# Patient Record
Sex: Female | Born: 1960
Health system: Southern US, Community
[De-identification: ages and names within clinical notes are randomized; demographics above are authoritative.]

## PROBLEM LIST (undated history)

## (undated) DIAGNOSIS — G500 Trigeminal neuralgia: Secondary | ICD-10-CM

## (undated) DIAGNOSIS — I10 Essential (primary) hypertension: Secondary | ICD-10-CM

## (undated) DIAGNOSIS — E7800 Pure hypercholesterolemia, unspecified: Secondary | ICD-10-CM

## (undated) DIAGNOSIS — K589 Irritable bowel syndrome without diarrhea: Secondary | ICD-10-CM

## (undated) DIAGNOSIS — J309 Allergic rhinitis, unspecified: Secondary | ICD-10-CM

## (undated) DIAGNOSIS — E119 Type 2 diabetes mellitus without complications: Secondary | ICD-10-CM

## (undated) DIAGNOSIS — J449 Chronic obstructive pulmonary disease, unspecified: Secondary | ICD-10-CM

## (undated) DIAGNOSIS — J302 Other seasonal allergic rhinitis: Secondary | ICD-10-CM

## (undated) DIAGNOSIS — Z860100 Personal history of colon polyps, unspecified: Secondary | ICD-10-CM

## (undated) DIAGNOSIS — K219 Gastro-esophageal reflux disease without esophagitis: Secondary | ICD-10-CM

## (undated) HISTORY — PX: KNEE SURGERY: SHX244

## (undated) HISTORY — DX: Personal history of colonic polyps: Z86.010

## (undated) HISTORY — PX: ROTATOR CUFF REPAIR: SHX139

## (undated) HISTORY — DX: Gastro-esophageal reflux disease without esophagitis: K21.9

## (undated) HISTORY — DX: Irritable bowel syndrome, unspecified: K58.9

## (undated) HISTORY — DX: Type 2 diabetes mellitus without complications: E11.9

## (undated) HISTORY — DX: Other seasonal allergic rhinitis: J30.2

## (undated) HISTORY — DX: Essential (primary) hypertension: I10

## (undated) HISTORY — PX: ELBOW SURGERY: SHX618

## (undated) HISTORY — DX: Allergic rhinitis, unspecified: J30.9

## (undated) HISTORY — DX: Chronic obstructive pulmonary disease, unspecified: J44.9

## (undated) HISTORY — PX: BREAST CYST ASPIRATION: SHX578

## (undated) HISTORY — DX: Pure hypercholesterolemia, unspecified: E78.00

## (undated) HISTORY — PX: ABDOMINAL HYSTERECTOMY: SHX81

## (undated) HISTORY — DX: Personal history of colon polyps, unspecified: Z86.0100

## (undated) HISTORY — DX: Trigeminal neuralgia: G50.0

## (undated) HISTORY — PX: GALLBLADDER SURGERY: SHX652

---

## 2005-07-14 ENCOUNTER — Encounter: Admission: RE | Admit: 2005-07-14 | Discharge: 2005-07-14 | Payer: Self-pay | Admitting: Gastroenterology

## 2009-04-15 ENCOUNTER — Ambulatory Visit (HOSPITAL_COMMUNITY): Admission: RE | Admit: 2009-04-15 | Discharge: 2009-04-15 | Payer: Self-pay | Admitting: Orthopedic Surgery

## 2013-10-03 DIAGNOSIS — G43909 Migraine, unspecified, not intractable, without status migrainosus: Secondary | ICD-10-CM | POA: Insufficient documentation

## 2013-10-03 DIAGNOSIS — G500 Trigeminal neuralgia: Secondary | ICD-10-CM | POA: Insufficient documentation

## 2013-11-12 ENCOUNTER — Encounter: Payer: Self-pay | Admitting: Critical Care Medicine

## 2013-11-12 ENCOUNTER — Ambulatory Visit (INDEPENDENT_AMBULATORY_CARE_PROVIDER_SITE_OTHER): Payer: Self-pay | Admitting: Critical Care Medicine

## 2013-11-12 VITALS — BP 110/70 | HR 109 | Temp 98.4°F | Ht 68.0 in | Wt 193.2 lb

## 2013-11-12 DIAGNOSIS — E119 Type 2 diabetes mellitus without complications: Secondary | ICD-10-CM | POA: Insufficient documentation

## 2013-11-12 DIAGNOSIS — R519 Headache, unspecified: Secondary | ICD-10-CM | POA: Insufficient documentation

## 2013-11-12 DIAGNOSIS — R51 Headache: Secondary | ICD-10-CM

## 2013-11-12 DIAGNOSIS — R059 Cough, unspecified: Secondary | ICD-10-CM

## 2013-11-12 DIAGNOSIS — E7800 Pure hypercholesterolemia, unspecified: Secondary | ICD-10-CM | POA: Insufficient documentation

## 2013-11-12 DIAGNOSIS — J984 Other disorders of lung: Secondary | ICD-10-CM

## 2013-11-12 DIAGNOSIS — J449 Chronic obstructive pulmonary disease, unspecified: Secondary | ICD-10-CM | POA: Insufficient documentation

## 2013-11-12 DIAGNOSIS — F17200 Nicotine dependence, unspecified, uncomplicated: Secondary | ICD-10-CM | POA: Insufficient documentation

## 2013-11-12 DIAGNOSIS — J302 Other seasonal allergic rhinitis: Secondary | ICD-10-CM

## 2013-11-12 DIAGNOSIS — Z720 Tobacco use: Secondary | ICD-10-CM | POA: Insufficient documentation

## 2013-11-12 DIAGNOSIS — I10 Essential (primary) hypertension: Secondary | ICD-10-CM | POA: Insufficient documentation

## 2013-11-12 DIAGNOSIS — R05 Cough: Secondary | ICD-10-CM

## 2013-11-12 DIAGNOSIS — J439 Emphysema, unspecified: Secondary | ICD-10-CM | POA: Insufficient documentation

## 2013-11-12 NOTE — Patient Instructions (Signed)
Stop smoking, use nicorette minis  6-8 per day No change in other medications Obtain a chest xray today We will call with results We can clear you for planned ovarian surgery, we will call your GYN MD Return PRN

## 2013-11-12 NOTE — Assessment & Plan Note (Signed)
Restrictive lung defect on basis of diaphragm elevation from ovarian mass and probable ascites.  No overt primary lung disease No indication for inhaled medications.  Active tobacco use for which pt was given > smoking cessation counseling. Plan Tobacco cessation with nicorette minis No inhaled meds needed Obtain CXR for completeness rov prn  The pt can be CLEARED for planned ovarian surgery per GYN.

## 2013-11-12 NOTE — Assessment & Plan Note (Signed)
>   smoking cessation counseling issued to the pt.

## 2013-11-12 NOTE — Progress Notes (Signed)
Subjective:    Patient ID: Brandy Hall, female    DOB: Nov 24, 1960, 53 y.o.   MRN: [id number]  HPI Comments: Pt needs ovarian mass removed.  Never dx lung dz.  No asthma dx. Pt notes mild dyspnea.  Exertion only. Notes abd swelling and increased girth over same period of time   Shortness of Breath This is a chronic problem. The current episode started more than 1 month ago. The problem occurs daily (exertion only 76ft, worse up incline or stairs.). The problem has been rapidly worsening. Associated symptoms include abdominal pain, chest pain, orthopnea, PND and sputum production. Pertinent negatives include no fever, hemoptysis, leg pain, leg swelling, rhinorrhea, sore throat, swollen glands, syncope, vomiting or wheezing. The symptoms are aggravated by any activity and lying flat. Associated symptoms comments: Notes clear to white mucus Notes GERD. Risk factors include smoking. She has tried nothing for the symptoms. There is no history of allergies, aspirin allergies, asthma, bronchiolitis, CAD, COPD, DVT, a heart failure, PE, pneumonia or a recent surgery.   Past Medical History  Diagnosis Date  . Hypertension   . Diabetes   . Hypercholesteremia   . Seasonal allergies   . Trigeminal neuralgia      No family history on file.   History   Social History  . Marital Status: Single    Spouse Name: N/A    Number of Children: N/A  . Years of Education: N/A   Occupational History  . Not on file.   Social History Main Topics  . Smoking status: Current Every Day Smoker -- 0.50 packs/day for 25 years    Types: Cigarettes  . Smokeless tobacco: Never Used  . Alcohol Use: No  . Drug Use: Not on file  . Sexual Activity: Not on file   Other Topics Concern  . Not on file   Social History Narrative   Single Lives with grandchild.   Retired     Allergies  Allergen Reactions  . Oxycodone-Acetaminophen Hives     No outpatient prescriptions prior to visit.   No  facility-administered medications prior to visit.       Review of Systems  Constitutional: Positive for fatigue. Negative for fever.  HENT: Negative for rhinorrhea, sore throat, trouble swallowing and voice change.   Respiratory: Positive for cough, sputum production, chest tightness and shortness of breath. Negative for apnea, hemoptysis and wheezing.   Cardiovascular: Positive for chest pain, orthopnea and PND. Negative for leg swelling and syncope.  Gastrointestinal: Positive for abdominal pain. Negative for vomiting.  Genitourinary: Positive for vaginal bleeding, vaginal discharge and pelvic pain.       Objective:   Physical Exam Filed Vitals:   11/12/13 0956  BP: 110/70  Pulse: 109  Temp: 98.4 F (36.9 C)  TempSrc: Oral  Height:  (1.727 m)  Weight: 193 lb 3.2 oz (87.635 kg)  SpO2: 97%    Gen: Pleasant, well-nourished, in no distress,  normal affect  ENT: No lesions,  mouth clear,  oropharynx clear, no postnasal drip  Neck: No JVD, no TMG, no carotid bruits  Lungs: No use of accessory muscles, no dullness to percussion, exp wheezes.  Cardiovascular: RRR, heart sounds normal, no murmur or gallops, no peripheral edema  Abdomen: distended, +++ascites, fullness in ovarian areas,  BS normal  Musculoskeletal: No deformities, no cyanosis or clubbing  Neuro: alert, non focal  Skin: Warm, no lesions or rashes  No results found.   11/12/2013 Cleda Daub 11/12/2013: FeV1 67% FVC  70% FeV1/FVC 98%      CXR 11/12/2013: pending       Assessment & Plan:   Restrictive lung disease Restrictive lung defect on basis of diaphragm elevation from ovarian mass and probable ascites.  No overt primary lung disease No indication for inhaled medications.  Active tobacco use for which pt was given > smoking cessation counseling. Plan Tobacco cessation with nicorette minis No inhaled meds needed Obtain CXR for completeness rov prn  The pt can be CLEARED for planned ovarian  surgery per GYN.   Tobacco use disorder > smoking cessation counseling issued to the pt.     Updated Medication List Outpatient Encounter Prescriptions as of 11/12/2013  Medication Sig  . Dapagliflozin Propanediol (FARXIGA) 10 MG TABS Take by mouth. Take by mouth 1 tablet every day  . lisinopril-hydrochlorothiazide (PRINZIDE,ZESTORETIC) 20-25 MG per tablet Take 1 tablet daily by mouth  . omeprazole (PRILOSEC) 20 MG capsule Take 1 capsule daily by mouth  . OXcarbazepine (TRILEPTAL) 150 MG tablet Take 150 mg by mouth. Take 1 tablet twice a day by mouth  . rosuvastatin (CRESTOR) 40 MG tablet Take 40 mg by mouth. Take 1 tablet by mouth every day  . SitaGLIPtin-MetFORMIN HCl (JANUMET XR) 50-1000 MG TB24 Take 1 tablet by mouth twice a day with meals  . nitrofurantoin (MACRODANTIN) 100 MG capsule Take 100 mg by mouth daily.

## 2013-11-15 ENCOUNTER — Telehealth: Payer: Self-pay | Admitting: Critical Care Medicine

## 2013-11-15 DIAGNOSIS — J849 Interstitial pulmonary disease, unspecified: Secondary | ICD-10-CM

## 2013-11-15 HISTORY — PX: OOPHORECTOMY: SHX86

## 2013-11-15 NOTE — Telephone Encounter (Signed)
lmomtcb on pt's home and cell #s 

## 2013-11-15 NOTE — Telephone Encounter (Signed)
Tried to call the pt, no answer  Her CXR shows increased interstitial lung disease  ? Cause?  Needs CT Chest with contrast to be complete

## 2013-11-18 NOTE — Telephone Encounter (Signed)
Called, spoke with pt -  Explained below to her.  She verbalized understanding and would like to proceed with CT. She would like to have this done at [facility] and can have it any day this week except Thursday. PCCs, please schedule.  Thank you.

## 2013-11-18 NOTE — Telephone Encounter (Signed)
LMTCB-need to make sure patient is aware of why she is needing the CT Chest and then send back to [facility] pool for them to complete order.

## 2013-11-18 NOTE — Telephone Encounter (Signed)
Called to schedule CT Chest for patient and pt states that she doesn't understand why she needs this CT Chest. Pt is requesting that nurse call her to explain why CT Chest is needed before I schedule this. [name]

## 2013-11-19 ENCOUNTER — Telehealth: Payer: Self-pay | Admitting: Critical Care Medicine

## 2013-11-19 DIAGNOSIS — J984 Other disorders of lung: Secondary | ICD-10-CM

## 2013-11-19 NOTE — Telephone Encounter (Signed)
Per 11/15/13 phone note: [name] at 11/18/2013 3:10 PM    Status: Signed       Called to schedule CT Chest for patient and pt states that she doesn't understand why she needs this CT Chest. Pt is requesting that nurse call her to explain why CT Chest is needed before I schedule this. [name]        Crystal Jyl Heinz, RN at 11/18/2013 9:47 AM     Status: Signed        Called, spoke with pt -  Explained below to her. She verbalized understanding and would like to proceed with CT.  She would like to have this done at [facility] and can have it any day this week except Thursday.  PCCs, please schedule. Thank you.    Pt is aware why she is needing this done. Please advise PCC thanks

## 2013-11-20 NOTE — Telephone Encounter (Signed)
Order sent to PCC 

## 2013-11-20 NOTE — Telephone Encounter (Signed)
NO constrast,  HRCT

## 2013-11-20 NOTE — Telephone Encounter (Signed)
Pt has been contacted and understands why the CT Chest has been ordered.  [facility] still need to know if Dr Delford Field wants the CT Chest with contrast or without contrast and High Res Cuts. The order that was placed by Dr. Delford Field is for CT Chest with contrast and high res cuts, however, the protocol for High Res Cuts is a CT Chest w/o contrast. [facility] needs clarification on CT Chest order before this is scheduled. Please advise. Thanks,. [name]

## 2013-11-20 NOTE — Telephone Encounter (Signed)
When patient returned [name]'s call another message was taken on 11/19/13. Pt's question was answered as to why she needed the CT Chest by [name].  [facility] still need to know how Dr. Delford Field wants this CT Chest done. Usually CT Chest High Res are ordered without contrast, however, Dr. Delford Field ordered this CT Chest with contrast and high res cuts. Clarification of order has been address in the 11/19/13 order and this phone note has been closed.  [name] Duplicate message. [name] See phone note on 11/19/13.

## 2013-11-20 NOTE — Telephone Encounter (Signed)
Dr. Delford Field, please advise.  Thank you.

## 2013-11-22 ENCOUNTER — Telehealth: Payer: Self-pay | Admitting: Critical Care Medicine

## 2013-11-22 NOTE — Telephone Encounter (Signed)
Order was already faxed by [facility] and confirmation received. Bjorn Loser, will you please assist with this and refax order?   Thanks for your help with this.

## 2013-11-22 NOTE — Telephone Encounter (Signed)
Called and spoke with Angelique Blonder at [facility] and she confirmed that order for pt's CT Chest on Monday has been received by Marchelle Folks. Nothing else needed at this time. Confirmation received x 2. [name]

## 2013-11-22 NOTE — Telephone Encounter (Signed)
Order was faxed yesterday to [phone] with confirmation that faxed was received. Called Marchelle Folks to advise of this and she stated that she didn't know what happened to order, but could I re-fax. I re-faxed order along with confirmation report to [phone] Attn: Marchelle Folks. Confirmation received again. [name]

## 2013-11-29 ENCOUNTER — Telehealth: Payer: Self-pay | Admitting: Critical Care Medicine

## 2013-11-29 ENCOUNTER — Encounter: Payer: Self-pay | Admitting: Critical Care Medicine

## 2013-11-29 DIAGNOSIS — J432 Centrilobular emphysema: Secondary | ICD-10-CM

## 2013-11-29 NOTE — Assessment & Plan Note (Signed)
CT Chest: mild centrilobular emphysema.  No ILD

## 2013-11-29 NOTE — Telephone Encounter (Signed)
Pt given CT Chest results, mild centrilobular emphysema only

## 2013-12-04 ENCOUNTER — Encounter: Payer: Self-pay | Admitting: Critical Care Medicine

## 2013-12-30 ENCOUNTER — Encounter: Payer: Self-pay | Admitting: Emergency Medicine

## 2013-12-30 ENCOUNTER — Telehealth: Payer: Self-pay | Admitting: Critical Care Medicine

## 2013-12-30 NOTE — Telephone Encounter (Signed)
Called and spoke to pt. Pt is requesting a letter from PW to the Department of Social Services in [place]. The letter is for financial help with the pt's power bill. The letter will need to include-- in mild temperatures, heating and cooling is needed for COPD and early stages of emphysema.  Pt stated the letter is needed by 5pm 10/27. Pt last seen by PW on 11/12/2013.   Fax: [phone] Phone: [phone]  PW please advise.

## 2013-12-30 NOTE — Telephone Encounter (Signed)
i am ok with producing this letter

## 2013-12-30 NOTE — Telephone Encounter (Signed)
If someone will type this up i will sign

## 2013-12-30 NOTE — Telephone Encounter (Signed)
Letter signed by PW and given to triage.  Thank you for helping with this.

## 2013-12-30 NOTE — Telephone Encounter (Signed)
Pt called again, states that she received a call from Lupita Leashonna with [employer] of [place]. Pt states that she was advised that they have to have this letter back today or it will not be accepted. Letter needs to state/include as follows : in mild temperatures, heating and cooling is needed for COPD and early stages of emphysema.  Please advise Dr Delford FieldWright. Thanks.

## 2013-12-30 NOTE — Telephone Encounter (Signed)
Letter generated. Signed by PW. Faxed to provided number.  Pt aware. Nothing further needed.

## 2013-12-30 NOTE — Telephone Encounter (Signed)
Pt is calling back & asks to speak w/ the nurse again regarding this same issues & can be reached at [phone].  Brandy Hall

## 2014-02-06 HISTORY — PX: ESOPHAGOGASTRODUODENOSCOPY: SHX1529

## 2014-02-12 ENCOUNTER — Other Ambulatory Visit: Payer: Self-pay | Admitting: Gastroenterology

## 2014-02-12 DIAGNOSIS — Z1211 Encounter for screening for malignant neoplasm of colon: Secondary | ICD-10-CM

## 2014-02-24 ENCOUNTER — Ambulatory Visit
Admission: RE | Admit: 2014-02-24 | Discharge: 2014-02-24 | Disposition: A | Discharge status: Home / Self Care | Payer: BC Managed Care – PPO | Source: Ambulatory Visit | Attending: Gastroenterology | Admitting: Gastroenterology

## 2014-02-24 DIAGNOSIS — Z1211 Encounter for screening for malignant neoplasm of colon: Secondary | ICD-10-CM

## 2014-03-07 DIAGNOSIS — I219 Acute myocardial infarction, unspecified: Secondary | ICD-10-CM

## 2014-03-07 HISTORY — DX: Acute myocardial infarction, unspecified: I21.9

## 2014-07-30 DIAGNOSIS — I25119 Atherosclerotic heart disease of native coronary artery with unspecified angina pectoris: Secondary | ICD-10-CM | POA: Insufficient documentation

## 2014-07-30 DIAGNOSIS — I208 Other forms of angina pectoris: Secondary | ICD-10-CM | POA: Insufficient documentation

## 2014-07-30 DIAGNOSIS — I2510 Atherosclerotic heart disease of native coronary artery without angina pectoris: Secondary | ICD-10-CM | POA: Insufficient documentation

## 2014-09-03 ENCOUNTER — Ambulatory Visit: Payer: BC Managed Care – PPO | Admitting: Critical Care Medicine

## 2014-09-15 HISTORY — PX: COLONOSCOPY: SHX174

## 2014-09-30 ENCOUNTER — Encounter: Payer: Self-pay | Admitting: Critical Care Medicine

## 2014-09-30 ENCOUNTER — Ambulatory Visit (INDEPENDENT_AMBULATORY_CARE_PROVIDER_SITE_OTHER): Payer: BC Managed Care – PPO | Admitting: Critical Care Medicine

## 2014-09-30 VITALS — BP 114/76 | HR 88 | Temp 99.1°F | Ht 68.0 in | Wt 189.2 lb

## 2014-09-30 DIAGNOSIS — F17200 Nicotine dependence, unspecified, uncomplicated: Secondary | ICD-10-CM

## 2014-09-30 DIAGNOSIS — J432 Centrilobular emphysema: Secondary | ICD-10-CM

## 2014-09-30 DIAGNOSIS — J449 Chronic obstructive pulmonary disease, unspecified: Secondary | ICD-10-CM

## 2014-09-30 DIAGNOSIS — J209 Acute bronchitis, unspecified: Secondary | ICD-10-CM

## 2014-09-30 DIAGNOSIS — J441 Chronic obstructive pulmonary disease with (acute) exacerbation: Secondary | ICD-10-CM

## 2014-09-30 MED ORDER — PREDNISONE 10 MG PO TABS: ORAL_TABLET | ORAL | Status: DC

## 2014-09-30 MED ORDER — MOMETASONE FURO-FORMOTEROL FUM 200-5 MCG/ACT IN AERO: 2.0000 | INHALATION_SPRAY | Freq: Two times a day (BID) | RESPIRATORY_TRACT | Status: DC

## 2014-09-30 NOTE — Assessment & Plan Note (Signed)
Chronic obstructive lung disease with emphysematous component and now asthmatic bronchitic component and flare with ongoing tobacco use Note previous lung function test only showed restriction but now the patient has more obstruction physiology on exam Acute tracheobronchitis also seen Plan Begin Dulera 200  2 puff twice daily Pulse prednisone was given Smoking cessation was advised

## 2014-09-30 NOTE — Progress Notes (Signed)
Subjective:    Patient ID: Brandy Hall, female    DOB: 14-May-1960, 54 y.o.   MRN: [id number]  HPI 09/30/2014 Chief Complaint  Patient presents with  . Follow-up    Ref. back from Dr. Curlene Dolphin lung disease worse.Sob-with exertion,worse with humidity,cough-dk. yellow,constant coughing all day,keeps awake at night,wheezing,chest tightness and pain occass.,sweats.  Still with dyspnea.  Cough is productive, dark yellow and clear.  Pt is wheezing  Pt still smoking 10cigs per day Just on macrodantin for one week  Pt denies any significant sore throat, nasal congestion or excess secretions, fever, chills, sweats, unintended weight loss, pleurtic or exertional chest pain, orthopnea PND, or leg swelling Pt denies any increase in rescue therapy over baseline, denies waking up needing it or having any early am or nocturnal exacerbations of coughing/wheezing/or dyspnea. Pt also denies any obvious fluctuation in symptoms with  weather or environmental change or other alleviating or aggravating factors   Current Medications, Allergies, Complete Past Medical History, Past Surgical History, Family History, and Social History were reviewed in [employer] electronic medical record per todays encounter:  09/30/2014  Review of Systems  Constitutional: Negative.   HENT: Negative.  Negative for ear pain, postnasal drip, rhinorrhea, sinus pressure, sore throat, trouble swallowing and voice change.   Eyes: Negative.   Respiratory: Positive for cough, chest tightness, shortness of breath and wheezing. Negative for apnea, choking and stridor.   Cardiovascular: Negative.  Negative for chest pain, palpitations and leg swelling.  Gastrointestinal: Negative.  Negative for nausea, vomiting, abdominal pain and abdominal distention.  Genitourinary: Negative.   Musculoskeletal: Negative.  Negative for myalgias and arthralgias.  Skin: Negative.  Negative for rash.  Allergic/Immunologic: Negative.   Negative for environmental allergies and food allergies.  Neurological: Negative.  Negative for dizziness, syncope, weakness and headaches.  Hematological: Negative.  Negative for adenopathy. Does not bruise/bleed easily.  Psychiatric/Behavioral: Negative.  Negative for sleep disturbance and agitation. The patient is not nervous/anxious.        Objective:   Physical Exam Filed Vitals:   09/30/14 1213  BP: 114/76  Pulse: 88  Temp: 99.1 F (37.3 C)  TempSrc: Oral  Height:  (1.727 m)  Weight: 189 lb 3.2 oz (85.821 kg)  SpO2: 97%    Gen: Pleasant, well-nourished, in no distress,  normal affect  ENT: No lesions,  mouth clear,  oropharynx clear, no postnasal drip  Neck: No JVD, no TMG, no carotid bruits  Lungs: No use of accessory muscles, no dullness to percussion, insp expir wheezes, poor airflow  Cardiovascular: RRR, heart sounds normal, no murmur or gallops, no peripheral edema  Abdomen: soft and NT, no HSM,  BS normal  Musculoskeletal: No deformities, no cyanosis or clubbing  Neuro: alert, non focal  Skin: Warm, no lesions or rashes  No results found.        Assessment & Plan:  I personally reviewed all images and lab data in the [facility] system as well as any outside material available during this office visit and agree with the  radiology impressions.   COPD with emphysema Chronic obstructive lung disease with emphysematous component and now asthmatic bronchitic component and flare with ongoing tobacco use Note previous lung function test only showed restriction but now the patient has more obstruction physiology on exam Acute tracheobronchitis also seen Plan Begin Dulera 200  2 puff twice daily Pulse prednisone was given Smoking cessation was advised  Tobacco use disorder 3-10 minutes smoking cessation counseling given to the patient  Verbie was seen today for follow-up.  Diagnoses and all orders for this visit:  COPD with chronic  bronchitis  Acute bronchitis, unspecified organism  COPD exacerbation  Centrilobular emphysema  Tobacco use disorder  Other orders -     mometasone-formoterol (DULERA) 200-5 MCG/ACT AERO; Inhale 2 puffs into the lungs 2 (two) times daily. -     predniSONE (DELTASONE) 10 MG tablet; Take 4 for two days three for two days two for two days one for two days    I had an extended discussion with the patient and or family lasting 10 minutes of a 25 minute visit including:  Dx, tx options

## 2014-09-30 NOTE — Assessment & Plan Note (Signed)
3-10 minutes smoking cessation counseling given to the patient

## 2014-09-30 NOTE — Patient Instructions (Signed)
Prednisone  Take 4 for two days three for two days two for two days one for two days Start Dulera 200 two puff twice daily Albuterol as needed Return 1 month

## 2014-10-28 ENCOUNTER — Encounter: Payer: Self-pay | Admitting: Critical Care Medicine

## 2014-10-28 ENCOUNTER — Ambulatory Visit (INDEPENDENT_AMBULATORY_CARE_PROVIDER_SITE_OTHER): Payer: BC Managed Care – PPO | Admitting: Critical Care Medicine

## 2014-10-28 VITALS — BP 134/70 | HR 100 | Temp 98.4°F | Ht 68.0 in | Wt 189.0 lb

## 2014-10-28 DIAGNOSIS — F17200 Nicotine dependence, unspecified, uncomplicated: Secondary | ICD-10-CM

## 2014-10-28 DIAGNOSIS — G479 Sleep disorder, unspecified: Secondary | ICD-10-CM | POA: Insufficient documentation

## 2014-10-28 DIAGNOSIS — J449 Chronic obstructive pulmonary disease, unspecified: Secondary | ICD-10-CM

## 2014-10-28 DIAGNOSIS — R002 Palpitations: Secondary | ICD-10-CM | POA: Insufficient documentation

## 2014-10-28 DIAGNOSIS — J432 Centrilobular emphysema: Secondary | ICD-10-CM

## 2014-10-28 DIAGNOSIS — G4730 Sleep apnea, unspecified: Secondary | ICD-10-CM | POA: Insufficient documentation

## 2014-10-28 DIAGNOSIS — E785 Hyperlipidemia, unspecified: Secondary | ICD-10-CM | POA: Insufficient documentation

## 2014-10-28 DIAGNOSIS — M1990 Unspecified osteoarthritis, unspecified site: Secondary | ICD-10-CM | POA: Insufficient documentation

## 2014-10-28 NOTE — Patient Instructions (Addendum)
Primary focus needs to be smoking cessation Follow up with cardiology testing Stay on Dulera two puff twice daily, RN will go over with you technique again Return 6 months

## 2014-10-28 NOTE — Assessment & Plan Note (Signed)
Chronic obstructive lung disease with primary emphysematous component and ongoing tobacco use  Plan  Patient really needs to focus on smoking cessation  Continue Dulera 2 puff twice daily  Secondary to adverse side effects from cortical steroid will avoid further prednisone in this patient

## 2014-10-28 NOTE — Progress Notes (Signed)
   Subjective:    Patient ID: Brandy Hall, female    DOB: [birth year], 54 y.o.   MRN: [id number]  HPI 10/28/2014 Chief Complaint  Patient presents with  . Follow-up    C/o runny nose-clear,feels like a "cold" all the time,cough-yellow,sob worse with exertion,worse with humidity,midchest tightness,scheduled for a stress test in 1 wk,had a lowgrade fever,not sleeping well  Pt notes more runny nose and cough.  Notes more yellow mucus.  Notes some wheezing.  Low grade temp.  Notes some chest pain and tightness  Worse in the heat.  No GERD  Current Medications, Allergies, Complete Past Medical History, Past Surgical History, Family History, and Social History were reviewed in [employer] electronic medical record per todays encounter:  10/28/2014  Review of Systems  Constitutional: Positive for fever and fatigue.  HENT: Positive for postnasal drip, rhinorrhea, sinus pressure and sneezing. Negative for ear pain, sore throat, trouble swallowing and voice change.   Eyes: Negative.   Respiratory: Positive for cough, chest tightness, shortness of breath and wheezing. Negative for apnea, choking and stridor.   Cardiovascular: Negative.  Negative for chest pain, palpitations and leg swelling.  Gastrointestinal: Negative.  Negative for nausea, vomiting, abdominal pain and abdominal distention.  Genitourinary: Negative.   Musculoskeletal: Negative.  Negative for myalgias and arthralgias.  Skin: Negative.  Negative for rash.  Allergic/Immunologic: Negative.  Negative for environmental allergies and food allergies.  Neurological: Negative.  Negative for dizziness, syncope, weakness and headaches.  Hematological: Negative.  Negative for adenopathy. Does not bruise/bleed easily.  Psychiatric/Behavioral: Negative.  Negative for sleep disturbance and agitation. The patient is not nervous/anxious.        Objective:   Physical Exam Filed Vitals:   10/28/14 1448  BP: 134/70  Pulse: 100  Temp:  98.4 F (36.9 C)  TempSrc: Oral  Height:  (1.727 m)  Weight: 189 lb (85.73 kg)  SpO2: 95%    Gen: Pleasant, well-nourished, in no distress,  normal affect  ENT: No lesions,  mouth clear,  oropharynx clear, no postnasal drip  Neck: No JVD, no TMG, no carotid bruits  Lungs: No use of accessory muscles, no dullness to percussion,  Distant breath sounds  Cardiovascular: RRR, heart sounds normal, no murmur or gallops, no peripheral edema  Abdomen: soft and NT, no HSM,  BS normal  Musculoskeletal: No deformities, no cyanosis or clubbing  Neuro: alert, non focal  Skin: Warm, no lesions or rashes  No results found.        Assessment & Plan:  I personally reviewed all images and lab data in the [facility] system as well as any outside material available during this office visit and agree with the  radiology impressions.   COPD with emphysema  Chronic obstructive lung disease with primary emphysematous component and ongoing tobacco use  Plan  Patient really needs to focus on smoking cessation  Continue Dulera 2 puff twice daily  Secondary to adverse side effects from cortical steroid will avoid further prednisone in this patient    Hadas was seen today for follow-up.  Diagnoses and all orders for this visit:  Tobacco use disorder  COPD with chronic bronchitis  Centrilobular emphysema

## 2015-04-27 ENCOUNTER — Ambulatory Visit: Payer: BC Managed Care – PPO | Admitting: Internal Medicine

## 2015-04-28 IMAGING — CT CT VIRTUAL COLONOSCOPY SCREENING
3 of 7 series · 11 of 36 positions shown, 17 images · non-contrast
Comparison: CT abdomen pelvis of 09/02/2013

CLINICAL DATA: Incomplete colonoscopy on 02/07/2014, left-sided
abdominal pain and nausea

EXAM:
CT VIRTUAL COLONOSCOPY SCREENING
TECHNIQUE: The patient was given a standard magnesium citrate and suppositories
bowel preparation with Gastrografin and barium for fluid and stool
tagging respectively. The quality of the bowel preparation is
moderate. Automated CO2 insufflation of the colon was performed
prior to image acquisition and colonic distention is moderate to
poor. Image post processing was used to generate a 3D endoluminal
fly-through projection of the colon and to electronically subtract
stool/fluid as appropriate.

[Series 6: prone (id) · axial · 0.78mm/px · z∈[-410,-21]mm · 8 of 401 slices shown, 13 images (1 of 2)]
[im 45/401  soft-tissue]
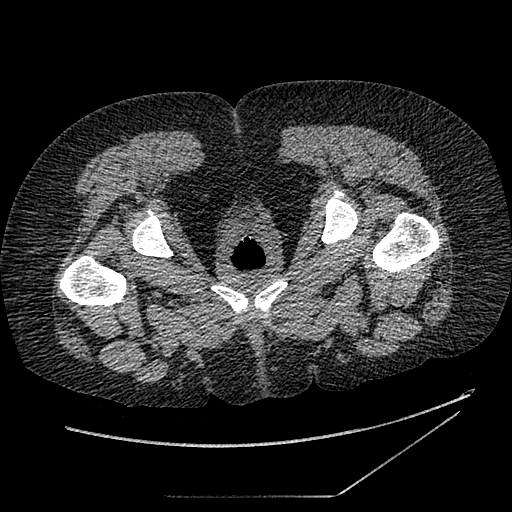
[im 45/401  bone]
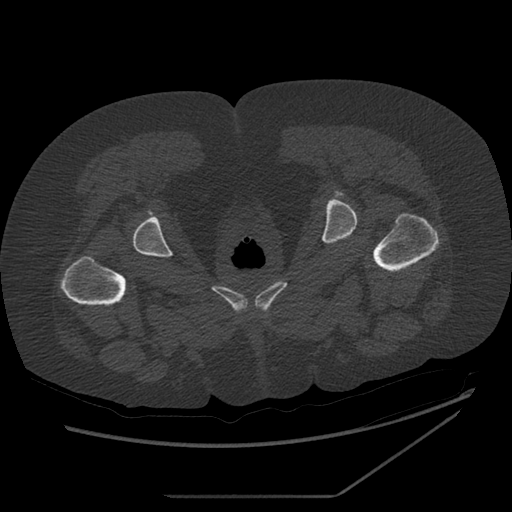
[im 89/401  soft-tissue]
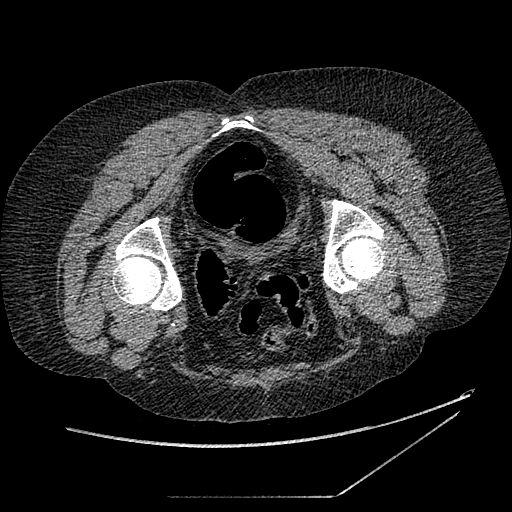
[im 134/401  soft-tissue]
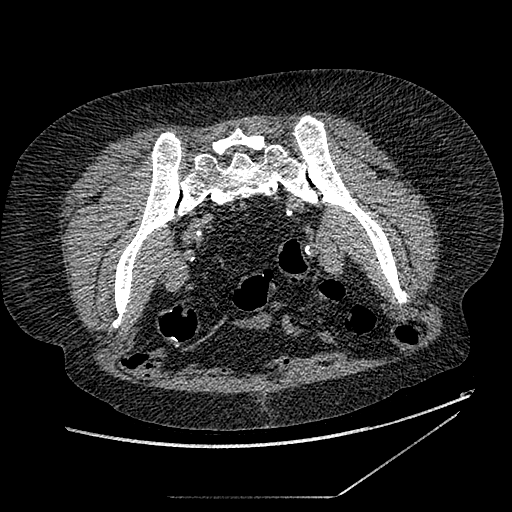
[im 178/401  soft-tissue]
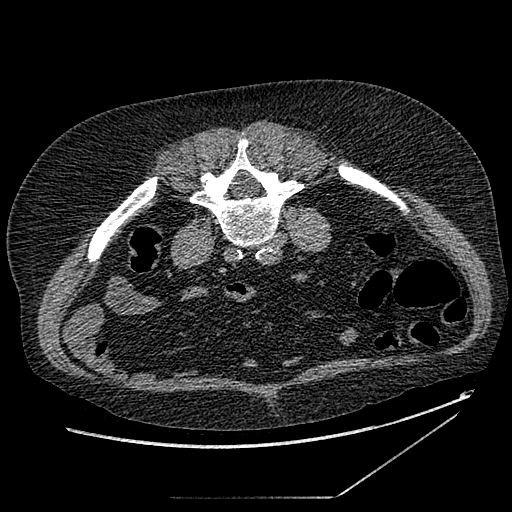
[im 223/401  soft-tissue]
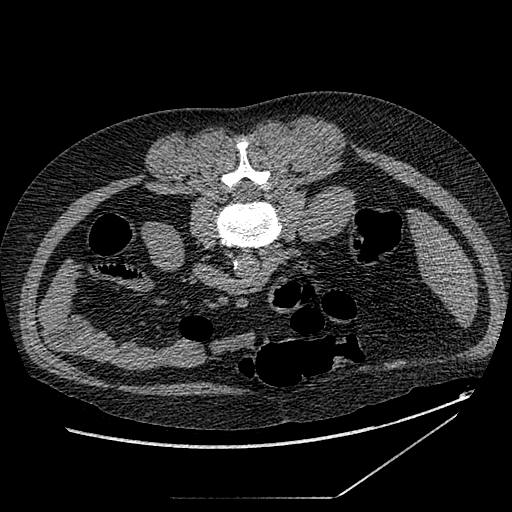
[im 223/401  lung]
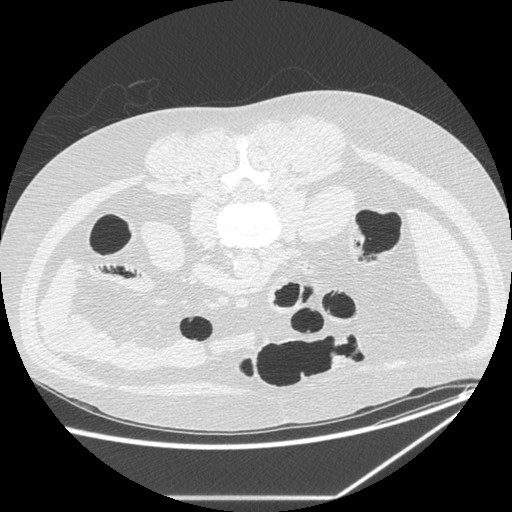
[im 267/401  soft-tissue]
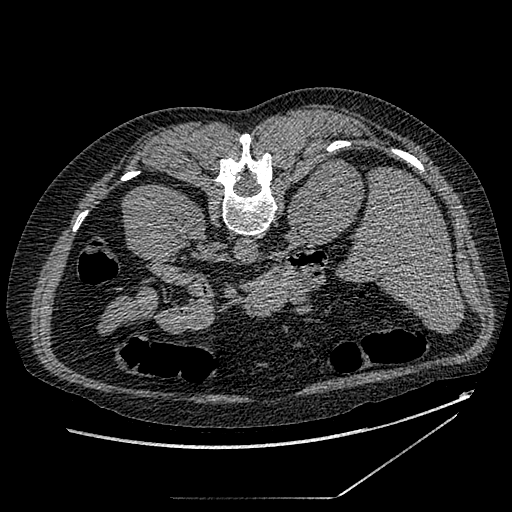
[im 267/401  lung]
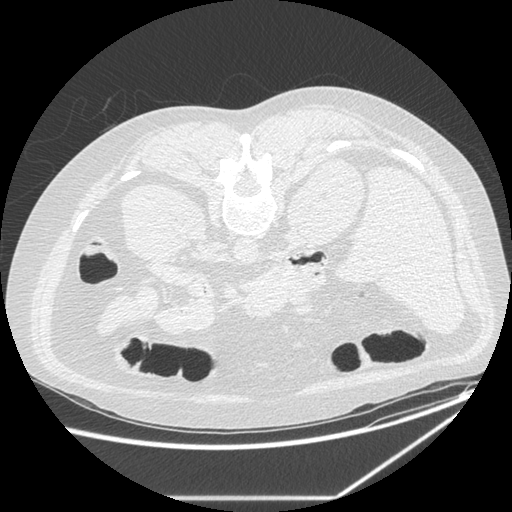
[im 312/401  soft-tissue]
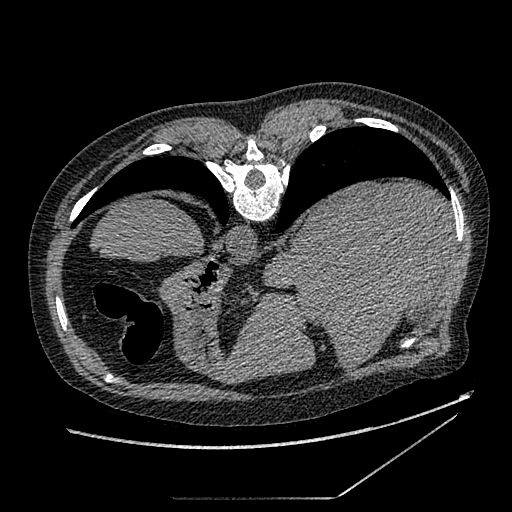
[im 312/401  lung]
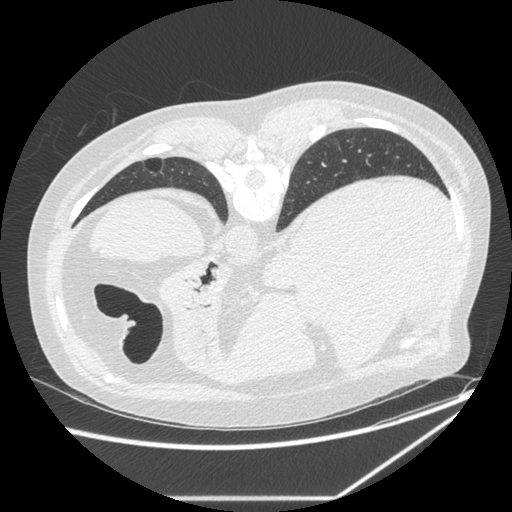
[im 356/401  soft-tissue]
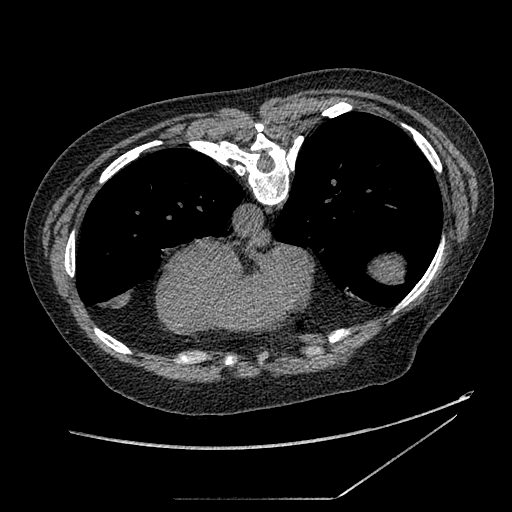
[im 356/401  lung]
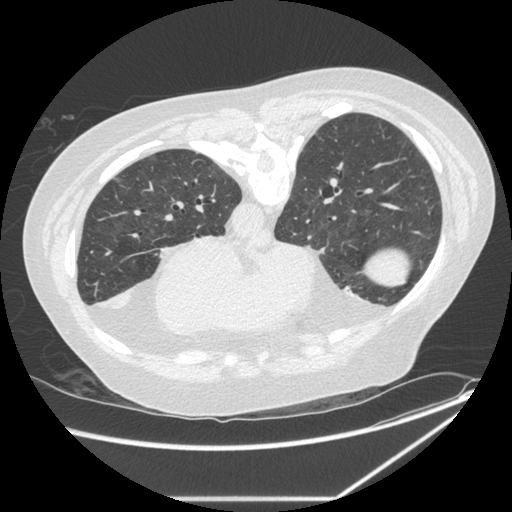

[Series 7: prone (id) · axial · 0.78mm/px · z∈[-340,-215]mm · 2 of 201 slices shown (2 of 2)]
[im 51/201  soft-tissue]
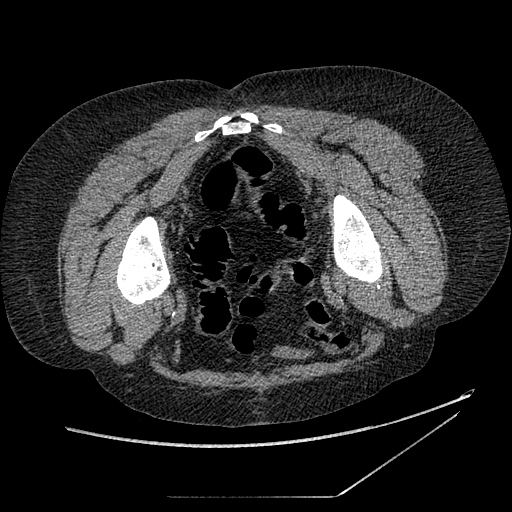
[im 101/201  soft-tissue]
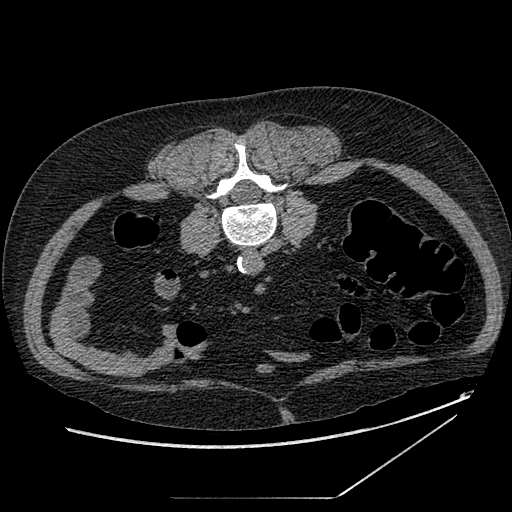

[Series 601: coronal body · coronal · 0.88mm/px · 1 of 143 slices shown, 2 images]
[im 48/143  soft-tissue]
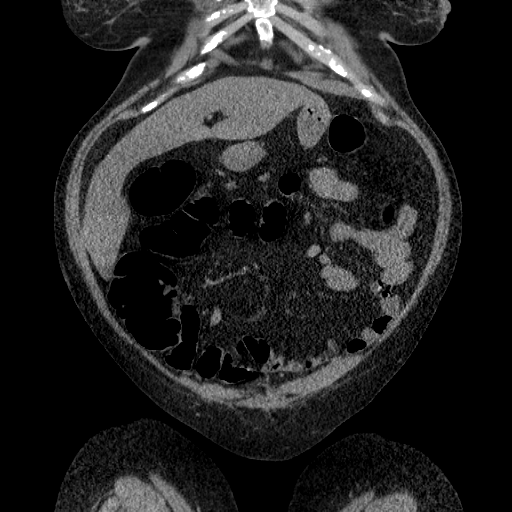
[im 48/143  bone]
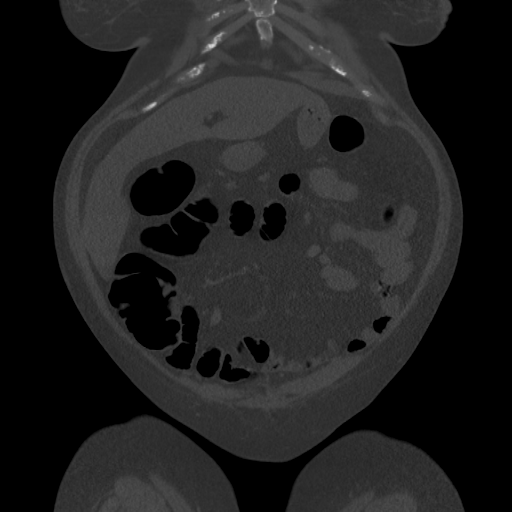

[11 of 36 positions shown; findings below may reference images not displayed]

FINDINGS: Both 2D and 3D images were reviewed in supine and prone positions.
No clinically significant polypoid lesion is seen. There are
multiple diverticula throughout the rectosigmoid and descending
colon with mild diverticulosis of the rectosigmoid colon. There are
a few tiny polypoid lesions none of which are clinically
significant. At 32 cm from the anal verge in the rectosigmoid colon
there is a 3 mm polypoid lesion present. At 122 cm from the anal
verge within the proximal transverse colon there is a questionable 4
mm polyp present. At 127 cm from the anal verge in the more proximal
transverse colon there is a 5 mm polyp present. No constricting
lesion is seen. Due to the diverticulosis of the rectosigmoid colon
that region is suboptimally distended but comparing supine and prone
images, no clinically significant abnormality is seen. The ileocecal
valve is unremarkable.
IMPRESSION: 1. Only a single 5 mm polypoid lesion is noted at 127 cm from the
anal verge within the proximal transverse colon. Additional polyps
of only 3 and 4 mm are noted as described above.
2. Multiple diverticula in the rectosigmoid and descending colon
with diverticulosis of the rectosigmoid colon limiting evaluation.

Virtual colonoscopy is not designed to detect diminutive polyps
(i.e., less than or equal to 5 mm), the presence or absence of which
may not affect clinical management.

CT ABDOMEN AND PELVIS WITHOUT CONTRAST
FINDINGS: The lung bases are clear. The heart is within upper limits
of normal. The liver is unremarkable in the unenhanced state.
Multiple surgical clips are present from prior cholecystectomy. The
pancreas is normal in size and the pancreatic duct is not dilated.
The adrenal glands and spleen are unremarkable. There is a
low-attenuation structure emanating from the upper pole of the left
kidney most consistent with a cyst, difficult to assess on this
unenhanced study. No renal calculi are seen and no hydronephrosis is
noted. Atheromatous change is noted throughout the abdominal aorta
but no aneurysm is seen. No adenopathy is noted.

The urinary bladder is decompressed. The uterus has previously been
resected. No adnexal lesion is seen. No fluid is noted within the
pelvis. Multiple rectosigmoid and descending colon diverticula again
are noted. The appendix is unremarkable as is the terminal ileum.
The lumbar vertebrae are in normal alignment with normal
intervertebral disc spaces.

1. Multiple rectosigmoid and descending colon diverticula.
2. Probable left upper pole renal cysts, difficult to assess without
IV contrast media.
3. The appendix and terminal ileum are unremarkable.

## 2015-05-19 DIAGNOSIS — I25119 Atherosclerotic heart disease of native coronary artery with unspecified angina pectoris: Secondary | ICD-10-CM | POA: Diagnosis not present

## 2015-05-19 DIAGNOSIS — Z6827 Body mass index (BMI) 27.0-27.9, adult: Secondary | ICD-10-CM | POA: Diagnosis not present

## 2015-05-19 DIAGNOSIS — G4733 Obstructive sleep apnea (adult) (pediatric): Secondary | ICD-10-CM | POA: Diagnosis not present

## 2015-05-19 DIAGNOSIS — G4700 Insomnia, unspecified: Secondary | ICD-10-CM | POA: Diagnosis not present

## 2015-05-19 DIAGNOSIS — G500 Trigeminal neuralgia: Secondary | ICD-10-CM | POA: Diagnosis not present

## 2015-05-26 DIAGNOSIS — L720 Epidermal cyst: Secondary | ICD-10-CM | POA: Diagnosis not present

## 2015-05-26 DIAGNOSIS — R110 Nausea: Secondary | ICD-10-CM | POA: Diagnosis not present

## 2015-05-26 DIAGNOSIS — K580 Irritable bowel syndrome with diarrhea: Secondary | ICD-10-CM | POA: Diagnosis not present

## 2015-05-26 DIAGNOSIS — E1165 Type 2 diabetes mellitus with hyperglycemia: Secondary | ICD-10-CM | POA: Diagnosis not present

## 2015-06-30 DIAGNOSIS — R110 Nausea: Secondary | ICD-10-CM | POA: Diagnosis not present

## 2015-06-30 DIAGNOSIS — K580 Irritable bowel syndrome with diarrhea: Secondary | ICD-10-CM | POA: Diagnosis not present

## 2015-06-30 DIAGNOSIS — I10 Essential (primary) hypertension: Secondary | ICD-10-CM | POA: Diagnosis not present

## 2015-06-30 DIAGNOSIS — E1165 Type 2 diabetes mellitus with hyperglycemia: Secondary | ICD-10-CM | POA: Diagnosis not present

## 2015-06-30 DIAGNOSIS — R350 Frequency of micturition: Secondary | ICD-10-CM | POA: Diagnosis not present

## 2015-06-30 DIAGNOSIS — E7800 Pure hypercholesterolemia, unspecified: Secondary | ICD-10-CM | POA: Diagnosis not present

## 2015-07-27 ENCOUNTER — Telehealth: Payer: Self-pay | Admitting: Critical Care Medicine

## 2015-07-27 NOTE — Telephone Encounter (Signed)
Spoke with pt. She has not seen us since August. Pt used to see PW in [place]. I advised her that we would need to see her and she could not make an appointment due to child care. Advised her since we have not seen her recently, MW would probably not treat her over the phone. She understood and stated that she would call her PCP. Nothing further was needed.

## 2015-08-27 DIAGNOSIS — Z0000 Encounter for general adult medical examination without abnormal findings: Secondary | ICD-10-CM | POA: Diagnosis not present

## 2015-08-27 DIAGNOSIS — K580 Irritable bowel syndrome with diarrhea: Secondary | ICD-10-CM | POA: Diagnosis not present

## 2015-08-27 DIAGNOSIS — J449 Chronic obstructive pulmonary disease, unspecified: Secondary | ICD-10-CM | POA: Diagnosis not present

## 2015-08-27 DIAGNOSIS — Z1239 Encounter for other screening for malignant neoplasm of breast: Secondary | ICD-10-CM | POA: Diagnosis not present

## 2015-08-27 DIAGNOSIS — I9589 Other hypotension: Secondary | ICD-10-CM | POA: Diagnosis not present

## 2015-08-27 DIAGNOSIS — E1165 Type 2 diabetes mellitus with hyperglycemia: Secondary | ICD-10-CM | POA: Diagnosis not present

## 2015-08-27 DIAGNOSIS — I10 Essential (primary) hypertension: Secondary | ICD-10-CM | POA: Diagnosis not present

## 2015-09-15 DIAGNOSIS — R002 Palpitations: Secondary | ICD-10-CM | POA: Diagnosis not present

## 2015-09-15 DIAGNOSIS — R079 Chest pain, unspecified: Secondary | ICD-10-CM | POA: Diagnosis not present

## 2015-09-15 DIAGNOSIS — Z6829 Body mass index (BMI) 29.0-29.9, adult: Secondary | ICD-10-CM | POA: Diagnosis not present

## 2015-09-24 DIAGNOSIS — M9904 Segmental and somatic dysfunction of sacral region: Secondary | ICD-10-CM | POA: Diagnosis not present

## 2015-09-24 DIAGNOSIS — M5134 Other intervertebral disc degeneration, thoracic region: Secondary | ICD-10-CM | POA: Diagnosis not present

## 2015-09-24 DIAGNOSIS — M5387 Other specified dorsopathies, lumbosacral region: Secondary | ICD-10-CM | POA: Diagnosis not present

## 2015-09-24 DIAGNOSIS — M9902 Segmental and somatic dysfunction of thoracic region: Secondary | ICD-10-CM | POA: Diagnosis not present

## 2015-09-24 DIAGNOSIS — M9903 Segmental and somatic dysfunction of lumbar region: Secondary | ICD-10-CM | POA: Diagnosis not present

## 2015-09-28 DIAGNOSIS — R079 Chest pain, unspecified: Secondary | ICD-10-CM | POA: Diagnosis not present

## 2015-09-28 DIAGNOSIS — I10 Essential (primary) hypertension: Secondary | ICD-10-CM | POA: Diagnosis not present

## 2015-09-28 DIAGNOSIS — R002 Palpitations: Secondary | ICD-10-CM | POA: Diagnosis not present

## 2015-09-29 DIAGNOSIS — M9902 Segmental and somatic dysfunction of thoracic region: Secondary | ICD-10-CM | POA: Diagnosis not present

## 2015-09-29 DIAGNOSIS — M9904 Segmental and somatic dysfunction of sacral region: Secondary | ICD-10-CM | POA: Diagnosis not present

## 2015-09-29 DIAGNOSIS — M5134 Other intervertebral disc degeneration, thoracic region: Secondary | ICD-10-CM | POA: Diagnosis not present

## 2015-09-29 DIAGNOSIS — R002 Palpitations: Secondary | ICD-10-CM | POA: Diagnosis not present

## 2015-09-29 DIAGNOSIS — M9903 Segmental and somatic dysfunction of lumbar region: Secondary | ICD-10-CM | POA: Diagnosis not present

## 2015-09-29 DIAGNOSIS — M5387 Other specified dorsopathies, lumbosacral region: Secondary | ICD-10-CM | POA: Diagnosis not present

## 2015-09-30 DIAGNOSIS — M5387 Other specified dorsopathies, lumbosacral region: Secondary | ICD-10-CM | POA: Diagnosis not present

## 2015-09-30 DIAGNOSIS — M9903 Segmental and somatic dysfunction of lumbar region: Secondary | ICD-10-CM | POA: Diagnosis not present

## 2015-09-30 DIAGNOSIS — M9902 Segmental and somatic dysfunction of thoracic region: Secondary | ICD-10-CM | POA: Diagnosis not present

## 2015-09-30 DIAGNOSIS — M5134 Other intervertebral disc degeneration, thoracic region: Secondary | ICD-10-CM | POA: Diagnosis not present

## 2015-09-30 DIAGNOSIS — M9904 Segmental and somatic dysfunction of sacral region: Secondary | ICD-10-CM | POA: Diagnosis not present

## 2015-10-05 DIAGNOSIS — M5134 Other intervertebral disc degeneration, thoracic region: Secondary | ICD-10-CM | POA: Diagnosis not present

## 2015-10-05 DIAGNOSIS — M9904 Segmental and somatic dysfunction of sacral region: Secondary | ICD-10-CM | POA: Diagnosis not present

## 2015-10-05 DIAGNOSIS — M5387 Other specified dorsopathies, lumbosacral region: Secondary | ICD-10-CM | POA: Diagnosis not present

## 2015-10-05 DIAGNOSIS — M9903 Segmental and somatic dysfunction of lumbar region: Secondary | ICD-10-CM | POA: Diagnosis not present

## 2015-10-05 DIAGNOSIS — M9902 Segmental and somatic dysfunction of thoracic region: Secondary | ICD-10-CM | POA: Diagnosis not present

## 2015-10-07 DIAGNOSIS — M9904 Segmental and somatic dysfunction of sacral region: Secondary | ICD-10-CM | POA: Diagnosis not present

## 2015-10-07 DIAGNOSIS — M9903 Segmental and somatic dysfunction of lumbar region: Secondary | ICD-10-CM | POA: Diagnosis not present

## 2015-10-07 DIAGNOSIS — M9902 Segmental and somatic dysfunction of thoracic region: Secondary | ICD-10-CM | POA: Diagnosis not present

## 2015-10-07 DIAGNOSIS — M5134 Other intervertebral disc degeneration, thoracic region: Secondary | ICD-10-CM | POA: Diagnosis not present

## 2015-10-07 DIAGNOSIS — M5387 Other specified dorsopathies, lumbosacral region: Secondary | ICD-10-CM | POA: Diagnosis not present

## 2015-10-08 DIAGNOSIS — J449 Chronic obstructive pulmonary disease, unspecified: Secondary | ICD-10-CM | POA: Diagnosis not present

## 2015-10-08 DIAGNOSIS — I10 Essential (primary) hypertension: Secondary | ICD-10-CM | POA: Diagnosis not present

## 2015-10-08 DIAGNOSIS — E119 Type 2 diabetes mellitus without complications: Secondary | ICD-10-CM | POA: Diagnosis not present

## 2015-10-28 DIAGNOSIS — R002 Palpitations: Secondary | ICD-10-CM | POA: Diagnosis not present

## 2015-11-05 DIAGNOSIS — M79601 Pain in right arm: Secondary | ICD-10-CM | POA: Diagnosis not present

## 2015-11-05 DIAGNOSIS — Z1239 Encounter for other screening for malignant neoplasm of breast: Secondary | ICD-10-CM | POA: Diagnosis not present

## 2015-11-10 DIAGNOSIS — I2510 Atherosclerotic heart disease of native coronary artery without angina pectoris: Secondary | ICD-10-CM | POA: Diagnosis not present

## 2015-11-10 DIAGNOSIS — Z6829 Body mass index (BMI) 29.0-29.9, adult: Secondary | ICD-10-CM | POA: Diagnosis not present

## 2015-11-25 DIAGNOSIS — I10 Essential (primary) hypertension: Secondary | ICD-10-CM | POA: Diagnosis not present

## 2015-11-25 DIAGNOSIS — E1165 Type 2 diabetes mellitus with hyperglycemia: Secondary | ICD-10-CM | POA: Diagnosis not present

## 2015-12-01 DIAGNOSIS — I25118 Atherosclerotic heart disease of native coronary artery with other forms of angina pectoris: Secondary | ICD-10-CM | POA: Diagnosis not present

## 2015-12-01 DIAGNOSIS — Z23 Encounter for immunization: Secondary | ICD-10-CM | POA: Diagnosis not present

## 2015-12-01 DIAGNOSIS — E7800 Pure hypercholesterolemia, unspecified: Secondary | ICD-10-CM | POA: Diagnosis not present

## 2015-12-01 DIAGNOSIS — R21 Rash and other nonspecific skin eruption: Secondary | ICD-10-CM | POA: Diagnosis not present

## 2015-12-01 DIAGNOSIS — I10 Essential (primary) hypertension: Secondary | ICD-10-CM | POA: Diagnosis not present

## 2015-12-01 DIAGNOSIS — E1165 Type 2 diabetes mellitus with hyperglycemia: Secondary | ICD-10-CM | POA: Diagnosis not present

## 2015-12-02 DIAGNOSIS — I10 Essential (primary) hypertension: Secondary | ICD-10-CM | POA: Diagnosis not present

## 2015-12-02 DIAGNOSIS — E7800 Pure hypercholesterolemia, unspecified: Secondary | ICD-10-CM | POA: Diagnosis not present

## 2015-12-02 DIAGNOSIS — E1165 Type 2 diabetes mellitus with hyperglycemia: Secondary | ICD-10-CM | POA: Diagnosis not present

## 2015-12-07 DIAGNOSIS — H9201 Otalgia, right ear: Secondary | ICD-10-CM | POA: Diagnosis not present

## 2015-12-07 DIAGNOSIS — G500 Trigeminal neuralgia: Secondary | ICD-10-CM | POA: Diagnosis not present

## 2015-12-09 DIAGNOSIS — R002 Palpitations: Secondary | ICD-10-CM | POA: Diagnosis not present

## 2015-12-11 DIAGNOSIS — J4540 Moderate persistent asthma, uncomplicated: Secondary | ICD-10-CM | POA: Diagnosis not present

## 2015-12-11 DIAGNOSIS — J301 Allergic rhinitis due to pollen: Secondary | ICD-10-CM | POA: Diagnosis not present

## 2015-12-11 DIAGNOSIS — G4733 Obstructive sleep apnea (adult) (pediatric): Secondary | ICD-10-CM | POA: Diagnosis not present

## 2015-12-11 DIAGNOSIS — F17210 Nicotine dependence, cigarettes, uncomplicated: Secondary | ICD-10-CM | POA: Diagnosis not present

## 2015-12-18 DIAGNOSIS — Z1231 Encounter for screening mammogram for malignant neoplasm of breast: Secondary | ICD-10-CM | POA: Diagnosis not present

## 2015-12-30 DIAGNOSIS — G4733 Obstructive sleep apnea (adult) (pediatric): Secondary | ICD-10-CM | POA: Diagnosis not present

## 2015-12-30 DIAGNOSIS — J301 Allergic rhinitis due to pollen: Secondary | ICD-10-CM | POA: Diagnosis not present

## 2015-12-30 DIAGNOSIS — G500 Trigeminal neuralgia: Secondary | ICD-10-CM | POA: Diagnosis not present

## 2015-12-30 DIAGNOSIS — K219 Gastro-esophageal reflux disease without esophagitis: Secondary | ICD-10-CM | POA: Diagnosis not present

## 2015-12-30 DIAGNOSIS — J4540 Moderate persistent asthma, uncomplicated: Secondary | ICD-10-CM | POA: Diagnosis not present

## 2015-12-30 DIAGNOSIS — H9201 Otalgia, right ear: Secondary | ICD-10-CM | POA: Diagnosis not present

## 2015-12-30 DIAGNOSIS — F17210 Nicotine dependence, cigarettes, uncomplicated: Secondary | ICD-10-CM | POA: Diagnosis not present

## 2016-01-04 DIAGNOSIS — M5134 Other intervertebral disc degeneration, thoracic region: Secondary | ICD-10-CM | POA: Diagnosis not present

## 2016-01-04 DIAGNOSIS — M9904 Segmental and somatic dysfunction of sacral region: Secondary | ICD-10-CM | POA: Diagnosis not present

## 2016-01-04 DIAGNOSIS — M5387 Other specified dorsopathies, lumbosacral region: Secondary | ICD-10-CM | POA: Diagnosis not present

## 2016-01-04 DIAGNOSIS — M9902 Segmental and somatic dysfunction of thoracic region: Secondary | ICD-10-CM | POA: Diagnosis not present

## 2016-01-04 DIAGNOSIS — M9903 Segmental and somatic dysfunction of lumbar region: Secondary | ICD-10-CM | POA: Diagnosis not present

## 2016-01-05 DIAGNOSIS — M9902 Segmental and somatic dysfunction of thoracic region: Secondary | ICD-10-CM | POA: Diagnosis not present

## 2016-01-05 DIAGNOSIS — M9904 Segmental and somatic dysfunction of sacral region: Secondary | ICD-10-CM | POA: Diagnosis not present

## 2016-01-05 DIAGNOSIS — M5387 Other specified dorsopathies, lumbosacral region: Secondary | ICD-10-CM | POA: Diagnosis not present

## 2016-01-05 DIAGNOSIS — M5134 Other intervertebral disc degeneration, thoracic region: Secondary | ICD-10-CM | POA: Diagnosis not present

## 2016-01-05 DIAGNOSIS — M9903 Segmental and somatic dysfunction of lumbar region: Secondary | ICD-10-CM | POA: Diagnosis not present

## 2016-01-06 DIAGNOSIS — M5134 Other intervertebral disc degeneration, thoracic region: Secondary | ICD-10-CM | POA: Diagnosis not present

## 2016-01-06 DIAGNOSIS — M9902 Segmental and somatic dysfunction of thoracic region: Secondary | ICD-10-CM | POA: Diagnosis not present

## 2016-01-06 DIAGNOSIS — M5387 Other specified dorsopathies, lumbosacral region: Secondary | ICD-10-CM | POA: Diagnosis not present

## 2016-01-06 DIAGNOSIS — M9903 Segmental and somatic dysfunction of lumbar region: Secondary | ICD-10-CM | POA: Diagnosis not present

## 2016-01-06 DIAGNOSIS — M9904 Segmental and somatic dysfunction of sacral region: Secondary | ICD-10-CM | POA: Diagnosis not present

## 2016-01-07 DIAGNOSIS — M9903 Segmental and somatic dysfunction of lumbar region: Secondary | ICD-10-CM | POA: Diagnosis not present

## 2016-01-07 DIAGNOSIS — M5387 Other specified dorsopathies, lumbosacral region: Secondary | ICD-10-CM | POA: Diagnosis not present

## 2016-01-07 DIAGNOSIS — M9902 Segmental and somatic dysfunction of thoracic region: Secondary | ICD-10-CM | POA: Diagnosis not present

## 2016-01-07 DIAGNOSIS — M5134 Other intervertebral disc degeneration, thoracic region: Secondary | ICD-10-CM | POA: Diagnosis not present

## 2016-01-07 DIAGNOSIS — M9904 Segmental and somatic dysfunction of sacral region: Secondary | ICD-10-CM | POA: Diagnosis not present

## 2016-01-11 DIAGNOSIS — M9904 Segmental and somatic dysfunction of sacral region: Secondary | ICD-10-CM | POA: Diagnosis not present

## 2016-01-11 DIAGNOSIS — M9902 Segmental and somatic dysfunction of thoracic region: Secondary | ICD-10-CM | POA: Diagnosis not present

## 2016-01-11 DIAGNOSIS — M5387 Other specified dorsopathies, lumbosacral region: Secondary | ICD-10-CM | POA: Diagnosis not present

## 2016-01-11 DIAGNOSIS — M9903 Segmental and somatic dysfunction of lumbar region: Secondary | ICD-10-CM | POA: Diagnosis not present

## 2016-01-11 DIAGNOSIS — M5134 Other intervertebral disc degeneration, thoracic region: Secondary | ICD-10-CM | POA: Diagnosis not present

## 2016-01-18 DIAGNOSIS — N3001 Acute cystitis with hematuria: Secondary | ICD-10-CM | POA: Diagnosis not present

## 2016-01-18 DIAGNOSIS — N3000 Acute cystitis without hematuria: Secondary | ICD-10-CM | POA: Diagnosis not present

## 2016-03-03 DIAGNOSIS — J449 Chronic obstructive pulmonary disease, unspecified: Secondary | ICD-10-CM | POA: Diagnosis not present

## 2016-03-03 DIAGNOSIS — F17200 Nicotine dependence, unspecified, uncomplicated: Secondary | ICD-10-CM | POA: Diagnosis not present

## 2016-03-03 DIAGNOSIS — I10 Essential (primary) hypertension: Secondary | ICD-10-CM | POA: Diagnosis not present

## 2016-03-03 DIAGNOSIS — E7800 Pure hypercholesterolemia, unspecified: Secondary | ICD-10-CM | POA: Diagnosis not present

## 2016-03-03 DIAGNOSIS — E1165 Type 2 diabetes mellitus with hyperglycemia: Secondary | ICD-10-CM | POA: Diagnosis not present

## 2016-04-21 DIAGNOSIS — J449 Chronic obstructive pulmonary disease, unspecified: Secondary | ICD-10-CM | POA: Diagnosis not present

## 2016-04-21 DIAGNOSIS — R062 Wheezing: Secondary | ICD-10-CM | POA: Diagnosis not present

## 2016-04-21 DIAGNOSIS — R0602 Shortness of breath: Secondary | ICD-10-CM | POA: Diagnosis not present

## 2016-08-27 DIAGNOSIS — K529 Noninfective gastroenteritis and colitis, unspecified: Secondary | ICD-10-CM | POA: Diagnosis not present

## 2016-08-27 DIAGNOSIS — R1084 Generalized abdominal pain: Secondary | ICD-10-CM | POA: Diagnosis not present

## 2017-02-01 DIAGNOSIS — E876 Hypokalemia: Secondary | ICD-10-CM | POA: Diagnosis not present

## 2017-02-01 DIAGNOSIS — I10 Essential (primary) hypertension: Secondary | ICD-10-CM | POA: Diagnosis not present

## 2017-02-01 DIAGNOSIS — E119 Type 2 diabetes mellitus without complications: Secondary | ICD-10-CM | POA: Diagnosis not present

## 2017-02-01 DIAGNOSIS — R531 Weakness: Secondary | ICD-10-CM | POA: Diagnosis not present

## 2017-02-01 DIAGNOSIS — E785 Hyperlipidemia, unspecified: Secondary | ICD-10-CM | POA: Diagnosis not present

## 2017-02-01 DIAGNOSIS — Z720 Tobacco use: Secondary | ICD-10-CM | POA: Diagnosis not present

## 2017-02-01 DIAGNOSIS — R739 Hyperglycemia, unspecified: Secondary | ICD-10-CM | POA: Diagnosis not present

## 2017-02-02 DIAGNOSIS — R739 Hyperglycemia, unspecified: Secondary | ICD-10-CM | POA: Diagnosis not present

## 2017-02-02 DIAGNOSIS — Z720 Tobacco use: Secondary | ICD-10-CM | POA: Diagnosis not present

## 2017-02-02 DIAGNOSIS — E119 Type 2 diabetes mellitus without complications: Secondary | ICD-10-CM | POA: Diagnosis not present

## 2017-02-02 DIAGNOSIS — R531 Weakness: Secondary | ICD-10-CM | POA: Diagnosis not present

## 2017-02-02 DIAGNOSIS — E876 Hypokalemia: Secondary | ICD-10-CM | POA: Diagnosis not present

## 2017-02-02 DIAGNOSIS — E785 Hyperlipidemia, unspecified: Secondary | ICD-10-CM | POA: Diagnosis not present

## 2017-02-02 DIAGNOSIS — I10 Essential (primary) hypertension: Secondary | ICD-10-CM | POA: Diagnosis not present

## 2019-06-20 ENCOUNTER — Encounter: Payer: Self-pay | Admitting: Gastroenterology

## 2019-07-02 DIAGNOSIS — R600 Localized edema: Secondary | ICD-10-CM | POA: Insufficient documentation

## 2019-07-19 ENCOUNTER — Ambulatory Visit: Payer: Medicare Other | Admitting: Gastroenterology

## 2020-09-02 DIAGNOSIS — R601 Generalized edema: Secondary | ICD-10-CM

## 2021-12-10 DIAGNOSIS — R55 Syncope and collapse: Secondary | ICD-10-CM | POA: Diagnosis not present

## 2022-10-05 DIAGNOSIS — E876 Hypokalemia: Secondary | ICD-10-CM | POA: Diagnosis not present

## 2022-10-05 DIAGNOSIS — Z794 Long term (current) use of insulin: Secondary | ICD-10-CM

## 2022-10-05 DIAGNOSIS — L0390 Cellulitis, unspecified: Secondary | ICD-10-CM | POA: Diagnosis not present

## 2022-10-05 DIAGNOSIS — E1159 Type 2 diabetes mellitus with other circulatory complications: Secondary | ICD-10-CM | POA: Diagnosis not present

## 2022-10-05 DIAGNOSIS — E785 Hyperlipidemia, unspecified: Secondary | ICD-10-CM

## 2022-10-05 DIAGNOSIS — I10 Essential (primary) hypertension: Secondary | ICD-10-CM

## 2022-10-05 DIAGNOSIS — I2510 Atherosclerotic heart disease of native coronary artery without angina pectoris: Secondary | ICD-10-CM | POA: Diagnosis not present

## 2022-10-05 DIAGNOSIS — R000 Tachycardia, unspecified: Secondary | ICD-10-CM | POA: Diagnosis not present

## 2022-10-05 DIAGNOSIS — Z720 Tobacco use: Secondary | ICD-10-CM

## 2022-10-06 DIAGNOSIS — R000 Tachycardia, unspecified: Secondary | ICD-10-CM | POA: Diagnosis not present

## 2022-10-06 DIAGNOSIS — I361 Nonrheumatic tricuspid (valve) insufficiency: Secondary | ICD-10-CM | POA: Diagnosis not present

## 2022-10-06 DIAGNOSIS — E876 Hypokalemia: Secondary | ICD-10-CM | POA: Diagnosis not present

## 2022-10-06 DIAGNOSIS — E1159 Type 2 diabetes mellitus with other circulatory complications: Secondary | ICD-10-CM | POA: Diagnosis not present

## 2022-10-06 DIAGNOSIS — L0390 Cellulitis, unspecified: Secondary | ICD-10-CM | POA: Diagnosis not present

## 2022-11-18 DIAGNOSIS — I951 Orthostatic hypotension: Secondary | ICD-10-CM | POA: Insufficient documentation

## 2022-11-18 DIAGNOSIS — R072 Precordial pain: Secondary | ICD-10-CM | POA: Insufficient documentation

## 2023-06-02 ENCOUNTER — Ambulatory Visit: Admitting: Podiatry

## 2023-06-02 DIAGNOSIS — M79674 Pain in right toe(s): Secondary | ICD-10-CM | POA: Diagnosis not present

## 2023-06-02 DIAGNOSIS — E1151 Type 2 diabetes mellitus with diabetic peripheral angiopathy without gangrene: Secondary | ICD-10-CM | POA: Diagnosis not present

## 2023-06-02 MED ORDER — TRAMADOL HCL 50 MG PO TABS: 50.0000 mg | ORAL_TABLET | Freq: Two times a day (BID) | ORAL | 0 refills | Status: AC | PRN

## 2023-06-02 NOTE — Progress Notes (Signed)
 Chief Complaint  Patient presents with   Ingrown Toenail    Right great toe nail, was removed 20 plus years ago. It feels like an ingrown and is very painful. Last A1c was 8.8 last week and takes plavix   HPI: 63 y.o. diabetic female presents today with concern of pain in the right great toe.  Patient thinks that the majority of the pain is around the nail.  She had the nail removed 20 years ago.  She denies any change in the nail itself over the past 2 weeks when her pain began.  Patient states that the pain began suddenly and is getting worse.  Pressure aggravates the pain.  Denies any drainage from around the nail.  Denies injury.  Notes the pain is pretty severe.  Patient is a daily cigarette smoker.  She does have history of myocardial infarction.  She takes aspirin/Plavix combination.  Her most recent A1c was 8.8  Past Medical History:  Diagnosis Date   Allergic rhinitis    COPD (chronic obstructive pulmonary disease) (HCC)    Diabetes (HCC)    GERD (gastroesophageal reflux disease)    History of colon polyps    Hypercholesteremia    Hypertension    IBS (irritable bowel syndrome)    Seasonal allergies    Trigeminal neuralgia     Past Surgical History:  Procedure Laterality Date   ABDOMINAL HYSTERECTOMY     BREAST CYST ASPIRATION Right    COLONOSCOPY  09/15/2014   [facility]. Multiple small polyps in the transverse and sigmoid colon, not removed today as the patient is on clopidogrel   ELBOW SURGERY     bilat   ESOPHAGOGASTRODUODENOSCOPY  02/06/2014   Small hiatal hernia. Mild gastritis. Gastric polyp status post polypectomy.   GALLBLADDER SURGERY     KNEE SURGERY     left   LAPAROSCOPY  1999   showed endometriosis   OOPHORECTOMY  11/15/2013   Dr Leota Jacobsen- and salpingectomy   ROTATOR CUFF REPAIR     left    Allergies  Allergen Reactions   Rosuvastatin     Other Reaction(s): myalgias (muscle pain)  rosuvastatin   Dulaglutide Nausea Only, Nausea And  Vomiting and Other (See Comments)    Pancreatitis per pt  Trulicity   Oxycodone-Acetaminophen Hives   Oxycodone-Acetaminophen Other (See Comments)   Statins Other (See Comments)    Stated shutting down organs.    Physical Exam: Nonpalpable pedal pulses right foot, palpable pedal pulses on the left foot.  Right toes are cool with absent digital hair.  There is pain on palpation throughout the entire hallux.  There is evidence of some nail regrowth but this appears chronic and stable.  There is no lysis of the nail.  No fluctuation on palpation of the nail.  No edema or erythema to the right hallux.  Capillary refill is delayed on the right foot.  Epicritic sensation is intact.  There is no pain at the first MPJ or with range of motion of the first MPJ.  The pain is distal to this joint extending out to the tip of the toe.  Compression of the nail plate aggravates pain.  No onychocryptosis is noted  Assessment/Plan of Care: 1. Type II diabetes mellitus with peripheral circulatory disorder (HCC)   2. Pain in right toe(s)      Meds ordered this encounter  Medications   traMADol (ULTRAM) 50 MG tablet    Sig: Take 1 tablet (50 mg  total) by mouth every 12 (twelve) hours as needed for up to 5 days.    Dispense:  10 tablet    Refill:  0   VAS [place] ABI WITH/WO TBI  Discussed clinical findings with patient today.  Informed the patient that I am highly suspicious that her pain could be vascular in origin.  The patient was thinking the nail needed to be removed today however since there is no change in the status of the nail since her symptoms started 2 weeks ago, I do not feel this is going to alleviate her pain.  There could be a arterial occlusion causing this pain in the right great toe.  Informed the patient I could not feel her pulses on the right foot but could easily palpate them on the left.  Will get the patient set up for a stat ABI bilateral to evaluate for possible occlusion to the right  foot.  Prescription for tramadol 50 mg, 10 tablets, 1 tablet p.o. q. 12 as needed for severe pain  Will contact patient after her ABIs have been received to discuss treatment plan   Hollan Philipp Orland Mustard, DPM, FACFAS [facility]     [address] [place], [place] [address]                Office [phone]  Fax [phone]

## 2023-06-04 ENCOUNTER — Encounter: Payer: Self-pay | Admitting: Podiatry

## 2023-06-04 DIAGNOSIS — K219 Gastro-esophageal reflux disease without esophagitis: Secondary | ICD-10-CM | POA: Insufficient documentation

## 2023-06-04 DIAGNOSIS — J309 Allergic rhinitis, unspecified: Secondary | ICD-10-CM | POA: Insufficient documentation

## 2023-06-04 DIAGNOSIS — K5790 Diverticulosis of intestine, part unspecified, without perforation or abscess without bleeding: Secondary | ICD-10-CM | POA: Insufficient documentation

## 2023-06-04 DIAGNOSIS — R140 Abdominal distension (gaseous): Secondary | ICD-10-CM | POA: Insufficient documentation

## 2023-06-04 DIAGNOSIS — E119 Type 2 diabetes mellitus without complications: Secondary | ICD-10-CM | POA: Insufficient documentation

## 2023-06-04 DIAGNOSIS — I252 Old myocardial infarction: Secondary | ICD-10-CM | POA: Insufficient documentation

## 2023-06-04 DIAGNOSIS — K625 Hemorrhage of anus and rectum: Secondary | ICD-10-CM | POA: Insufficient documentation

## 2023-06-04 DIAGNOSIS — K635 Polyp of colon: Secondary | ICD-10-CM | POA: Insufficient documentation

## 2023-06-04 DIAGNOSIS — K589 Irritable bowel syndrome without diarrhea: Secondary | ICD-10-CM | POA: Insufficient documentation

## 2023-06-26 ENCOUNTER — Other Ambulatory Visit: Payer: Self-pay | Admitting: Family Medicine

## 2023-06-26 ENCOUNTER — Ambulatory Visit: Attending: Podiatry

## 2023-06-26 DIAGNOSIS — I739 Peripheral vascular disease, unspecified: Secondary | ICD-10-CM

## 2023-06-26 DIAGNOSIS — E1151 Type 2 diabetes mellitus with diabetic peripheral angiopathy without gangrene: Secondary | ICD-10-CM | POA: Diagnosis not present

## 2023-06-29 LAB — VAS US ABI WITH/WO TBI
Left ABI: 0.99
Right ABI: 0.69

## 2023-06-30 NOTE — Progress Notes (Signed)
 Cindy, please notify patient ==> Patient does have moderate arterial occlusive disease in the right leg/foot.  I'm going to refer to vascular surgeon for evaluation.  That's the foot/toe she was having a lot of pain with and I was suspicious it was vascular in nature.  Thanks

## 2023-07-03 ENCOUNTER — Ambulatory Visit: Attending: Podiatry

## 2023-07-03 DIAGNOSIS — I739 Peripheral vascular disease, unspecified: Secondary | ICD-10-CM | POA: Diagnosis not present

## 2023-07-03 NOTE — Progress Notes (Signed)
 Office Note     CC:  Right toe pain with PAD Requesting Provider:  Joe Murders, DPM  HPI: Brandy Hall is a 63 y.o. (04/15/1960) female presenting at the request of Dia McCaughan D.P.M.  for right great toe pain.  A few months ago, the patient was seen in podiatry clinic for right great toe pain.  Has previous history of nail removed 20 years ago.  She felt that with the nail growing back, it was similar to pain than what she had previously.  The toe was evaluated, and there was some concern that the pain was due to peripheral arterial disease, therefore she was sent to our clinic.  One exam, Edynn was doing well, accompanied by her daughter.  [place] native [place], she [place] to live [place].  She worked for the [employer] for years prior to retirement.  She now collects pension.  Regarding right toe pain.  The pain is not constant.  It waxes and wanes.  It usually occurs a few times per week, and is aggravated when she hits her toe on something.  The pain resolves with time.  She denies open wound, erythema, induration.  She is a daily cigarette smoker with history of MI longstanding diabetes-most recent A1c 8.8.  At baseline, Ammon Bales has difficulty with ambulation.  She notes unsteadiness in the right leg.  She denies history of claudication, ischemic rest pain, tissue loss. She can ambulate roughly 10 feet at a time.  The pt is not on a statin for cholesterol management.  The pt is  on a daily aspirin.   Other AC:  - The pt is  on medication for hypertension.   The pt is not diabetic.  Tobacco hx:  current  Past Medical History:  Diagnosis Date   Allergic rhinitis    COPD (chronic obstructive pulmonary disease) (HCC)    Diabetes (HCC)    GERD (gastroesophageal reflux disease)    History of colon polyps    Hypercholesteremia    Hypertension    IBS (irritable bowel syndrome)    Seasonal allergies    Trigeminal neuralgia     Past Surgical History:  Procedure Laterality Date    ABDOMINAL HYSTERECTOMY     BREAST CYST ASPIRATION Right    COLONOSCOPY  09/15/2014   [facility]. Multiple small polyps in the transverse and sigmoid colon, not removed today as the patient is on clopidogrel   ELBOW SURGERY     bilat   ESOPHAGOGASTRODUODENOSCOPY  02/06/2014   Small hiatal hernia. Mild gastritis. Gastric polyp status post polypectomy.   GALLBLADDER SURGERY     KNEE SURGERY     left   LAPAROSCOPY  1999   showed endometriosis   OOPHORECTOMY  11/15/2013   Dr Aneta Bar- and salpingectomy   ROTATOR CUFF REPAIR     left    Social History   Socioeconomic History   Marital status: Single    Spouse name: Not on file   Number of children: Not on file   Years of education: Not on file   Highest education level: Not on file  Occupational History   Not on file  Tobacco Use   Smoking status: Every Day    Current packs/day: 0.50    Average packs/day: 0.5 packs/day for 25.0 years (12.5 ttl pk-yrs)    Types: Cigarettes   Smokeless tobacco: Never  Substance and Sexual Activity   Alcohol use: No   Drug use: Not on file   Sexual activity:  Not on file  Other Topics Concern   Not on file  Social History Narrative   Single Lives with grandchild.   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  Current Outpatient Medications  Medication Sig Dispense Refill   albuterol  (PROVENTIL  HFA;VENTOLIN  HFA) 108 (90 BASE) MCG/ACT inhaler Inhale 2 puffs into the lungs every 6 (six) hours as needed for wheezing or shortness of breath.     amLODipine (NORVASC) 5 MG tablet Take 5 mg by mouth. (Patient not taking: Reported on 06/02/2023)     ASPIRIN 81 PO 81 mg once a day     cholecalciferol (VITAMIN D) 1000 UNITS tablet Take 1,000  Units by mouth daily.     clopidogrel (PLAVIX) 75 MG tablet Take 75 mg by mouth.     famotidine (PEPCID) 20 MG tablet 20 mg per 1 tablet, ORAL, BID (2 times a day), 0 Refill(s)     insulin aspart (NOVOLOG) 100 UNIT/ML injection      isosorbide mononitrate (IMDUR) 30 MG 24 hr tablet Take 30 mg by mouth 2 (two) times daily.      LANTUS SOLOSTAR 100 UNIT/ML Solostar Pen 24 units sq once a day with meals     lisinopril-hydrochlorothiazide (PRINZIDE,ZESTORETIC) 20-25 MG per tablet Take 1 tablet daily by mouth     metoprolol tartrate (LOPRESSOR) 25 MG tablet Take 25 mg by mouth 2 (two) times daily.     mometasone -formoterol  (DULERA) 200-5 MCG/ACT AERO Inhale 2 puffs into the lungs 2 (two) times daily. (Patient not taking: Reported on 06/02/2023) 1 Inhaler 6   Multiple Vitamin (MULTIVITAMIN) capsule Take 1 capsule by mouth daily.     nitroGLYCERIN (NITROSTAT) 0.4 MG SL tablet Place 0.4 mg under the tongue.     ondansetron (ZOFRAN) 4 MG tablet TAKE 1 TABLET(S) BY MOUTH 3 TIMES/DAY AS NEEDED FOR NAUSEA AND VOMITING  0   ranolazine (RANEXA) 1000 MG SR tablet Take 500 mg by mouth.     SitaGLIPtin-MetFORMIN HCl (JANUMET XR) 50-1000 MG TB24 Take 1 tablet by mouth twice a day with meals (Patient not taking: Reported on 06/02/2023)     No current facility-administered medications for this visit.    Allergies  Allergen Reactions   Rosuvastatin     Other Reaction(s): myalgias (muscle pain)  rosuvastatin   Dulaglutide Nausea Only, Nausea And Vomiting and Other (See Comments)    Pancreatitis per pt  Trulicity   Oxycodone-Acetaminophen Hives   Oxycodone-Acetaminophen Other (See Comments)   Statins Other (See Comments)    Stated shutting down organs.      REVIEW OF SYSTEMS:  [X]  denotes positive finding, [ ]  denotes negative finding Cardiac  Comments:  Chest pain or chest pressure:    Shortness of breath upon exertion:    Short of breath when lying flat:    Irregular heart rhythm:        Vascular     Pain in calf, thigh, or hip brought on by ambulation:    Pain in feet at night that wakes you up from your sleep:     Blood clot in your veins:    Leg swelling:         Pulmonary    Oxygen  at home:    Productive cough:     Wheezing:         Neurologic    Sudden weakness in arms or legs:     Sudden numbness in arms or legs:     Sudden onset of difficulty speaking or slurred speech:    Temporary loss of vision in one eye:     Problems with dizziness:         Gastrointestinal    Blood in stool:     Vomited blood:         Genitourinary    Burning when urinating:     Blood in urine:        Psychiatric    Major depression:         Hematologic    Bleeding problems:    Problems with blood clotting too easily:        Skin    Rashes or ulcers:        Constitutional    Fever or chills:      PHYSICAL EXAMINATION:  There were no vitals filed for this visit.  General:  WDWN in NAD; vital signs documented above Gait: Not observed HENT: WNL, normocephalic Pulmonary: normal non-labored breathing , without wheezing Cardiac: regular HR Abdomen: soft, NT, no masses Skin: without rashes Vascular Exam/Pulses:  Right Left  Radial 2+ (normal) 2+ (normal)  Ulnar    Femoral 1+ (weak) 2+ (normal)  Popliteal    DP absent 2+ (normal)  PT     Extremities: without ischemic changes, without Gangrene , without cellulitis; without open wounds;  Musculoskeletal: no muscle wasting or atrophy  Neurologic: A&O X 3;  No focal weakness or paresthesias are detected Psychiatric:  The pt has Normal affect.   Non-Invasive Vascular Imaging:     +----------+--------+-----+---------------+--------------+--------+  RIGHT    PSV cm/sRatioStenosis       Waveform      Comments  +----------+--------+-----+---------------+--------------+--------+  CIA Prox  385     4/1  75-99% stenosismonophasic              +----------+--------+-----+---------------+--------------+--------+   EIA Distal61                          monophasic              +----------+--------+-----+---------------+--------------+--------+  CFA Distal43                          monophasic              +----------+--------+-----+---------------+--------------+--------+  DFA      22                          monophasic              +----------+--------+-----+---------------+--------------+--------+  SFA Prox  41                          monophasic              +----------+--------+-----+---------------+--------------+--------+  SFA Mid   33                          monophasic              +----------+--------+-----+---------------+--------------+--------+  SFA Distal33  monophasic              +----------+--------+-----+---------------+--------------+--------+  POP Prox  18                          monophasic              +----------+--------+-----+---------------+--------------+--------+  ATA Mid                               not identified          +----------+--------+-----+---------------+--------------+--------+  PTA Mid   17                          monophasic              +----------+--------+-----+---------------+--------------+--------+  PERO Mid  17                          monophasic              +----------+--------+-----+---------------+--------------+--------+   A focal velocity elevation of was obtained at proximal common iliac  artery. Findings are characteristic of 75-99% stenosis.      +----------+--------+-----+--------+---------+--------+  LEFT     PSV cm/sRatioStenosisWaveform Comments  +----------+--------+-----+--------+---------+--------+  EIA Distal103                  triphasic          +----------+--------+-----+--------+---------+--------+  CFA Distal81                   triphasic          +----------+--------+-----+--------+---------+--------+  DFA       77                   triphasic          +----------+--------+-----+--------+---------+--------+  SFA Prox  65                   triphasic          +----------+--------+-----+--------+---------+--------+  SFA Mid   66                   triphasic          +----------+--------+-----+--------+---------+--------+  SFA Distal48                   triphasic          +----------+--------+-----+--------+---------+--------+  POP Prox  40                   triphasic          +----------+--------+-----+--------+---------+--------+  ATA Mid   23                   triphasic          +----------+--------+-----+--------+---------+--------+  PTA Mid   30                   triphasic          +----------+--------+-----+--------+---------+--------+  PERO Mid  36                   triphasic          +----------+--------+-----+--------+---------+--------+     ABI Findings:  +---------+------------------+-----+-----------+--------+  Right   Rt Pressure (mmHg)IndexWaveform   Comment   +---------+------------------+-----+-----------+--------+  Brachial 178                                         +---------+------------------+-----+-----------+--------+  PTA     116               0.64 multiphasic          +---------+------------------+-----+-----------+--------+  DP      125               0.69 multiphasic          +---------+------------------+-----+-----------+--------+  Great Toe131               0.72                      +---------+------------------+-----+-----------+--------+   +---------+------------------+-----+--------+-------+  Left    Lt Pressure (mmHg)IndexWaveformComment  +---------+------------------+-----+--------+-------+  Brachial 181                                     +---------+------------------+-----+--------+-------+  PTA     177               0.98                   +---------+------------------+-----+--------+-------+  DP      179               0.99                  +---------+------------------+-----+--------+-------+  Great Toe154               0.85                  +---------+------------------+-----+--------+-------+   +-------+-----------+-----------+------------+------------+  ABI/TBIToday's ABIToday's TBIPrevious ABIPrevious TBI  +-------+-----------+-----------+------------+------------+  Right .69        .72                                  +-------+-----------+-----------+------------+------------+  Left  .99        .85                                  +-------+-----------+-----------+------------+------------+       ASSESSMENT/PLAN: ZOELLA HAMMAKER is a 63 y.o. female presenting with waxing and waning right great toe pain with concern for arterial compromise.    The toe pain is inconsistent with arterial insufficiency as it waxes and wanes, and is present mainly when the patient bumps the toe.  She is convinced that the pain is similar to what she had 20 years ago when she needed the toenail removed.  On physical exam she had a nonpalpable pulse in the right foot, 1+ femoral pulse on the right. Imaging was reviewed demonstrating right-sided iliac stenosis on arterial duplex.  Upon review of ABI, I think that values are falsely elevated, especially at the right toe which demonstrates a toe pressure of 131, but very minimal phasicity.  I had a long discussion with Raimi regarding the above.  With her lifestyle choices such as daily smoking for decades, comorbidities which include longstanding diabetes, and nonpalpable pulse in the foot.  I would recommend diagnostic angiography with possible intervention in the right lower extremity prior to toenail removal should this be deemed necessary by her podiatrist.  Longstanding diabetes damages in the microcirculation, especially at the level of the feet and toes.   With a nonpalpable pulse in the foot, I am worried  that she would have wound healing issues at the toenail removal site.  The other option would be continued conservative management.  Ammon Bales stated that the toenail needs to go, and that she will discuss this with her podiatrist.  I have discussed her care with my office.  We plan to refer her back to her podiatrist Dr.McCaughan.  Should they move forward with toenail removal, I asked her to call my office for angiography scheduling.  After discussing risk and benefits the above, Ammon Bales elected to proceed.  We discussed the importance of smoking cessation.  She is aware that if a stent were needed, smoking would increase risk of subsequent occlusion. Will need to hold Eliquis 2 days before.  Kayla Part, MD [facility] [phone] Total time of patient care including pre-visit research, consultation, and documentation greater than 45 minutes

## 2023-07-06 ENCOUNTER — Telehealth: Payer: Self-pay

## 2023-07-06 NOTE — Telephone Encounter (Signed)
-----   Message from Joe Murders sent at 06/30/2023  7:42 AM EDT ----- Brandy Hall, please notify patient ==> Patient does have moderate arterial occlusive disease in the right leg/foot.  I'm going to refer to vascular surgeon for evaluation.  That's the foot/toe she was having a lot of pain with and I was suspicious it was vascular in nature.  Thanks

## 2023-07-07 ENCOUNTER — Ambulatory Visit: Attending: Vascular Surgery | Admitting: Vascular Surgery

## 2023-07-07 ENCOUNTER — Encounter: Payer: Self-pay | Admitting: Vascular Surgery

## 2023-07-07 VITALS — BP 171/99 | HR 66 | Temp 97.8°F | Resp 20 | Ht 68.0 in | Wt 189.0 lb

## 2023-07-07 DIAGNOSIS — I739 Peripheral vascular disease, unspecified: Secondary | ICD-10-CM | POA: Diagnosis not present

## 2023-07-07 DIAGNOSIS — M79674 Pain in right toe(s): Secondary | ICD-10-CM

## 2023-07-17 ENCOUNTER — Telehealth: Payer: Self-pay

## 2023-07-17 NOTE — Telephone Encounter (Signed)
 Patient called requesting aortogram with Dr. Rosalva Comber.  Per JER's last note, she needs to see her podiatrist prior to scheduling procedure.  Left message on patient's VM, per her request, stating that she will need to see her podiatrist first.

## 2023-07-27 ENCOUNTER — Ambulatory Visit: Admitting: Podiatry

## 2023-07-27 DIAGNOSIS — E1151 Type 2 diabetes mellitus with diabetic peripheral angiopathy without gangrene: Secondary | ICD-10-CM

## 2023-07-27 DIAGNOSIS — M79674 Pain in right toe(s): Secondary | ICD-10-CM

## 2023-07-27 NOTE — Progress Notes (Unsigned)
 Chief Complaint  Patient presents with   Nail Problem    Here today because the vascular sx wanted her to see you before he put in the stints. She is unsure why, but she thinks it is so you can send notes and confirm she needs the stints to be able to heal.   Last A1c 8.8 a month ago. Takes Plavix and ASA 81   HPI: 63 y.o. female presents today after being evaluated by vascular surgery.  After reviewing their notes, they did recommend revascularization on the right lower extremity before she would have the right hallux toenail removed.  She states that she was referred back here for evaluation to see if we feel the toenail procedure is necessary, which has her confused, because she thought we had already decided it was necessary at a previous appointment.  As she notes that the toenail is painful every day.  Any pressure causes severe pain. Past Medical History:  Diagnosis Date   Allergic rhinitis    COPD (chronic obstructive pulmonary disease) (HCC)    Diabetes (HCC)    GERD (gastroesophageal reflux disease)    History of colon polyps    Hypercholesteremia    Hypertension    IBS (irritable bowel syndrome)    Seasonal allergies    Trigeminal neuralgia    Past Surgical History:  Procedure Laterality Date   ABDOMINAL HYSTERECTOMY     BREAST CYST ASPIRATION Right    COLONOSCOPY  09/15/2014   [facility]. Multiple small polyps in the transverse and sigmoid colon, not removed today as the patient is on clopidogrel   ELBOW SURGERY     bilat   ESOPHAGOGASTRODUODENOSCOPY  02/06/2014   Small hiatal hernia. Mild gastritis. Gastric polyp status post polypectomy.   GALLBLADDER SURGERY     KNEE SURGERY     left   LAPAROSCOPY  1999   showed endometriosis   OOPHORECTOMY  11/15/2013   Dr Aneta Bar- and salpingectomy   ROTATOR CUFF REPAIR     left   Allergies  Allergen Reactions   Rosuvastatin     Other Reaction(s): myalgias (muscle pain)  rosuvastatin   Dulaglutide Nausea Only, Nausea  And Vomiting and Other (See Comments)    Pancreatitis per pt  Trulicity   Oxycodone-Acetaminophen Hives   Oxycodone-Acetaminophen Other (See Comments)   Statins Other (See Comments)    Stated shutting down organs.     Physical Exam: Nonpalpable pedal pulses right foot.  There is hyperpigmentation throughout the entire right foot.  Significant pain with compression of the right hallux nail plate.  The nail is 3 mm thick and discolored as well.  No surrounding erythema or drainage is noted.  No areas of necrosis are seen.  The toes are cool.  Assessment/Plan of Care: 1. Pain in right toe(s)   2. Type II diabetes mellitus with peripheral circulatory disorder [facility])     Discussed findings with patient today.  Since she cannot tolerate any pressure to the dorsal aspect of the nail or distal aspect of the nail, I would recommend her proceed with the attempted revascularization by vascular surgery so that we may be able to remove the right great toenail in the future.  She was agreeable to this and understands that risks are involved with any vascular procedure/surgery.  These will be discussed in more detail by the vascular surgeon.  Informed the patient she may need to follow-up with the vascular surgeon 1-2 times following the procedure in order for them to give  clearance for us  to proceed with toenail removal.  She is to call our office to schedule this once cleared by vascular after the vascular procedure   Edessa Jakubowicz Jenita Miyamoto, DPM, FACFAS [facility]     [address] [place], [place] [address]                Office [phone]  Fax [phone]

## 2023-08-01 ENCOUNTER — Other Ambulatory Visit: Payer: Self-pay

## 2023-08-01 DIAGNOSIS — I739 Peripheral vascular disease, unspecified: Secondary | ICD-10-CM

## 2023-08-16 ENCOUNTER — Encounter (HOSPITAL_COMMUNITY): Payer: Self-pay

## 2023-08-16 ENCOUNTER — Observation Stay (HOSPITAL_COMMUNITY)
Admission: EM | Admit: 2023-08-16 | Discharge: 2023-08-17 | Disposition: A | Discharge status: Home / Self Care | Attending: Emergency Medicine | Admitting: Emergency Medicine

## 2023-08-16 ENCOUNTER — Other Ambulatory Visit: Payer: Self-pay

## 2023-08-16 ENCOUNTER — Ambulatory Visit (HOSPITAL_COMMUNITY)
Admission: RE | Admit: 2023-08-16 | Discharge: 2023-08-16 | Disposition: A | Discharge status: Home / Self Care | Source: Home / Self Care | Attending: Vascular Surgery | Admitting: Vascular Surgery

## 2023-08-16 ENCOUNTER — Encounter (HOSPITAL_COMMUNITY)
Admission: RE | Disposition: A | Discharge status: Home / Self Care | Payer: Self-pay | Source: Home / Self Care | Attending: Vascular Surgery

## 2023-08-16 DIAGNOSIS — Z794 Long term (current) use of insulin: Secondary | ICD-10-CM | POA: Diagnosis not present

## 2023-08-16 DIAGNOSIS — G4730 Sleep apnea, unspecified: Secondary | ICD-10-CM | POA: Diagnosis not present

## 2023-08-16 DIAGNOSIS — F17210 Nicotine dependence, cigarettes, uncomplicated: Secondary | ICD-10-CM | POA: Diagnosis not present

## 2023-08-16 DIAGNOSIS — R1310 Dysphagia, unspecified: Principal | ICD-10-CM | POA: Insufficient documentation

## 2023-08-16 DIAGNOSIS — R197 Diarrhea, unspecified: Secondary | ICD-10-CM | POA: Diagnosis not present

## 2023-08-16 DIAGNOSIS — R079 Chest pain, unspecified: Secondary | ICD-10-CM | POA: Insufficient documentation

## 2023-08-16 DIAGNOSIS — Z79899 Other long term (current) drug therapy: Secondary | ICD-10-CM | POA: Insufficient documentation

## 2023-08-16 DIAGNOSIS — I10 Essential (primary) hypertension: Secondary | ICD-10-CM | POA: Insufficient documentation

## 2023-08-16 DIAGNOSIS — Z538 Procedure and treatment not carried out for other reasons: Secondary | ICD-10-CM | POA: Insufficient documentation

## 2023-08-16 DIAGNOSIS — I739 Peripheral vascular disease, unspecified: Secondary | ICD-10-CM

## 2023-08-16 DIAGNOSIS — J449 Chronic obstructive pulmonary disease, unspecified: Secondary | ICD-10-CM | POA: Insufficient documentation

## 2023-08-16 DIAGNOSIS — M79674 Pain in right toe(s): Secondary | ICD-10-CM | POA: Diagnosis not present

## 2023-08-16 DIAGNOSIS — Z7902 Long term (current) use of antithrombotics/antiplatelets: Secondary | ICD-10-CM | POA: Insufficient documentation

## 2023-08-16 DIAGNOSIS — E119 Type 2 diabetes mellitus without complications: Secondary | ICD-10-CM | POA: Diagnosis not present

## 2023-08-16 DIAGNOSIS — I25119 Atherosclerotic heart disease of native coronary artery with unspecified angina pectoris: Secondary | ICD-10-CM | POA: Diagnosis not present

## 2023-08-16 DIAGNOSIS — F17200 Nicotine dependence, unspecified, uncomplicated: Secondary | ICD-10-CM | POA: Diagnosis present

## 2023-08-16 DIAGNOSIS — Z720 Tobacco use: Secondary | ICD-10-CM | POA: Diagnosis present

## 2023-08-16 HISTORY — PX: ABDOMINAL AORTOGRAM: CATH118222

## 2023-08-16 HISTORY — PX: LOWER EXTREMITY ANGIOGRAPHY: CATH118251

## 2023-08-16 LAB — CBC WITH DIFFERENTIAL/PLATELET
Abs Immature Granulocytes: 0.02 10*3/uL (ref 0.00–0.07)
Basophils Absolute: 0 10*3/uL (ref 0.0–0.1)
Basophils Relative: 0 %
Eosinophils Absolute: 0 10*3/uL (ref 0.0–0.5)
Eosinophils Relative: 0 %
HCT: 39 % (ref 36.0–46.0)
Hemoglobin: 13.2 g/dL (ref 12.0–15.0)
Immature Granulocytes: 0 %
Lymphocytes Relative: 23 %
Lymphs Abs: 1.1 10*3/uL (ref 0.7–4.0)
MCH: 35.7 pg — ABNORMAL HIGH (ref 26.0–34.0)
MCHC: 33.8 g/dL (ref 30.0–36.0)
MCV: 105.4 fL — ABNORMAL HIGH (ref 80.0–100.0)
Monocytes Absolute: 0.7 10*3/uL (ref 0.1–1.0)
Monocytes Relative: 13 %
Neutro Abs: 3.1 10*3/uL (ref 1.7–7.7)
Neutrophils Relative %: 64 %
Platelets: 156 10*3/uL (ref 150–400)
RBC: 3.7 MIL/uL — ABNORMAL LOW (ref 3.87–5.11)
RDW: 14.8 % (ref 11.5–15.5)
WBC: 5 10*3/uL (ref 4.0–10.5)
nRBC: 0 % (ref 0.0–0.2)

## 2023-08-16 LAB — COMPREHENSIVE METABOLIC PANEL WITH GFR
ALT: 10 U/L (ref 0–44)
AST: 15 U/L (ref 15–41)
Albumin: 3.3 g/dL — ABNORMAL LOW (ref 3.5–5.0)
Alkaline Phosphatase: 78 U/L (ref 38–126)
Anion gap: 13 (ref 5–15)
BUN: 7 mg/dL — ABNORMAL LOW (ref 8–23)
CO2: 21 mmol/L — ABNORMAL LOW (ref 22–32)
Calcium: 8.9 mg/dL (ref 8.9–10.3)
Chloride: 99 mmol/L (ref 98–111)
Creatinine, Ser: 0.86 mg/dL (ref 0.44–1.00)
GFR, Estimated: >60 mL/min (ref >60)
Glucose, Bld: 225 mg/dL — ABNORMAL HIGH (ref 70–99)
Potassium: 3.5 mmol/L (ref 3.5–5.1)
Sodium: 133 mmol/L — ABNORMAL LOW (ref 135–145)
Total Bilirubin: 1.1 mg/dL (ref 0.0–1.2)
Total Protein: 6.4 g/dL — ABNORMAL LOW (ref 6.5–8.1)

## 2023-08-16 LAB — POCT I-STAT, CHEM 8
BUN: 5 mg/dL — ABNORMAL LOW (ref 8–23)
Calcium, Ion: 1.07 mmol/L — ABNORMAL LOW (ref 1.15–1.40)
Chloride: 96 mmol/L — ABNORMAL LOW (ref 98–111)
Creatinine, Ser: 0.8 mg/dL (ref 0.44–1.00)
Glucose, Bld: 222 mg/dL — ABNORMAL HIGH (ref 70–99)
HCT: 41 % (ref 36.0–46.0)
Hemoglobin: 13.9 g/dL (ref 12.0–15.0)
Potassium: 3.3 mmol/L — ABNORMAL LOW (ref 3.5–5.1)
Sodium: 134 mmol/L — ABNORMAL LOW (ref 135–145)
TCO2: 25 mmol/L (ref 22–32)

## 2023-08-16 LAB — URINALYSIS, ROUTINE W REFLEX MICROSCOPIC
Bilirubin Urine: NEGATIVE
Glucose, UA: NEGATIVE mg/dL
Ketones, ur: NEGATIVE mg/dL
Nitrite: POSITIVE — AB
Protein, ur: 100 mg/dL — AB
Specific Gravity, Urine: 1.016 (ref 1.005–1.030)
WBC, UA: >50 WBC/hpf (ref 0–5)
pH: 6 (ref 5.0–8.0)

## 2023-08-16 LAB — GLUCOSE, CAPILLARY
Glucose-Capillary: 185 mg/dL — ABNORMAL HIGH (ref 70–99)
Glucose-Capillary: 87 mg/dL (ref 70–99)

## 2023-08-16 SURGERY — ABDOMINAL AORTOGRAM
Anesthesia: LOCAL

## 2023-08-16 MED ORDER — EZETIMIBE 10 MG PO TABS
10.0000 mg | ORAL_TABLET | Freq: Every day | ORAL | Status: DC
  Administered 2023-08-16 – 2023-08-17 (×2): 10 mg via ORAL
  Filled 2023-08-16 (×2): qty 1

## 2023-08-16 MED ORDER — ALBUTEROL SULFATE HFA 108 (90 BASE) MCG/ACT IN AERS: 2.0000 | INHALATION_SPRAY | Freq: Four times a day (QID) | RESPIRATORY_TRACT | Status: DC | PRN

## 2023-08-16 MED ORDER — HEPARIN SODIUM (PORCINE) 5000 UNIT/ML IJ SOLN
5000.0000 [IU] | Freq: Three times a day (TID) | INTRAMUSCULAR | Status: DC
  Administered 2023-08-16 – 2023-08-17 (×3): 5000 [IU] via SUBCUTANEOUS
  Filled 2023-08-16 (×3): qty 1

## 2023-08-16 MED ORDER — POTASSIUM CHLORIDE CRYS ER 10 MEQ PO TBCR
10.0000 meq | EXTENDED_RELEASE_TABLET | Freq: Every day | ORAL | Status: DC
  Administered 2023-08-16 – 2023-08-17 (×2): 10 meq via ORAL
  Filled 2023-08-16 (×4): qty 1

## 2023-08-16 MED ORDER — METOPROLOL TARTRATE 25 MG PO TABS
25.0000 mg | ORAL_TABLET | Freq: Two times a day (BID) | ORAL | Status: DC
  Administered 2023-08-16 – 2023-08-17 (×3): 25 mg via ORAL
  Filled 2023-08-16 (×3): qty 1

## 2023-08-16 MED ORDER — ASPIRIN 81 MG PO CHEW
81.0000 mg | CHEWABLE_TABLET | Freq: Every day | ORAL | Status: DC
  Administered 2023-08-16 – 2023-08-17 (×2): 81 mg via ORAL
  Filled 2023-08-16 (×2): qty 1

## 2023-08-16 MED ORDER — ISOSORBIDE MONONITRATE ER 30 MG PO TB24
30.0000 mg | ORAL_TABLET | Freq: Two times a day (BID) | ORAL | Status: DC
  Administered 2023-08-16 – 2023-08-17 (×2): 30 mg via ORAL
  Filled 2023-08-16 (×2): qty 2
  Filled 2023-08-16 (×3): qty 1

## 2023-08-16 MED ORDER — NITROGLYCERIN 0.4 MG SL SUBL: 0.4000 mg | SUBLINGUAL_TABLET | SUBLINGUAL | Status: DC | PRN

## 2023-08-16 MED ORDER — NITROFURANTOIN MONOHYD MACRO 100 MG PO CAPS
100.0000 mg | ORAL_CAPSULE | Freq: Two times a day (BID) | ORAL | Status: DC
  Administered 2023-08-16: 100 mg via ORAL
  Filled 2023-08-16 (×2): qty 1

## 2023-08-16 MED ORDER — AMLODIPINE BESYLATE 5 MG PO TABS
5.0000 mg | ORAL_TABLET | Freq: Every day | ORAL | Status: DC
  Administered 2023-08-16 – 2023-08-17 (×2): 5 mg via ORAL
  Filled 2023-08-16 (×2): qty 1

## 2023-08-16 MED ORDER — BUDESON-GLYCOPYRROL-FORMOTEROL 160-9-4.8 MCG/ACT IN AERO
2.0000 | INHALATION_SPRAY | Freq: Two times a day (BID) | RESPIRATORY_TRACT | Status: DC
  Administered 2023-08-16: 2 via RESPIRATORY_TRACT
  Filled 2023-08-16: qty 5.9

## 2023-08-16 MED ORDER — INSULIN ASPART 100 UNIT/ML IJ SOLN
0.0000 [IU] | Freq: Three times a day (TID) | INTRAMUSCULAR | Status: DC
  Administered 2023-08-16 – 2023-08-17 (×2): 3 [IU] via SUBCUTANEOUS

## 2023-08-16 MED ORDER — SODIUM CHLORIDE 0.9 % IV SOLN: INTRAVENOUS | Status: DC

## 2023-08-16 MED ORDER — ENSURE PLUS HIGH PROTEIN PO LIQD
237.0000 mL | Freq: Two times a day (BID) | ORAL | Status: DC
  Administered 2023-08-16 – 2023-08-17 (×2): 237 mL via ORAL

## 2023-08-16 MED ORDER — MONTELUKAST SODIUM 10 MG PO TABS
10.0000 mg | ORAL_TABLET | Freq: Every day | ORAL | Status: DC
  Administered 2023-08-16: 10 mg via ORAL
  Filled 2023-08-16: qty 1

## 2023-08-16 MED ORDER — ADULT MULTIVITAMIN W/MINERALS CH
1.0000 | ORAL_TABLET | Freq: Every day | ORAL | Status: DC
  Administered 2023-08-16 – 2023-08-17 (×2): 1 via ORAL
  Filled 2023-08-16 (×2): qty 1

## 2023-08-16 MED ORDER — LISINOPRIL 10 MG PO TABS
10.0000 mg | ORAL_TABLET | Freq: Every day | ORAL | Status: DC
  Administered 2023-08-16 – 2023-08-17 (×2): 10 mg via ORAL
  Filled 2023-08-16 (×2): qty 1

## 2023-08-16 MED ORDER — VITAMIN D 25 MCG (1000 UNIT) PO TABS
1000.0000 [IU] | ORAL_TABLET | Freq: Every day | ORAL | Status: DC
  Administered 2023-08-16 – 2023-08-17 (×2): 1000 [IU] via ORAL
  Filled 2023-08-16 (×2): qty 1

## 2023-08-16 MED ORDER — SODIUM CHLORIDE 0.9 % IV BOLUS
1000.0000 mL | Freq: Once | INTRAVENOUS | Status: AC
  Administered 2023-08-16: 1000 mL via INTRAVENOUS

## 2023-08-16 MED ORDER — ISOSORBIDE MONONITRATE ER 30 MG PO TB24: 30.0000 mg | ORAL_TABLET | Freq: Every day | ORAL | Status: DC

## 2023-08-16 MED ORDER — FAMOTIDINE 20 MG PO TABS
20.0000 mg | ORAL_TABLET | Freq: Two times a day (BID) | ORAL | Status: DC
  Administered 2023-08-16 (×2): 20 mg via ORAL
  Filled 2023-08-16 (×2): qty 1

## 2023-08-16 MED ORDER — ALBUTEROL SULFATE (2.5 MG/3ML) 0.083% IN NEBU: 2.5000 mg | INHALATION_SOLUTION | Freq: Four times a day (QID) | RESPIRATORY_TRACT | Status: DC | PRN

## 2023-08-16 SURGICAL SUPPLY — 9 items
CATH OMNI FLUSH 5F 65CM (CATHETERS) IMPLANT
COVER DOME SNAP 22 D (MISCELLANEOUS) IMPLANT
KIT MICROPUNCTURE NIT STIFF (SHEATH) IMPLANT
KIT SYRINGE INJ CVI SPIKEX1 (MISCELLANEOUS) IMPLANT
SET ATX-X65L (MISCELLANEOUS) IMPLANT
SHEATH PINNACLE 5F 10CM (SHEATH) IMPLANT
SHEATH PROBE COVER 6X72 (BAG) IMPLANT
TRAY PV CATH (CUSTOM PROCEDURE TRAY) ×2 IMPLANT
WIRE BENTSON .035X145CM (WIRE) IMPLANT

## 2023-08-16 NOTE — ED Notes (Signed)
 Assisted pt to [facility].

## 2023-08-16 NOTE — ED Provider Notes (Signed)
 [place] EMERGENCY DEPARTMENT AT [facility] Provider Note   CSN: [id number] Arrival date & time: 08/16/23  4098     History  Chief Complaint  Patient presents with   Dysphagia    Brandy Hall is a 63 y.o. female.  HPI 63 year old female presents from the operating room for evaluation of trouble swallowing.  The patient has been having progressive difficulty swallowing since June 8.  Patient has had this problem before and had to have her esophagus stretched, most recently was years ago in [place].  The patient was due to have a vascular procedure but given her difficulty swallowing pills her procedure was canceled and she was sent to the ER.  Patient states it takes hours for her pills to go down as they get stuck.  2 days ago she had a hamburger and it ended up coming all the way back up.  She states that she is able to swallow only fluids and soup.  The meds will eventually go down but it takes hours.  No chest pain currently though there is a discomfort in her chest whenever something is stuck.  No trouble breathing or fever.  No abdominal pain.  Home Medications Prior to Admission medications   Medication Sig Start Date End Date Taking? Authorizing Provider  albuterol  (PROVENTIL  HFA;VENTOLIN  HFA) 108 (90 BASE) MCG/ACT inhaler Inhale 2 puffs into the lungs every 6 (six) hours as needed for wheezing or shortness of breath.   Yes [provider]  amLODipine (NORVASC) 5 MG tablet Take 5 mg by mouth.   Yes [provider]  ASPIRIN 81 PO 81 mg once a day   Yes [provider]  cholecalciferol (VITAMIN D) 1000 UNITS tablet Take 1,000 Units by mouth daily.   Yes [provider]  clopidogrel (PLAVIX) 75 MG tablet Take 75 mg by mouth.   Yes [provider]  ezetimibe (ZETIA) 10 MG tablet Take 10 mg by mouth daily. 06/28/23  Yes [provider]  famotidine (PEPCID) 20 MG tablet 20 mg per 1 tablet, ORAL, BID (2 times a day),  0 Refill(s) 08/09/19  Yes [provider]  Fluticasone-Umeclidin-Vilant (TRELEGY ELLIPTA) 200-62.5-25 MCG/ACT AEPB Inhale 1 puff into the lungs daily in the afternoon.   Yes [provider]  Insulin Aspart FlexPen (NOVOLOG) 100 UNIT/ML Inject 0-2 Units into the skin See admin instructions. 08/14/23  Yes [provider]  isosorbide mononitrate (IMDUR) 30 MG 24 hr tablet Take 30 mg by mouth 2 (two) times daily.    Yes [provider]  LANTUS SOLOSTAR 100 UNIT/ML Solostar Pen 24 units sq once a day with meals 07/21/15  Yes [provider]  lisinopril (ZESTRIL) 5 MG tablet Take 10 mg by mouth daily. 07/17/23  Yes [provider]  metoprolol tartrate (LOPRESSOR) 25 MG tablet Take 25 mg by mouth 2 (two) times daily.   Yes [provider]  montelukast (SINGULAIR) 10 MG tablet Take 1 tablet by mouth at bedtime.   Yes [provider]  Multiple Vitamin (MULTIVITAMIN) capsule Take 1 capsule by mouth daily.   Yes [provider]  nitroGLYCERIN (NITROSTAT) 0.4 MG SL tablet Place 0.4 mg under the tongue every 5 (five) minutes as needed for chest pain.   Yes [provider]  potassium chloride (KLOR-CON) 10 MEQ tablet Take 10 mEq by mouth daily.   Yes [provider]  RANEXA 500 MG 12 hr tablet Take 500 mg by mouth 2 (two) times daily.  08/08/23  Yes [provider]  guaiFENesin-codeine 100-10 MG/5ML syrup Take 10 mLs by mouth 3 (three) times daily as needed for cough. Patient not taking: Reported on 08/16/2023 06/30/23   [provider]      Allergies    Rosuvastatin, Dulaglutide, Oxycodone-acetaminophen, Oxycodone-acetaminophen, and Statins    Review of Systems   Review of Systems  Constitutional:  Negative for fever.  Respiratory:  Negative for shortness of breath.   Gastrointestinal:  Negative for abdominal pain.    Physical Exam Updated Vital Signs BP (!) 155/102 (BP Location: Right Arm)    Pulse 89   Temp 97.8 F (36.6 C)   Resp 16   Ht 5' 8 (1.727 m)   Wt 87.5 kg   SpO2 100%   BMI 29.35 kg/m  Physical Exam Vitals and nursing note reviewed.  Constitutional:      General: She is not in acute distress.    Appearance: She is well-developed. She is not ill-appearing or diaphoretic.  HENT:     Head: Normocephalic and atraumatic.  Cardiovascular:     Rate and Rhythm: Normal rate and regular rhythm.     Heart sounds: Normal heart sounds.  Pulmonary:     Effort: Pulmonary effort is normal.     Breath sounds: Normal breath sounds.  Abdominal:     General: There is no distension.     Palpations: Abdomen is soft.     Tenderness: There is no abdominal tenderness.  Skin:    General: Skin is warm and dry.  Neurological:     Mental Status: She is alert.     ED Results / Procedures / Treatments   Labs (all labs ordered are listed, but only abnormal results are displayed) Labs Reviewed  COMPREHENSIVE METABOLIC PANEL WITH GFR - Abnormal; Notable for the following components:      Result Value   Sodium 133 (*)    CO2 21 (*)    Glucose, Bld 225 (*)    BUN 7 (*)    Total Protein 6.4 (*)    Albumin 3.3 (*)    All other components within normal limits  CBC WITH DIFFERENTIAL/PLATELET - Abnormal; Notable for the following components:   RBC 3.70 (*)    MCV 105.4 (*)    MCH 35.7 (*)    All other components within normal limits    EKG None  Radiology PERIPHERAL VASCULAR CATHETERIZATION Result Date: 08/16/2023 Case cancelled due to chest pain    Procedures Procedures    Medications Ordered in ED Medications  0.9 %  sodium chloride infusion ( Intravenous New Bag/Given 08/16/23 1159)  heparin injection 5,000 Units (has no administration in time range)  sodium chloride 0.9 % bolus 1,000 mL (0 mLs Intravenous Stopped 08/16/23 1202)    ED Course/ Medical Decision Making/ A&P                                 Medical Decision Making Amount and/or Complexity of  Data Reviewed External Data Reviewed: notes. Labs: ordered.    Details: Unremarkable electrolytes.  Normal hemoglobin  Risk Decision regarding hospitalization.   Patient presents with progressive dysphagia to the point that is very difficult for her to swallow her pills.  She is unable to swallow food.  Discussed with Mai Schwalbe of GI, unfortunately due to her Plavix use yesterday she cannot get an EGD today and dilation would be difficult to do within  the next 5 days until her Plavix washout.  However they tentatively plan to do an EGD tomorrow to see if she will need dilation as if she does not then she can go home.  Otherwise will need admission for this.  Discussed with the internal medicine teaching service who will admit.        Final Clinical Impression(s) / ED Diagnoses Final diagnoses:  Dysphagia, unspecified type    Rx / DC Orders ED Discharge Orders     None         Jerilynn Montenegro, MD 08/16/23 1310

## 2023-08-16 NOTE — H&P (Addendum)
 Date: 08/16/2023               Patient Name:  Brandy Hall MRN: [id number]  DOB: Sep 03, 1960 Age / Sex: 63 y.o., female   PCP: Ardella Beaver, MD         Medical Service: Internal Medicine Teaching Service         Attending Physician: Dr. Bevelyn Bryant, MD      First Contact: Dr. Lanney Pitts, DO Pager [phone]    Second Contact: Dr. Rozelle Corning          After Hours (After 5p/  First Contact Pager: [phone]  weekends / holidays): Second Contact Pager: [phone]   SUBJECTIVE   Chief Complaint: Dysphagia   History of Present Illness:   This is a 63 year old female with past medical history of hypertension, hyperlipidemia, diabetes, COPD, palpitations, sleep apnea, IBS, GERD, history of RCA stent in 2016 and cath in January 2020, PAD who presented to the ER today after being sent from the vascular vein specially with concerns about acute dysphagia.  Patient reports that her symptoms initially started on Sunday morning, she had trouble swallowing whole food, she regurgitated right afterwards. Denies any throat pain, reports mild sore throat. States that when she took her meds on Sunday, she felt like her medication were stuck in her chest. Able to tolerate liquid and soups, but no solids.  Does report previous history of EGDs requiring dilation x 2, last procedure about 4 years ago. Reports that symptoms are similar, but time is abrupt in onset this time. Patient also endorses symptoms of loose stools since Saturday, and nausea and vomiting starting Sunday. No fevers. Notes chills for a long period of time.  No recent sick contacts.  No abdominal pain.  No melena hematochezia.  Reports chronic cough with phlegm production, denies any worsening.  Denies any chest pain shortness of breath. Also endorses increase in urine frequency, no dysuria. She thinks that she might have UTI.   ED Course: EDP consulted GI   Past Medical History: Past Medical History:  Diagnosis Date   Allergic rhinitis     COPD (chronic obstructive pulmonary disease) (HCC)    Diabetes (HCC)    GERD (gastroesophageal reflux disease)    History of colon polyps    Hypercholesteremia    Hypertension    IBS (irritable bowel syndrome)    MI (myocardial infarction) (HCC) 2016   Seasonal allergies    Trigeminal neuralgia     Meds:  Current Outpatient Medications  Medication Instructions   albuterol  (PROVENTIL  HFA;VENTOLIN  HFA) 108 (90 BASE) MCG/ACT inhaler 2 puffs, Inhalation, Every 6 hours PRN   amLODipine (NORVASC) 5 mg, Oral   ASPIRIN 81 PO 81 mg once a day   cholecalciferol (VITAMIN D) 1,000 Units, Oral, Daily   clopidogrel (PLAVIX) 75 mg, Oral   ezetimibe (ZETIA) 10 mg, Oral, Daily   famotidine (PEPCID) 20 MG tablet 20 mg per 1 tablet, ORAL, BID (2 times a day), 0 Refill(s)   Fluticasone-Umeclidin-Vilant (TRELEGY ELLIPTA) 200-62.5-25 MCG/ACT AEPB 1 puff, Inhalation, Daily   guaiFENesin-codeine 100-10 MG/5ML syrup 10 mLs, 3 times daily PRN   Insulin Aspart FlexPen (NOVOLOG) 0-2 Units, Subcutaneous, See admin instructions   isosorbide mononitrate (IMDUR) 30 mg, Oral, 2 times daily   LANTUS SOLOSTAR 100 UNIT/ML Solostar Pen 24 units sq once a day with meals   lisinopril (ZESTRIL) 10 mg, Oral, Daily   metoprolol tartrate (LOPRESSOR) 25 mg, Oral, 2 times daily   montelukast (SINGULAIR) 10 MG tablet  1 tablet, Oral, Daily at bedtime   Multiple Vitamin (MULTIVITAMIN) capsule 1 capsule, Oral, Daily   nitroGLYCERIN (NITROSTAT) 0.4 mg, Sublingual, Every 5 min PRN   potassium chloride (KLOR-CON) 10 MEQ tablet 10 mEq, Oral, Daily   Ranexa 500 mg, Oral, 2 times daily    Past Surgical History:  Procedure Laterality Date   ABDOMINAL HYSTERECTOMY     BREAST CYST ASPIRATION Right    COLONOSCOPY  09/15/2014   [facility]. Multiple small polyps in the transverse and sigmoid colon, not removed today as the patient is on clopidogrel   ELBOW SURGERY     bilat   ESOPHAGOGASTRODUODENOSCOPY  02/06/2014   Small hiatal  hernia. Mild gastritis. Gastric polyp status post polypectomy.   GALLBLADDER SURGERY     KNEE SURGERY     left   LAPAROSCOPY  1999   showed endometriosis   OOPHORECTOMY  11/15/2013   Dr Aneta Bar- and salpingectomy   ROTATOR CUFF REPAIR     left    Social:  Living Situation: [place] Functionality: Mostly dependent on her ADLs and IADLs.  Uses a walker to ambulate, has had falls previously. PCP: Ardella Beaver, MD Tobacco: Smokes 6 cigarettes daily Alcohol: Drinks 2-3 shots about 2 times a week. Drugs: None  Family History: noncontributory  Allergies: Allergies as of 08/16/2023 - Review Complete 08/16/2023  Allergen Reaction Noted   Rosuvastatin  09/01/2020   Dulaglutide Nausea Only, Nausea And Vomiting, and Other (See Comments) 06/27/2019   Oxycodone-acetaminophen Hives 11/12/2013   Oxycodone-acetaminophen Other (See Comments) 06/04/2023   Statins Other (See Comments) 06/02/2023    Review of Systems: A complete ROS was negative except as per HPI.   OBJECTIVE:   Physical Exam: Blood pressure (!) 155/102, pulse 89, temperature 97.9 F (36.6 C), temperature source Oral, resp. rate 16, height 5' 8 (1.727 m), weight 87.5 kg, SpO2 100%.  Constitutional: Appears well, laying in bed, no acute distress  HENT: normocephalic atraumatic, mucous membranes moist, +hyperpigmentation along the posterior oropharynx, unable to visualize uvula.  Eyes: conjunctiva non-erythematous Neck: supple, no lymphadenopathy noted  Cardiovascular: regular rate and rhythm, no m/r/g Pulmonary/Chest: normal work of breathing on room air, lungs clear to auscultation bilaterally with upper airway sounds  Abdominal: soft, non-tender, non-distended, bowel sounds present MSK: normal bulk and tone, no lower extremity edema bilaterally Neurological: alert & oriented x 3, answering questions appropriately no focal deficits Skin: warm and dry Psych: Pleasant  Labs: CBC    Component Value Date/Time    WBC 5.0 08/16/2023 0952   RBC 3.70 (L) 08/16/2023 0952   HGB 13.2 08/16/2023 0952   HCT 39.0 08/16/2023 0952   PLT 156 08/16/2023 0952   MCV 105.4 (H) 08/16/2023 0952   MCH 35.7 (H) 08/16/2023 0952   MCHC 33.8 08/16/2023 0952   RDW 14.8 08/16/2023 0952   LYMPHSABS 1.1 08/16/2023 0952   MONOABS 0.7 08/16/2023 0952   EOSABS 0.0 08/16/2023 0952   BASOSABS 0.0 08/16/2023 0952     CMP     Component Value Date/Time   NA 133 (L) 08/16/2023 0952   K 3.5 08/16/2023 0952   CL 99 08/16/2023 0952   CO2 21 (L) 08/16/2023 0952   GLUCOSE 225 (H) 08/16/2023 0952   BUN 7 (L) 08/16/2023 0952   CREATININE 0.86 08/16/2023 0952   CALCIUM 8.9 08/16/2023 0952   PROT 6.4 (L) 08/16/2023 0952   ALBUMIN 3.3 (L) 08/16/2023 0952   AST 15 08/16/2023 0952   ALT 10 08/16/2023 0952   ALKPHOS  78 08/16/2023 0952   BILITOT 1.1 08/16/2023 0952   GFRNONAA >60 08/16/2023 9528    Imaging: PERIPHERAL VASCULAR CATHETERIZATION Result Date: 08/16/2023 Case cancelled due to chest pain    EKG: personally reviewed my interpretation is NSR, ventricular rate 96, QTc 505.  ASSESSMENT & PLAN:   Assessment & Plan by Problem: Principal Problem:   Dysphagia   DIVINE IMBER is a 63 y.o. person living with a history of  hypertension, hyperlipidemia, diabetes, COPD, palpitations, sleep apnea, IBS, GERD, history of RCA stent in 2016 and cath in January 2020, PAD who presented to the ER today after being sent from the vascular vein specially with concerns about acute dysphagia.   Acute esophageal dysphagia History of GERD  Presents with 4 days of dysphagia like symptoms with food getting stuck and regurgitation. Also endorses symptoms of NBNB emesis and loose stools for the past couple of days. No fevers or sick contacts. Does have prior history of dilation x 2 and GERD. She is a smoker. Differentials include but not limited to esophageal strictures/ring/web vs masses vs eosinophilic esophagitis. No lymph adenopathy  noted on exam. EDP consulted GI. Patient's last dose of plavix was yesterday 6/10, will need 5 day  wash out prior to dilation per GI. Patient is currently HDS, saturating on RA and vitals stable.  - Appreciate GI recommendations   - Hold Plavix  - Plan for diagnostic EGD 6/12 to evaluate for infection, NPO at midnight  - If not NPO, then FLD - Continue home medication pepcid 20 mg BID as crushed   Suspect UTI  Reports increase in urine frequency, no dysuria. States that she feels like she has UTI, it usually starts with increase in urinary frequency. No suprapubic tenderness.  - F/u UA  PAD  Follows [facility], was planned for diagnostic abdominal aortogram RLE 6/11, but procedure was not performed due to acute dysphagia symptoms. Does report history of recurrent falls, with last fall last week. Stated that she fell backwards on her head. No dizzy spells or lightheadedness. Reports that it is d/t bilateral LE weakness, uses walker as mobility device. Home medication consisted of Plavix and Aspirin.   - Hold Plavix  - Continue Aspirin 81 mg daily as crushed  - PT/OT eval and treat    T2DM  Per chart review, A1c 8.4 noted on 05/12/2023; outpatient medication regimen includes Novolog SSI. Reports that she is not taking Lantus 24 units and has not checked CBG at home. Appears to be noncompliant with insulin. - Glucose 225 on CMP  - SSI moderate   HTN Outpatient medication regimen includes amlodipine 5 mg daily, lisinopril 10 mg daily, metoprolol 25 mg twice daily, Imdur 30 mg twice daily.  - Per pharmacy, not recommended to cut tablets in half, to give as crushed except for IMDUR - Continue home medications amlodipine 5 mg daily, lisinopril 10 mg daily, metoprolol 25 mg twice daily as crushed, mixed with liquids - Continue Imdur 30 mg twice daily, give it cut in half  CAD Hyperlipidemia Outpatient medication regimen includes Zetia 10 mg daily and Ranexa BID. - Continue Zetia, mix it with Ensure or  liquids  - Hold Ranexa for now    Tobacco Use Smokes 6 cigarettes daily, smoking cessation counseling is provided.   Prolonged QTc EKG showed QTc 505, awake and disabling medication.  No chest pain or shortness of breath.  History of COPD Outpatient medication regimen includes Trelegy inhaler daily, albuterol , and Singulair 10 mg daily  On exam, no shortness of breath or wheezing noted. - Continue Trelegy daily  - Continue Albuterol  PRN   Code: Full Confirmed by Patient.   Diet: CLD VTE: Heparin IVF: None  Prior to Admission Living Arrangement: Home, living self Anticipated Discharge Location: Home Barriers to Discharge: EGD  Dispo: Admit patient to Inpatient with expected length of stay greater than 2 midnights.  Signed: Lanney Pitts, DO Internal Medicine Resident PGY-1  08/16/2023, 1:58 PM

## 2023-08-16 NOTE — Consult Note (Addendum)
 Consultation  Referring Provider: Dr. Aldean Amass    Primary Care Physician:  Ardella Beaver, MD Primary Gastroenterologist:  [facility] Reason for Consultation: Dysphagia              HPI:   Brandy Hall is a 63 y.o. female with a past medical history as listed below including COPD, diabetes, GERD and IBS, who presented to the ER today after being sent here from the vascular and vein specialist discussing some dysphagia symptoms.    This morning patient was seen by the vascular and vein specialist for some right great toe pain.  Apparently there was some concern the pain was due to peripheral arterial disease.  At that time they recommended diagnostic angiography with possible intervention in the right lower extremity prior to toenail removal.  There was a nonpalpable pulse in the foot.  She was scheduled for angiography with possible stent.  Prior to the procedure though patient started complaining of 6 out of 10 chest pain for the past 3 days.  EKG not concerning evaluated by cardiology.  On further eval she states she was unable to swallow over the last 3 days, only drinking soup, apparently has history of esophageal dilation in [place].  Unable to take her medications due to the inability to swallow.  It was discussed that because she would need dual antiplatelet medication after the procedure they needed to come to the emergency department for possible intervention prior to proceeding with vascular intervention.    Today, patient seen with her daughter by her bedside.  She explains that she went to the vascular surgeon today to help her foot, but instead ended up here in the ER.  At the time of that appointment tells me that she told them 3 days ago acutely she woke up on Sunday tried to eat and it came right back up.  She then took some pills but they just sat in her throat for hours, so she stopped eating or drinking anything solid.  She has just been getting soups down and liquids.   She does tell me that it hurts a little bit in her mid chest over her sternal area.  Also had diarrhea it sounds like on Friday night 08/11/2023 that lasted all night long and then half of the day Saturday and now has just been coming on occasionally.  Apparently over the past couple of days has decreased a little bit.      Has also noticed that her urine is cloudy with some discomfort and thinks she may have a UTI.  Patient does tell me that she uses inhalers for her COPD and never rinses or washes.      She does recall a history of esophageal dilations the last 4 years ago in [place].  Tells me that symptoms do not usually come on this quickly, it is typically more gradual.    Last dose of Plavix was 08/15/2023 AM    Denies fever, chills or weight loss.  GI history: [facility]-cannot see actual report 07/29/2020 blood thinner clearance from [facility]  Past Medical History:  Diagnosis Date   Allergic rhinitis    COPD (chronic obstructive pulmonary disease) (HCC)    Diabetes (HCC)    GERD (gastroesophageal reflux disease)    History of colon polyps    Hypercholesteremia    Hypertension    IBS (irritable bowel syndrome)    Seasonal allergies    Trigeminal neuralgia  Past Surgical History:  Procedure Laterality Date   ABDOMINAL HYSTERECTOMY     BREAST CYST ASPIRATION Right    COLONOSCOPY  09/15/2014   [facility]. Multiple small polyps in the transverse and sigmoid colon, not removed today as the patient is on clopidogrel   ELBOW SURGERY     bilat   ESOPHAGOGASTRODUODENOSCOPY  02/06/2014   Small hiatal hernia. Mild gastritis. Gastric polyp status post polypectomy.   GALLBLADDER SURGERY     KNEE SURGERY     left   LAPAROSCOPY  1999   showed endometriosis   OOPHORECTOMY  11/15/2013   Dr Aneta Bar- and salpingectomy   ROTATOR CUFF REPAIR     left    History reviewed. No pertinent family history.   Social History   Tobacco Use   Smoking  status: Every Day    Current packs/day: 0.50    Average packs/day: 0.5 packs/day for 25.0 years (12.5 ttl pk-yrs)    Types: Cigarettes   Smokeless tobacco: Never  Vaping Use   Vaping status: Never Used  Substance Use Topics   Alcohol use: No   Drug use: Never    Prior to Admission medications   Medication Sig Start Date End Date Taking? Authorizing Provider  albuterol  (PROVENTIL  HFA;VENTOLIN  HFA) 108 (90 BASE) MCG/ACT inhaler Inhale 2 puffs into the lungs every 6 (six) hours as needed for wheezing or shortness of breath.    [provider]  amLODipine (NORVASC) 5 MG tablet Take 5 mg by mouth. Patient not taking: Reported on 07/27/2023    [provider]  ASPIRIN 81 PO 81 mg once a day    [provider]  cholecalciferol (VITAMIN D) 1000 UNITS tablet Take 1,000 Units by mouth daily.    [provider]  clopidogrel (PLAVIX) 75 MG tablet Take 75 mg by mouth.    [provider]  famotidine (PEPCID) 20 MG tablet 20 mg per 1 tablet, ORAL, BID (2 times a day), 0 Refill(s) 08/09/19   [provider]  insulin aspart (NOVOLOG) 100 UNIT/ML injection  02/27/23   [provider]  isosorbide mononitrate (IMDUR) 30 MG 24 hr tablet Take 30 mg by mouth 2 (two) times daily.     [provider]  LANTUS SOLOSTAR 100 UNIT/ML Solostar Pen 24 units sq once a day with meals 07/21/15   [provider]  lisinopril-hydrochlorothiazide (PRINZIDE,ZESTORETIC) 20-25 MG per tablet Take 1 tablet daily by mouth 10/21/13   [provider]  metoprolol tartrate (LOPRESSOR) 25 MG tablet Take 25 mg by mouth 2 (two) times daily.    [provider]  mometasone -formoterol  (DULERA) 200-5 MCG/ACT AERO Inhale 2 puffs into the lungs 2 (two) times daily. 09/30/14   Vernell Goldsmith, MD  Multiple Vitamin (MULTIVITAMIN) capsule Take 1 capsule by mouth daily.    [provider]  nitroGLYCERIN (NITROSTAT) 0.4 MG SL tablet Place 0.4 mg  under the tongue.    [provider]  ondansetron (ZOFRAN) 4 MG tablet TAKE 1 TABLET(S) BY MOUTH 3 TIMES/DAY AS NEEDED FOR NAUSEA AND VOMITING 09/19/14   [provider]  ranolazine (RANEXA) 1000 MG SR tablet Take 500 mg by mouth.    [provider]  SitaGLIPtin-MetFORMIN HCl (JANUMET XR) 50-1000 MG TB24 Take 1 tablet by mouth twice a day with meals 10/25/13   [provider]    No current facility-administered medications for this encounter.   Current Outpatient Medications  Medication Sig Dispense Refill   albuterol  (PROVENTIL  HFA;VENTOLIN  HFA) 108 (  90 BASE) MCG/ACT inhaler Inhale 2 puffs into the lungs every 6 (six) hours as needed for wheezing or shortness of breath.     amLODipine (NORVASC) 5 MG tablet Take 5 mg by mouth. (Patient not taking: Reported on 07/27/2023)     ASPIRIN 81 PO 81 mg once a day     cholecalciferol (VITAMIN D) 1000 UNITS tablet Take 1,000 Units by mouth daily.     clopidogrel (PLAVIX) 75 MG tablet Take 75 mg by mouth.     famotidine (PEPCID) 20 MG tablet 20 mg per 1 tablet, ORAL, BID (2 times a day), 0 Refill(s)     insulin aspart (NOVOLOG) 100 UNIT/ML injection      isosorbide mononitrate (IMDUR) 30 MG 24 hr tablet Take 30 mg by mouth 2 (two) times daily.      LANTUS SOLOSTAR 100 UNIT/ML Solostar Pen 24 units sq once a day with meals     lisinopril-hydrochlorothiazide (PRINZIDE,ZESTORETIC) 20-25 MG per tablet Take 1 tablet daily by mouth     metoprolol tartrate (LOPRESSOR) 25 MG tablet Take 25 mg by mouth 2 (two) times daily.     mometasone -formoterol  (DULERA) 200-5 MCG/ACT AERO Inhale 2 puffs into the lungs 2 (two) times daily. 1 Inhaler 6   Multiple Vitamin (MULTIVITAMIN) capsule Take 1 capsule by mouth daily.     nitroGLYCERIN (NITROSTAT) 0.4 MG SL tablet Place 0.4 mg under the tongue.     ondansetron (ZOFRAN) 4 MG tablet TAKE 1 TABLET(S) BY MOUTH 3 TIMES/DAY AS NEEDED FOR NAUSEA AND VOMITING  0   ranolazine (RANEXA) 1000 MG SR  tablet Take 500 mg by mouth.     SitaGLIPtin-MetFORMIN HCl (JANUMET XR) 50-1000 MG TB24 Take 1 tablet by mouth twice a day with meals      Allergies as of 08/16/2023 - Review Complete 08/16/2023  Allergen Reaction Noted   Rosuvastatin  09/01/2020   Dulaglutide Nausea Only, Nausea And Vomiting, and Other (See Comments) 06/27/2019   Oxycodone-acetaminophen Hives 11/12/2013   Oxycodone-acetaminophen Other (See Comments) 06/04/2023   Statins Other (See Comments) 06/02/2023     Review of Systems:    Constitutional: No weight loss, fever or chills Skin: No rash  Cardiovascular: No chest pain Respiratory: No SOB  Gastrointestinal: See HPI and otherwise negative Genitourinary: No dysuria  Neurological: No headache, dizziness or syncope Musculoskeletal: No new muscle or joint pain Hematologic: No bleeding  Psychiatric: No history of depression or anxiety    Physical Exam:  Vital signs in last 24 hours: Temp:  [97.9 F (36.6 C)-98.3 F (36.8 C)] 97.9 F (36.6 C) (06/11 0840) Pulse Rate:  [0-96] 95 (06/11 0840) Resp:  [17-22] 20 (06/11 0840) BP: (133-136)/(85-99) 133/99 (06/11 0840) SpO2:  [97 %-100 %] 100 % (06/11 0840) Weight:  [83 kg-87.5 kg] 87.5 kg (06/11 0841)   General:   Pleasant AA female appears to be in NAD, Well developed, Well nourished, alert and cooperative Head:  Normocephalic and atraumatic. Eyes:   PEERL, EOMI. No icterus. Conjunctiva pink. Ears:  Normal auditory acuity. Neck:  Supple Throat: Oral cavity and pharynx without inflammation, swelling or lesion. Teeth in good condition. Lungs: Respirations even and unlabored. Lungs clear to auscultation bilaterally.   +chronic cough Heart: Normal S1, S2. No MRG. Regular rate and rhythm. No peripheral edema, cyanosis or pallor.  Abdomen:  Soft, nondistended, mild epigastric/right upper quadrant TTP, no rebound or guarding. Normal bowel sounds. No appreciable masses or hepatomegaly. Rectal:  Not performed.  Msk:   Symmetrical without gross  deformities. Peripheral pulses intact.  Extremities:  Without edema, no deformity or joint abnormality. Normal ROM, normal sensation. Neurologic:  Alert and  oriented x4;  grossly normal neurologically.  Skin:   Dry and intact without significant lesions or rashes. Psychiatric: Demonstrates good judgement and reason without abnormal affect or behaviors.  LAB RESULTS: Recent Labs    08/16/23 0747 08/16/23 0952  WBC  --  5.0  HGB 13.9 13.2  HCT 41.0 39.0  PLT  --  156   BMET Recent Labs    08/16/23 0747  NA 134*  K 3.3*  CL 96*  GLUCOSE 222*  BUN 5*  CREATININE 0.80     PREVIOUS ENDOSCOPIES:            See HPI   Impression / Plan:   Impression: 1.  Dysphagia: Acute onset over the past 3 days, also some diarrhea and cloudy urine, has been able to tolerate soups and liquids, prior history of esophageal strictures last 4 years ago, but those had gradual onset; concern for viral or bacterial esophagitis +/- candidiasis given inhaler usage versus stricture 2.  Diarrhea: For about 48 hours, now occurs here and there per patient; possibly part of a viral etiology for symptoms 3.  Esophageal pain: See above 4.  Right great toe pain: With concern for arterial compromise, originally came into the hospital today for intervention, but did not have this done due to dysphagia and inability to take dual antiplatelet therapy after  Plan: 1.  Continue to hold Plavix, last dose 08/15/2023 AM 2.  Patient can tolerate liquids.  Per Dr. Glorianne Proctor we will proceed with diagnostic EGD tomorrow.  May not be able to dilate on a blood thinner, but can ensure there is not a superimposed infection.  Did discuss risk of benefits, notations and alternatives and patient agrees to proceed. 3.  Continue other forted measures. 4. Ok for clear liquid diet at this point, will make n.p.o. after midnight  Thank you for your kind consultation, we will continue to follow.  Kathy Parker  [facility]  08/16/2023, 10:23 AM   Attending physician's note  I personally saw the patient and performed a substantive portion of this encounter (>50% time spent), including a complete performance of at least one of the key components (MDM, Hx and/or Exam), in conjunction with the APP.  I agree with the APP's note, impression, and recommendations with additional input as follows.     63 year old female with history of peripheral vascular disease on aspirin and Plavix with complaints of acute onset pill dysphagia in the past 3 days, she is only able to tolerate liquid diet Will plan to proceed with EGD tomorrow morning to evaluate, exclude erosive esophagitis, peptic stricture or esophageal candidiasis Continue clear diet, n.p.o. after midnight PPI IV twice daily The risks and benefits as well as alternatives of endoscopic procedure(s) have been discussed and reviewed. All questions answered. The patient agrees to proceed.   The patient was provided an opportunity to ask questions and all were answered. The patient agreed with the plan and demonstrated an understanding of the instructions.  Lorena Rolling , MD [phone]

## 2023-08-16 NOTE — H&P (Signed)
 Prior to the procedure today, Brandy Hall was complaining of 6 out of 10 chest pain.  She stated it has been present for the last 3 days. EKG was not concerning, and was evaluated by cardiology. On further evaluation, she stated that she has been unable to swallow for the last 3 days, only drinking soup.  She has a history of esophageal dilation in [place].  She has been unable to take her medications due to the inability to swallow, and stated the last time she tried, feels were stuck in her throat for hours. We had a long discussion that if I performed an intervention, dual antiplatelet medication would be a necessity.   Being that she is unable to swallow, unable to take her medications, unable to eat anything other than soups, I think that she needs immediate evaluation. I have canceled her case, and we will send her to the emergency department for evaluation and further workup.  Surgery timing will be pending PO intake and can be scheduled again in the outpatient setting.  Kayla Part MD       Office Note     CC:  Right toe pain with PAD Requesting Provider:  No ref. provider found  HPI: Brandy Hall is a 63 y.o. (1960/12/29) female presenting at the request of Dia McCaughan D.P.M.  for right great toe pain.  A few months ago, the patient was seen in podiatry clinic for right great toe pain.  Has previous history of nail removed 20 years ago.  She felt that with the nail growing back, it was similar to pain than what she had previously.  The toe was evaluated, and there was some concern that the pain was due to peripheral arterial disease, therefore she was sent to our clinic.  One exam, [place] was doing well, accompanied by her daughter.  A native vascular, she [place] to live [place].  She worked for the [employer] for years prior to retirement.  She now collects pension.  Regarding right toe pain.  The pain is not constant.  It waxes and wanes.  It usually occurs a few times per  week, and is aggravated when she hits her toe on something.  The pain resolves with time.  She denies open wound, erythema, induration.  She is a daily cigarette smoker with history of MI longstanding diabetes-most recent A1c 8.8.  At baseline, Brandy Hall has difficulty with ambulation.  She notes unsteadiness in the right leg.  She denies history of claudication, ischemic rest pain, tissue loss. She can ambulate roughly 10 feet at a time.  The pt is not on a statin for cholesterol management.  The pt is  on a daily aspirin.   Other AC:  - The pt is  on medication for hypertension.   The pt is not diabetic.  Tobacco hx:  current  Past Medical History:  Diagnosis Date   Allergic rhinitis    COPD (chronic obstructive pulmonary disease) (HCC)    Diabetes (HCC)    GERD (gastroesophageal reflux disease)    History of colon polyps    Hypercholesteremia    Hypertension    IBS (irritable bowel syndrome)    Seasonal allergies    Trigeminal neuralgia     Past Surgical History:  Procedure Laterality Date   ABDOMINAL HYSTERECTOMY     BREAST CYST ASPIRATION Right    COLONOSCOPY  09/15/2014   [facility]. Multiple small polyps in the transverse and sigmoid colon, not removed today as  the patient is on clopidogrel   ELBOW SURGERY     bilat   ESOPHAGOGASTRODUODENOSCOPY  02/06/2014   Small hiatal hernia. Mild gastritis. Gastric polyp status post polypectomy.   GALLBLADDER SURGERY     KNEE SURGERY     left   LAPAROSCOPY  1999   showed endometriosis   OOPHORECTOMY  11/15/2013   Dr Aneta Bar- and salpingectomy   ROTATOR CUFF REPAIR     left    Social History   Socioeconomic History   Marital status: Single    Spouse name: Not on file   Number of children: Not on file   Years of education: Not on file   Highest education level: Not on file  Occupational History   Not on file  Tobacco Use   Smoking status: Every Day    Current packs/day: 0.50    Average packs/day: 0.5 packs/day for  25.0 years (12.5 ttl pk-yrs)    Types: Cigarettes   Smokeless tobacco: Never  Vaping Use   Vaping status: Never Used  Substance and Sexual Activity   Alcohol use: No   Drug use: Never   Sexual activity: Not on file  Other Topics Concern   Not on file  Social History Narrative   Single Lives with grandchild.   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  Current Facility-Administered Medications  Medication Dose Route Frequency Provider Last Rate Last Admin   0.9 %  sodium chloride infusion   Intravenous Continuous Kayla Part, MD        Allergies  Allergen Reactions   Rosuvastatin     Other Reaction(s): myalgias (muscle pain)  rosuvastatin   Dulaglutide Nausea Only, Nausea And Vomiting and Other (See Comments)    Pancreatitis per pt  Trulicity   Oxycodone-Acetaminophen Hives   Oxycodone-Acetaminophen Other (See Comments)   Statins Other (See Comments)    Stated shutting down organs.      REVIEW OF SYSTEMS:  [X]  denotes positive finding, [ ]  denotes negative finding Cardiac  Comments:  Chest pain or chest pressure:    Shortness of breath upon exertion:    Short of breath when lying flat:    Irregular heart rhythm:        Vascular    Pain in calf, thigh, or hip brought on by ambulation:    Pain in feet at night that wakes you up from your sleep:     Blood clot in your veins:    Leg swelling:         Pulmonary    Oxygen at home:    Productive cough:     Wheezing:         Neurologic    Sudden weakness in arms or legs:     Sudden numbness in arms or legs:     Sudden onset of difficulty speaking or slurred speech:    Temporary loss of vision in one eye:     Problems with dizziness:          Gastrointestinal    Blood in stool:     Vomited blood:         Genitourinary    Burning when urinating:  Blood in urine:        Psychiatric    Major depression:         Hematologic    Bleeding problems:    Problems with blood clotting too easily:        Skin    Rashes or ulcers:        Constitutional    Fever or chills:      PHYSICAL EXAMINATION:  Vitals:   08/16/23 0715  BP: 136/85  Pulse: 94  Resp: 17  Temp: 98.3 F (36.8 C)  TempSrc: Oral  SpO2: 97%  Weight: 83 kg  Height: 5' 8 (1.727 m)    General:  WDWN in NAD; vital signs documented above Gait: Not observed HENT: WNL, normocephalic Pulmonary: normal non-labored breathing , without wheezing Cardiac: regular HR Abdomen: soft, NT, no masses Skin: without rashes Vascular Exam/Pulses:  Right Left  Radial 2+ (normal) 2+ (normal)  Ulnar    Femoral 1+ (weak) 2+ (normal)  Popliteal    DP absent 2+ (normal)  PT     Extremities: without ischemic changes, without Gangrene , without cellulitis; without open wounds;  Musculoskeletal: no muscle wasting or atrophy  Neurologic: A&O X 3;  No focal weakness or paresthesias are detected Psychiatric:  The pt has Normal affect.   Non-Invasive Vascular Imaging:     +----------+--------+-----+---------------+--------------+--------+  RIGHT    PSV cm/sRatioStenosis       Waveform      Comments  +----------+--------+-----+---------------+--------------+--------+  CIA Prox  385     4/1  75-99% stenosismonophasic              +----------+--------+-----+---------------+--------------+--------+  EIA Distal61                          monophasic              +----------+--------+-----+---------------+--------------+--------+  CFA Distal43                          monophasic              +----------+--------+-----+---------------+--------------+--------+  DFA      22                          monophasic               +----------+--------+-----+---------------+--------------+--------+  SFA Prox  41                          monophasic              +----------+--------+-----+---------------+--------------+--------+  SFA Mid   33                          monophasic              +----------+--------+-----+---------------+--------------+--------+  SFA Distal33                          monophasic              +----------+--------+-----+---------------+--------------+--------+  POP Prox  18                          monophasic              +----------+--------+-----+---------------+--------------+--------+  ATA Mid  not identified          +----------+--------+-----+---------------+--------------+--------+  PTA Mid   17                          monophasic              +----------+--------+-----+---------------+--------------+--------+  PERO Mid  17                          monophasic              +----------+--------+-----+---------------+--------------+--------+   A focal velocity elevation of was obtained at proximal common iliac  artery. Findings are characteristic of 75-99% stenosis.      +----------+--------+-----+--------+---------+--------+  LEFT     PSV cm/sRatioStenosisWaveform Comments  +----------+--------+-----+--------+---------+--------+  EIA Distal103                  triphasic          +----------+--------+-----+--------+---------+--------+  CFA Distal81                   triphasic          +----------+--------+-----+--------+---------+--------+  DFA      77                   triphasic          +----------+--------+-----+--------+---------+--------+  SFA Prox  65                   triphasic          +----------+--------+-----+--------+---------+--------+  SFA Mid   66                   triphasic          +----------+--------+-----+--------+---------+--------+  SFA  Distal48                   triphasic          +----------+--------+-----+--------+---------+--------+  POP Prox  40                   triphasic          +----------+--------+-----+--------+---------+--------+  ATA Mid   23                   triphasic          +----------+--------+-----+--------+---------+--------+  PTA Mid   30                   triphasic          +----------+--------+-----+--------+---------+--------+  PERO Mid  36                   triphasic          +----------+--------+-----+--------+---------+--------+     ABI Findings:  +---------+------------------+-----+-----------+--------+  Right   Rt Pressure (mmHg)IndexWaveform   Comment   +---------+------------------+-----+-----------+--------+  Brachial 178                                         +---------+------------------+-----+-----------+--------+  PTA     116               0.64 multiphasic          +---------+------------------+-----+-----------+--------+  DP      125  0.69 multiphasic          +---------+------------------+-----+-----------+--------+  Great Toe131               0.72                      +---------+------------------+-----+-----------+--------+   +---------+------------------+-----+--------+-------+  Left    Lt Pressure (mmHg)IndexWaveformComment  +---------+------------------+-----+--------+-------+  Brachial 181                                     +---------+------------------+-----+--------+-------+  PTA     177               0.98                  +---------+------------------+-----+--------+-------+  DP      179               0.99                  +---------+------------------+-----+--------+-------+  Great Toe154               0.85                  +---------+------------------+-----+--------+-------+   +-------+-----------+-----------+------------+------------+  ABI/TBIToday's  ABIToday's TBIPrevious ABIPrevious TBI  +-------+-----------+-----------+------------+------------+  Right .69        .72                                  +-------+-----------+-----------+------------+------------+  Left  .99        .85                                  +-------+-----------+-----------+------------+------------+       ASSESSMENT/PLAN: TALESHA ELLITHORPE is a 63 y.o. female presenting with waxing and waning right great toe pain with concern for arterial compromise.    The toe pain is inconsistent with arterial insufficiency as it waxes and wanes, and is present mainly when the patient bumps the toe.  She is convinced that the pain is similar to what she had 20 years ago when she needed the toenail removed.  On physical exam she had a nonpalpable pulse in the right foot, 1+ femoral pulse on the right. Imaging was reviewed demonstrating right-sided iliac stenosis on arterial duplex.  Upon review of ABI, I think that values are falsely elevated, especially at the right toe which demonstrates a toe pressure of 131, but very minimal phasicity.  I had a long discussion with Shahd regarding the above.  With her lifestyle choices such as daily smoking for decades, comorbidities which include longstanding diabetes, and nonpalpable pulse in the foot.  I would recommend diagnostic angiography with possible intervention in the right lower extremity prior to toenail removal should this be deemed necessary by her podiatrist.  Longstanding diabetes damages in the microcirculation, especially at the level of the feet and toes.  With a nonpalpable pulse in the foot, I am worried that she would have wound healing issues at the toenail removal site.  The other option would be continued conservative management.  Brandy Hall stated that the toenail needs to go, and that she will discuss this with her podiatrist.  I have discussed her care with my office.  We plan to refer her back  to her  podiatrist Dr.McCaughan.  Should they move forward with toenail removal, I asked her to call my office for angiography scheduling.  After discussing risk and benefits the above, Brandy Hall elected to proceed.  We discussed the importance of smoking cessation.  She is aware that if a stent were needed, smoking would increase risk of subsequent occlusion. Will need to hold Eliquis 2 days before.  Kayla Part, MD [facility] [phone] Total time of patient care including pre-visit research, consultation, and documentation greater than 45 minutes

## 2023-08-16 NOTE — ED Notes (Signed)
Got patient on the monitor patient is resting with family at bedside and call bell in reach  °

## 2023-08-16 NOTE — Progress Notes (Signed)
 Per MD Rosalva Comber, patient taken to ED for evaluation. No procedure done today.

## 2023-08-16 NOTE — Plan of Care (Signed)
  Problem: Health Behavior/Discharge Planning: Goal: Ability to identify and utilize available resources and services will improve Outcome: Progressing   Problem: Clinical Measurements: Goal: Ability to maintain clinical measurements within normal limits will improve Outcome: Progressing   Problem: Coping: Goal: Ability to adjust to condition or change in health will improve Outcome: Not Progressing   Problem: Health Behavior/Discharge Planning: Goal: Ability to manage health-related needs will improve Outcome: Not Progressing   Problem: Skin Integrity: Goal: Risk for impaired skin integrity will decrease Outcome: Completed/Met

## 2023-08-16 NOTE — Anesthesia Preprocedure Evaluation (Addendum)
 Anesthesia Evaluation  Patient identified by MRN, date of birth, ID band Patient awake    Reviewed: Allergy & Precautions, H&P , NPO status , Patient's Chart, lab work & pertinent test results  Airway Mallampati: III  TM Distance: >3 FB Neck ROM: Full    Dental no notable dental hx. (+) Teeth Intact, Dental Advisory Given   Pulmonary sleep apnea , COPD, Current Smoker and Patient abstained from smoking.   Pulmonary exam normal breath sounds clear to auscultation       Cardiovascular Exercise Tolerance: Good hypertension, Pt. on medications and Pt. on home beta blockers + CAD and + Past MI   Rhythm:Regular Rate:Normal     Neuro/Psych  Headaches  negative psych ROS   GI/Hepatic Neg liver ROS,GERD  Medicated,,  Endo/Other  diabetes, Insulin Dependent    Renal/GU negative Renal ROS  negative genitourinary   Musculoskeletal  (+) Arthritis , Osteoarthritis,    Abdominal   Peds  Hematology negative hematology ROS (+)   Anesthesia Other Findings   Reproductive/Obstetrics negative OB ROS                              Anesthesia Physical Anesthesia Plan  ASA: 3  Anesthesia Plan: MAC   Post-op Pain Management: Minimal or no pain anticipated   Induction: Intravenous  PONV Risk Score and Plan: 1 and Propofol infusion  Airway Management Planned: Natural Airway and Simple Face Mask  Additional Equipment:   Intra-op Plan:   Post-operative Plan:   Informed Consent: I have reviewed the patients History and Physical, chart, labs and discussed the procedure including the risks, benefits and alternatives for the proposed anesthesia with the patient or authorized representative who has indicated his/her understanding and acceptance.     Dental advisory given  Plan Discussed with: CRNA  Anesthesia Plan Comments:         Anesthesia Quick Evaluation

## 2023-08-16 NOTE — H&P (View-Only) (Signed)
 Consultation  Referring Provider: Dr. Aldean Amass    Primary Care Physician:  Ardella Beaver, MD Primary Gastroenterologist:  [facility] Reason for Consultation: Dysphagia              HPI:   Brandy Hall is a 63 y.o. female with a past medical history as listed below including COPD, diabetes, GERD and IBS, who presented to the ER today after being sent here from the vascular and vein specialist discussing some dysphagia symptoms.    This morning patient was seen by the vascular and vein specialist for some right great toe pain.  Apparently there was some concern the pain was due to peripheral arterial disease.  At that time they recommended diagnostic angiography with possible intervention in the right lower extremity prior to toenail removal.  There was a nonpalpable pulse in the foot.  She was scheduled for angiography with possible stent.  Prior to the procedure though patient started complaining of 6 out of 10 chest pain for the past 3 days.  EKG not concerning evaluated by cardiology.  On further eval she states she was unable to swallow over the last 3 days, only drinking soup, apparently has history of esophageal dilation in [place].  Unable to take her medications due to the inability to swallow.  It was discussed that because she would need dual antiplatelet medication after the procedure they needed to come to the emergency department for possible intervention prior to proceeding with vascular intervention.    Today, patient seen with her daughter by her bedside.  She explains that she went to the vascular surgeon today to help her foot, but instead ended up here in the ER.  At the time of that appointment tells me that she told them 3 days ago acutely she woke up on Sunday tried to eat and it came right back up.  She then took some pills but they just sat in her throat for hours, so she stopped eating or drinking anything solid.  She has just been getting soups down and liquids.   She does tell me that it hurts a little bit in her mid chest over her sternal area.  Also had diarrhea it sounds like on Friday night 08/11/2023 that lasted all night long and then half of the day Saturday and now has just been coming on occasionally.  Apparently over the past couple of days has decreased a little bit.      Has also noticed that her urine is cloudy with some discomfort and thinks she may have a UTI.  Patient does tell me that she uses inhalers for her COPD and never rinses or washes.      She does recall a history of esophageal dilations the last 4 years ago in [place].  Tells me that symptoms do not usually come on this quickly, it is typically more gradual.    Last dose of Plavix was 08/15/2023 AM    Denies fever, chills or weight loss.  GI history: [facility]/15/[facility]-cannot see actual report 07/29/2020 blood thinner clearance from [facility]  Past Medical History:  Diagnosis Date   Allergic rhinitis    COPD (chronic obstructive pulmonary disease) (HCC)    Diabetes (HCC)    GERD (gastroesophageal reflux disease)    History of colon polyps    Hypercholesteremia    Hypertension    IBS (irritable bowel syndrome)    Seasonal allergies    Trigeminal neuralgia  Past Surgical History:  Procedure Laterality Date   ABDOMINAL HYSTERECTOMY     BREAST CYST ASPIRATION Right    COLONOSCOPY  09/15/2014   [facility]. Multiple small polyps in the transverse and sigmoid colon, not removed today as the patient is on clopidogrel   ELBOW SURGERY     bilat   ESOPHAGOGASTRODUODENOSCOPY  02/06/2014   Small hiatal hernia. Mild gastritis. Gastric polyp status post polypectomy.   GALLBLADDER SURGERY     KNEE SURGERY     left   LAPAROSCOPY  1999   showed endometriosis   OOPHORECTOMY  11/15/2013   Dr Aneta Bar- and salpingectomy   ROTATOR CUFF REPAIR     left    History reviewed. No pertinent family history.   Social History   Tobacco Use   Smoking  status: Every Day    Current packs/day: 0.50    Average packs/day: 0.5 packs/day for 25.0 years (12.5 ttl pk-yrs)    Types: Cigarettes   Smokeless tobacco: Never  Vaping Use   Vaping status: Never Used  Substance Use Topics   Alcohol use: No   Drug use: Never    Prior to Admission medications   Medication Sig Start Date End Date Taking? Authorizing Provider  albuterol  (PROVENTIL  HFA;VENTOLIN  HFA) 108 (90 BASE) MCG/ACT inhaler Inhale 2 puffs into the lungs every 6 (six) hours as needed for wheezing or shortness of breath.    [provider]  amLODipine (NORVASC) 5 MG tablet Take 5 mg by mouth. Patient not taking: Reported on 07/27/2023    [provider]  ASPIRIN 81 PO 81 mg once a day    [provider]  cholecalciferol (VITAMIN D) 1000 UNITS tablet Take 1,000 Units by mouth daily.    [provider]  clopidogrel (PLAVIX) 75 MG tablet Take 75 mg by mouth.    [provider]  famotidine (PEPCID) 20 MG tablet 20 mg per 1 tablet, ORAL, BID (2 times a day), 0 Refill(s) 08/09/19   [provider]  insulin aspart (NOVOLOG) 100 UNIT/ML injection  02/27/23   [provider]  isosorbide mononitrate (IMDUR) 30 MG 24 hr tablet Take 30 mg by mouth 2 (two) times daily.     [provider]  LANTUS SOLOSTAR 100 UNIT/ML Solostar Pen 24 units sq once a day with meals 07/21/15   [provider]  lisinopril-hydrochlorothiazide (PRINZIDE,ZESTORETIC) 20-25 MG per tablet Take 1 tablet daily by mouth 10/21/13   [provider]  metoprolol tartrate (LOPRESSOR) 25 MG tablet Take 25 mg by mouth 2 (two) times daily.    [provider]  mometasone -formoterol  (DULERA) 200-5 MCG/ACT AERO Inhale 2 puffs into the lungs 2 (two) times daily. 09/30/14   Vernell Goldsmith, MD  Multiple Vitamin (MULTIVITAMIN) capsule Take 1 capsule by mouth daily.    [provider]  nitroGLYCERIN (NITROSTAT) 0.4 MG SL tablet Place 0.4 mg  under the tongue.    [provider]  ondansetron (ZOFRAN) 4 MG tablet TAKE 1 TABLET(S) BY MOUTH 3 TIMES/DAY AS NEEDED FOR NAUSEA AND VOMITING 09/19/14   [provider]  ranolazine (RANEXA) 1000 MG SR tablet Take 500 mg by mouth.    [provider]  SitaGLIPtin-MetFORMIN HCl (JANUMET XR) 50-1000 MG TB24 Take 1 tablet by mouth twice a day with meals 10/25/13   [provider]    No current facility-administered medications for this encounter.   Current Outpatient Medications  Medication Sig Dispense Refill   albuterol  (PROVENTIL  HFA;VENTOLIN  HFA) 108 (  90 BASE) MCG/ACT inhaler Inhale 2 puffs into the lungs every 6 (six) hours as needed for wheezing or shortness of breath.     amLODipine (NORVASC) 5 MG tablet Take 5 mg by mouth. (Patient not taking: Reported on 07/27/2023)     ASPIRIN 81 PO 81 mg once a day     cholecalciferol (VITAMIN D) 1000 UNITS tablet Take 1,000 Units by mouth daily.     clopidogrel (PLAVIX) 75 MG tablet Take 75 mg by mouth.     famotidine (PEPCID) 20 MG tablet 20 mg per 1 tablet, ORAL, BID (2 times a day), 0 Refill(s)     insulin aspart (NOVOLOG) 100 UNIT/ML injection      isosorbide mononitrate (IMDUR) 30 MG 24 hr tablet Take 30 mg by mouth 2 (two) times daily.      LANTUS SOLOSTAR 100 UNIT/ML Solostar Pen 24 units sq once a day with meals     lisinopril-hydrochlorothiazide (PRINZIDE,ZESTORETIC) 20-25 MG per tablet Take 1 tablet daily by mouth     metoprolol tartrate (LOPRESSOR) 25 MG tablet Take 25 mg by mouth 2 (two) times daily.     mometasone -formoterol  (DULERA) 200-5 MCG/ACT AERO Inhale 2 puffs into the lungs 2 (two) times daily. 1 Inhaler 6   Multiple Vitamin (MULTIVITAMIN) capsule Take 1 capsule by mouth daily.     nitroGLYCERIN (NITROSTAT) 0.4 MG SL tablet Place 0.4 mg under the tongue.     ondansetron (ZOFRAN) 4 MG tablet TAKE 1 TABLET(S) BY MOUTH 3 TIMES/DAY AS NEEDED FOR NAUSEA AND VOMITING  0   ranolazine (RANEXA) 1000 MG SR  tablet Take 500 mg by mouth.     SitaGLIPtin-MetFORMIN HCl (JANUMET XR) 50-1000 MG TB24 Take 1 tablet by mouth twice a day with meals      Allergies as of 08/16/2023 - Review Complete 08/16/2023  Allergen Reaction Noted   Rosuvastatin  09/01/2020   Dulaglutide Nausea Only, Nausea And Vomiting, and Other (See Comments) 06/27/2019   Oxycodone-acetaminophen Hives 11/12/2013   Oxycodone-acetaminophen Other (See Comments) 06/04/2023   Statins Other (See Comments) 06/02/2023     Review of Systems:    Constitutional: No weight loss, fever or chills Skin: No rash  Cardiovascular: No chest pain Respiratory: No SOB  Gastrointestinal: See HPI and otherwise negative Genitourinary: No dysuria  Neurological: No headache, dizziness or syncope Musculoskeletal: No new muscle or joint pain Hematologic: No bleeding  Psychiatric: No history of depression or anxiety    Physical Exam:  Vital signs in last 24 hours: Temp:  [97.9 F (36.6 C)-98.3 F (36.8 C)] 97.9 F (36.6 C) (06/11 0840) Pulse Rate:  [0-96] 95 (06/11 0840) Resp:  [17-22] 20 (06/11 0840) BP: (133-136)/(85-99) 133/99 (06/11 0840) SpO2:  [97 %-100 %] 100 % (06/11 0840) Weight:  [83 kg-87.5 kg] 87.5 kg (06/11 0841)   General:   Pleasant AA female appears to be in NAD, Well developed, Well nourished, alert and cooperative Head:  Normocephalic and atraumatic. Eyes:   PEERL, EOMI. No icterus. Conjunctiva pink. Ears:  Normal auditory acuity. Neck:  Supple Throat: Oral cavity and pharynx without inflammation, swelling or lesion. Teeth in good condition. Lungs: Respirations even and unlabored. Lungs clear to auscultation bilaterally.   +chronic cough Heart: Normal S1, S2. No MRG. Regular rate and rhythm. No peripheral edema, cyanosis or pallor.  Abdomen:  Soft, nondistended, mild epigastric/right upper quadrant TTP, no rebound or guarding. Normal bowel sounds. No appreciable masses or hepatomegaly. Rectal:  Not performed.  Msk:   Symmetrical without gross  deformities. Peripheral pulses intact.  Extremities:  Without edema, no deformity or joint abnormality. Normal ROM, normal sensation. Neurologic:  Alert and  oriented x4;  grossly normal neurologically.  Skin:   Dry and intact without significant lesions or rashes. Psychiatric: Demonstrates good judgement and reason without abnormal affect or behaviors.  LAB RESULTS: Recent Labs    08/16/23 0747 08/16/23 0952  WBC  --  5.0  HGB 13.9 13.2  HCT 41.0 39.0  PLT  --  156   BMET Recent Labs    08/16/23 0747  NA 134*  K 3.3*  CL 96*  GLUCOSE 222*  BUN 5*  CREATININE 0.80     PREVIOUS ENDOSCOPIES:            See HPI   Impression / Plan:   Impression: 1.  Dysphagia: Acute onset over the past 3 days, also some diarrhea and cloudy urine, has been able to tolerate soups and liquids, prior history of esophageal strictures last 4 years ago, but those had gradual onset; concern for viral or bacterial esophagitis +/- candidiasis given inhaler usage versus stricture 2.  Diarrhea: For about 48 hours, now occurs here and there per patient; possibly part of a viral etiology for symptoms 3.  Esophageal pain: See above 4.  Right great toe pain: With concern for arterial compromise, originally came into the hospital today for intervention, but did not have this done due to dysphagia and inability to take dual antiplatelet therapy after  Plan: 1.  Continue to hold Plavix, last dose 08/15/2023 AM 2.  Patient can tolerate liquids.  Per Dr. Glorianne Proctor we will proceed with diagnostic EGD tomorrow.  May not be able to dilate on a blood thinner, but can ensure there is not a superimposed infection.  Did discuss risk of benefits, notations and alternatives and patient agrees to proceed. 3.  Continue other forted measures. 4. Ok for clear liquid diet at this point, will make n.p.o. after midnight  Thank you for your kind consultation, we will continue to follow.  Kathy Parker  [facility]  08/16/2023, 10:23 AM   Attending physician's note  I personally saw the patient and performed a substantive portion of this encounter (>50% time spent), including a complete performance of at least one of the key components (MDM, Hx and/or Exam), in conjunction with the APP.  I agree with the APP's note, impression, and recommendations with additional input as follows.     63 year old female with history of peripheral vascular disease on aspirin and Plavix with complaints of acute onset pill dysphagia in the past 3 days, she is only able to tolerate liquid diet Will plan to proceed with EGD tomorrow morning to evaluate, exclude erosive esophagitis, peptic stricture or esophageal candidiasis Continue clear diet, n.p.o. after midnight PPI IV twice daily The risks and benefits as well as alternatives of endoscopic procedure(s) have been discussed and reviewed. All questions answered. The patient agrees to proceed.   The patient was provided an opportunity to ask questions and all were answered. The patient agreed with the plan and demonstrated an understanding of the instructions.  Lorena Rolling , MD [phone]

## 2023-08-16 NOTE — ED Triage Notes (Signed)
 Pt states she was getting a procedure done today and mentioned to doctor that it took her 3 hours for pills to go down yesterday. MD recommenced pt go to ER. Pt sates hx of same. Pt able to talk and swallow in triage.

## 2023-08-16 NOTE — Hospital Course (Addendum)
  Brandy Hall,   You came to the hospital for food getting stuck.  Gastroenterology saw you, and they dilated your esophagus.  These are the changes to medications:   -Please start Plavix on 08/20/2023, Sunday. -Please start Protonix 40 mg, take 1 tablet by mouth 2 times daily starting today  For your UTI -Please take Ceftin 500 mg, take 1 tablet tonight, starting tomorrow you can take 1 tablet by mouth 2 times daily until you are finished with your medications. -Otherwise you can continue the rest of your medications as prescribed  Please make sure you schedule an appointment with your primary care doctor.  Please make sure you schedule an appointment with your gastroenterology doctor if you are still having symptoms of food getting stuck when you swallow so they can do additional tests to find the source.  Your vascular surgery will reach out to you to schedule the procedure next week.  If you have any of these following symptoms, please call us  or seek care at an emergency department: -Chest Pain -Difficulty Breathing -Worsening abdominal pain -Syncope (passing out) -Drooping of face -Slurred speech -Sudden weakness in your leg or arm -Fever -Chills -blood in the stool -dark black, sticky stool  If you have any questions or concerns, call our clinic at [phone], [phone] or after hours call [phone] and ask for the internal medicine resident on call.  I am glad you are feeling better. It was a pleasure taking care for you. I wish a good recovery and good health!   Dr. Lanney Pitts

## 2023-08-16 NOTE — Progress Notes (Signed)
 HD#1 Subjective:   Summary: Brandy Hall is a 63 y.o. female with PMH of hypertension, hyperlipidemia, diabetes, COPD, palpitations, sleep apnea, IBS, GERD, history of RCA stent in 2016 and cath in January 2020, PAD who presented to the ER today after being sent from the vascular vein specially with concerns about acute dysphagia.   Overnight Events: None   Interim history: Patient is evaluated at bedside.  Laying in bed, no acute distress.  Denies any chest pain, shortness of breath.  Denies any nausea or vomiting. No abdominal pain. Reports the GI procedure went well this AM, her esophagus was dilated.   Objective:  Vital signs in last 24 hours: Vitals:   08/17/23 0845 08/17/23 0900 08/17/23 0902 08/17/23 0926  BP: (!) 151/85 (!) 149/93  (!) 155/80  Pulse: 97 (!) 116 87 90  Resp: 13 17  18   Temp: 97.7 F (36.5 C)  97.7 F (36.5 C) (!) 97.5 F (36.4 C)  TempSrc:    Oral  SpO2: 97% 93%  100%  Weight:      Height:       Supplemental O2: Room Air SpO2: 100 %   Physical Exam:   Constitutional: Laying in bed, no acute distress HENT: normocephalic atraumatic, mucous membranes moist Eyes: conjunctiva non-erythematous Cardiovascular: regular rate and rhythm Pulmonary/Chest: normal work of breathing on room air, lungs clear to auscultation bilaterally Abdominal: soft, non-tender, non-distended MSK: normal bulk and tone Neurological: Awake, alert, answering questions appropriately, no focal deficits Skin: warm and dry Psych: Pleasant   Filed Weights   08/16/23 0841  Weight: 87.5 kg     Intake/Output Summary (Last 24 hours) at 08/17/2023 1124 Last data filed at 08/17/2023 1106 Gross per 24 hour  Intake 620 ml  Output 250 ml  Net 370 ml   Net IO Since Admission: 370 mL [08/17/23 1124]  Pertinent Labs:    Latest Ref Rng & Units 08/16/2023    9:52 AM 08/16/2023    7:47 AM  CBC  WBC 4.0 - 10.5 K/uL 5.0    Hemoglobin 12.0 - 15.0 g/dL 65.7  84.6   Hematocrit  36.0 - 46.0 % 39.0  41.0   Platelets 150 - 400 K/uL 156         Latest Ref Rng & Units 08/17/2023    7:01 AM 08/16/2023    9:52 AM 08/16/2023    7:47 AM  CMP  Glucose 70 - 99 mg/dL 962  952  841   BUN 8 - 23 mg/dL 7  7  5    Creatinine 0.44 - 1.00 mg/dL 3.24  4.01  0.27   Sodium 135 - 145 mmol/L 134  133  134   Potassium 3.5 - 5.1 mmol/L 3.9  3.5  3.3   Chloride 98 - 111 mmol/L 103  99  96   CO2 22 - 32 mmol/L 18  21    Calcium 8.9 - 10.3 mg/dL 9.1  8.9    Total Protein 6.5 - 8.1 g/dL  6.4    Total Bilirubin 0.0 - 1.2 mg/dL  1.1    Alkaline Phos 38 - 126 U/L  78    AST 15 - 41 U/L  15    ALT 0 - 44 U/L  10      Imaging: No results found.   Assessment/Plan:   Principal Problem:   Dysphagia Active Problems:   DM (diabetes mellitus) (HCC)   Tobacco use disorder   COPD (chronic obstructive pulmonary disease) (HCC)  Coronary artery disease involving native coronary artery of native heart with angina pectoris [facility])   Sleep apnea   Patient Summary: Brandy Hall is a 63 y.o. with a pertinent PMH of hypertension, hyperlipidemia, diabetes, COPD, palpitations, sleep apnea, IBS, GERD, history of RCA stent in 2016 and cath in January 2020, PAD who presented to the ER today after being sent from the vascular vein specially with concerns about acute dysphagia.   Acute Esophageal Dysphagia  History of GERD Presented with 4 days of dysphagia like symptoms with food getting stuck and regurgitation.  GI on board, EGD this morning with dilation of the lower third of the esophagus, no abnormality noted.  Normal stomach and duodenum.  GI signed off this morning.  On exam, patient denies any abdominal pain, nausea, vomiting. - Appreciate GI recommendations  - Diet: Dysphagia type III diet  - Meds: Resume Plavix in 3 days, Protonix 40 mg twice daily  - Follow up: Plan for outpatient follow-up with GI if has persistent dysphagia with consideration of modified barium esophagram to exclude  oropharyngeal dysphagia  UTI  Presented with symptoms of increased urine frequency, UA positive for nitrates, large leukocytes and many bacteria.  Patient was started on Macrobid 6/11, received one dose.  - Change Macrobid to Ceftin 500 mg BID For 9 doses starting this AM  PAD  Follows [facility], was planned for diagnostic abdominal aortogram RLE 6/11, but procedure was not performed due to acute dysphagia symptoms. Does report history of recurrent falls, with last fall last week. Stated that she fell backwards on her head. No dizzy spells or lightheadedness. Reports that it is d/t bilateral LE weakness, uses walker as mobility device.  - Will reach out Dr Rosalva Comber to see whether abdominal aortogram can be done here during inpatient or would like outpatient - Restart Plavix 06/15 - Continue Aspirin 81 mg daily as crushed  - PT/OT eval and treat     T2DM  Per chart review, A1c 8.4 noted on 05/12/2023; outpatient medication regimen includes Novolog SSI. Reports that she is not taking Lantus 24 units and has not checked CBG at home. Appears to be noncompliant with insulin.  - CBG 164 - SSI moderate    HTN Outpatient medication regimen includes amlodipine 5 mg daily, lisinopril 10 mg daily, metoprolol 25 mg twice daily, Imdur 30 mg twice daily.  Tolerating medications with Ensure. - Per pharmacy, not recommended to cut tablets in half, to give as crushed except for IMDUR - NOW dysphagia type III diet, will do a trial of whole tablets to see if patient can swallow  - Continue home medications amlodipine 5 mg daily, lisinopril 10 mg daily, metoprolol 25 mg twice daily a - Continue Imdur 30 mg twice daily  CAD Hyperlipidemia Outpatient medication regimen includes Zetia 10 mg daily and Ranexa BID. - Continue Zetia - Hold Ranexa for now     Tobacco Use Smokes 6 cigarettes daily, smoking cessation counseling is provided.    Prolonged QTc EKG showed QTc 505, awake and disabling medication.  No chest  pain or shortness of breath.   History of COPD Outpatient medication regimen includes Trelegy inhaler daily, albuterol , and Singulair 10 mg daily   On exam, no shortness of breath or wheezing noted. - Continue Trelegy daily  - Continue Albuterol  PRN    Diet: Dysphagia type III diet IVF: None,None VTE: Heparin Wounds: Assessed by RN  Code: Full PT/OT recs: Pending  TOC recs: None   Dispo: Anticipated discharge  to Home in 1 days pending medical management.   Signature: Lanney Pitts, D.O.  Internal Medicine Resident, PGY-1 [facility]  11:24 AM, 08/17/2023   Please contact the on call pager after 5 pm and on weekends at [phone].

## 2023-08-17 ENCOUNTER — Inpatient Hospital Stay (HOSPITAL_COMMUNITY)

## 2023-08-17 ENCOUNTER — Other Ambulatory Visit: Payer: Self-pay

## 2023-08-17 ENCOUNTER — Encounter (HOSPITAL_COMMUNITY): Payer: Self-pay | Admitting: *Deleted

## 2023-08-17 ENCOUNTER — Other Ambulatory Visit (HOSPITAL_COMMUNITY): Payer: Self-pay

## 2023-08-17 ENCOUNTER — Encounter (HOSPITAL_COMMUNITY)
Admission: EM | Disposition: A | Discharge status: Home / Self Care | Payer: Self-pay | Source: Home / Self Care | Attending: Emergency Medicine

## 2023-08-17 ENCOUNTER — Encounter (HOSPITAL_COMMUNITY): Payer: Self-pay | Admitting: Vascular Surgery

## 2023-08-17 DIAGNOSIS — I10 Essential (primary) hypertension: Secondary | ICD-10-CM

## 2023-08-17 DIAGNOSIS — F17210 Nicotine dependence, cigarettes, uncomplicated: Secondary | ICD-10-CM | POA: Diagnosis not present

## 2023-08-17 DIAGNOSIS — I739 Peripheral vascular disease, unspecified: Secondary | ICD-10-CM

## 2023-08-17 DIAGNOSIS — R1310 Dysphagia, unspecified: Secondary | ICD-10-CM | POA: Diagnosis not present

## 2023-08-17 DIAGNOSIS — I2510 Atherosclerotic heart disease of native coronary artery without angina pectoris: Secondary | ICD-10-CM

## 2023-08-17 HISTORY — PX: ESOPHAGOGASTRODUODENOSCOPY: SHX5428

## 2023-08-17 HISTORY — PX: ESOPHAGEAL DILATION: SHX303

## 2023-08-17 LAB — GLUCOSE, CAPILLARY
Glucose-Capillary: 147 mg/dL — ABNORMAL HIGH (ref 70–99)
Glucose-Capillary: 153 mg/dL — ABNORMAL HIGH (ref 70–99)
Glucose-Capillary: 156 mg/dL — ABNORMAL HIGH (ref 70–99)
Glucose-Capillary: 164 mg/dL — ABNORMAL HIGH (ref 70–99)
Glucose-Capillary: 194 mg/dL — ABNORMAL HIGH (ref 70–99)

## 2023-08-17 LAB — HIV ANTIBODY (ROUTINE TESTING W REFLEX): HIV Screen 4th Generation wRfx: NONREACTIVE

## 2023-08-17 LAB — BASIC METABOLIC PANEL WITH GFR
Anion gap: 13 (ref 5–15)
BUN: 7 mg/dL — ABNORMAL LOW (ref 8–23)
CO2: 18 mmol/L — ABNORMAL LOW (ref 22–32)
Calcium: 9.1 mg/dL (ref 8.9–10.3)
Chloride: 103 mmol/L (ref 98–111)
Creatinine, Ser: 0.85 mg/dL (ref 0.44–1.00)
GFR, Estimated: >60 mL/min (ref >60)
Glucose, Bld: 147 mg/dL — ABNORMAL HIGH (ref 70–99)
Potassium: 3.9 mmol/L (ref 3.5–5.1)
Sodium: 134 mmol/L — ABNORMAL LOW (ref 135–145)

## 2023-08-17 SURGERY — EGD (ESOPHAGOGASTRODUODENOSCOPY)
Anesthesia: Monitor Anesthesia Care

## 2023-08-17 MED ORDER — CEFUROXIME AXETIL 500 MG PO TABS
500.0000 mg | ORAL_TABLET | Freq: Two times a day (BID) | ORAL | 0 refills | Status: AC
  Filled 2023-08-17: qty 9, 5d supply, fill #0

## 2023-08-17 MED ORDER — GLYCOPYRROLATE 0.2 MG/ML IJ SOLN
INTRAMUSCULAR | Status: DC | PRN
Start: 2023-08-17 — End: 2023-08-17
  Administered 2023-08-17: .1 mg via INTRAVENOUS

## 2023-08-17 MED ORDER — PANTOPRAZOLE SODIUM 40 MG PO TBEC: 40.0000 mg | DELAYED_RELEASE_TABLET | Freq: Two times a day (BID) | ORAL | Status: DC

## 2023-08-17 MED ORDER — ALBUTEROL SULFATE HFA 108 (90 BASE) MCG/ACT IN AERS
INHALATION_SPRAY | RESPIRATORY_TRACT | Status: DC | PRN
  Administered 2023-08-17: 2 via RESPIRATORY_TRACT

## 2023-08-17 MED ORDER — ESMOLOL HCL 100 MG/10ML IV SOLN
INTRAVENOUS | Status: DC | PRN
  Administered 2023-08-17: 30 mg via INTRAVENOUS
  Administered 2023-08-17: 20 mg via INTRAVENOUS

## 2023-08-17 MED ORDER — CEFUROXIME AXETIL 250 MG PO TABS: 500.0000 mg | ORAL_TABLET | Freq: Two times a day (BID) | ORAL | Status: DC

## 2023-08-17 MED ORDER — VASOPRESSIN 20 UNIT/ML IV SOLN
INTRAVENOUS | Status: DC | PRN
Start: 2023-08-17 — End: 2023-08-17
  Administered 2023-08-17: 1 [IU] via INTRAVENOUS

## 2023-08-17 MED ORDER — PANTOPRAZOLE SODIUM 40 MG PO TBEC
40.0000 mg | DELAYED_RELEASE_TABLET | Freq: Two times a day (BID) | ORAL | 0 refills | Status: DC
  Filled 2023-08-17: qty 60, 30d supply, fill #0

## 2023-08-17 MED ORDER — PHENYLEPHRINE 80 MCG/ML (10ML) SYRINGE FOR IV PUSH (FOR BLOOD PRESSURE SUPPORT)
PREFILLED_SYRINGE | INTRAVENOUS | Status: DC | PRN
  Administered 2023-08-17: 240 ug via INTRAVENOUS
  Administered 2023-08-17: 80 ug via INTRAVENOUS
  Administered 2023-08-17: 120 ug via INTRAVENOUS
  Administered 2023-08-17: 80 ug via INTRAVENOUS
  Administered 2023-08-17: 120 ug via INTRAVENOUS

## 2023-08-17 MED ORDER — SODIUM CHLORIDE 0.9 % IV SOLN: INTRAVENOUS | Status: DC | PRN

## 2023-08-17 MED ORDER — CEFUROXIME AXETIL 250 MG PO TABS
500.0000 mg | ORAL_TABLET | Freq: Two times a day (BID) | ORAL | Status: DC
  Administered 2023-08-17: 500 mg via ORAL
  Filled 2023-08-17 (×2): qty 2

## 2023-08-17 MED ORDER — PROPOFOL 10 MG/ML IV BOLUS
INTRAVENOUS | Status: DC | PRN
  Administered 2023-08-17: 20 mg via INTRAVENOUS
  Administered 2023-08-17: 30 mg via INTRAVENOUS
  Administered 2023-08-17 (×2): 20 mg via INTRAVENOUS
  Administered 2023-08-17: 10 mg via INTRAVENOUS

## 2023-08-17 MED ORDER — PROPOFOL 500 MG/50ML IV EMUL
INTRAVENOUS | Status: DC | PRN
  Administered 2023-08-17: 80 ug/kg/min via INTRAVENOUS

## 2023-08-17 NOTE — Plan of Care (Signed)
 Pt ready for DC to home, will be picked up by family member. Vascular surg will be done as an outpt, pt informed. Pt was able to swallow food and pills successfully this afternoon.

## 2023-08-17 NOTE — Anesthesia Postprocedure Evaluation (Signed)
 Anesthesia Post Note  Patient: Brandy Hall  Procedure(s) Performed: EGD (ESOPHAGOGASTRODUODENOSCOPY) DILATION, ESOPHAGUS     Patient location during evaluation: PACU Anesthesia Type: MAC Level of consciousness: awake and alert Pain management: pain level controlled Vital Signs Assessment: post-procedure vital signs reviewed and stable Respiratory status: spontaneous breathing, nonlabored ventilation and respiratory function stable Cardiovascular status: stable and blood pressure returned to baseline Postop Assessment: no apparent nausea or vomiting Anesthetic complications: no  No notable events documented.  Last Vitals:  Vitals:   08/17/23 0902 08/17/23 0926  BP:  (!) 155/80  Pulse: 87 90  Resp:  18  Temp: 36.5 C (!) 36.4 C  SpO2:  100%    Last Pain:  Vitals:   08/17/23 0926  TempSrc: Oral  PainSc:                  Elia Keenum,W. EDMOND

## 2023-08-17 NOTE — Progress Notes (Signed)
 Pt received back from ENDO .

## 2023-08-17 NOTE — Progress Notes (Signed)
 Pt left unit to endoscopy via wheelchair and RN

## 2023-08-17 NOTE — Op Note (Addendum)
 [facility] Patient Name: Brandy Hall Procedure Date : 08/17/2023 MRN: [id number] Attending MD: Sergio Dandy , MD, [id number] Date of Birth: Jul 13, 1960 CSN: [id number] Age: 63 Admit Type: Inpatient Procedure:                Upper GI endoscopy Indications:              Dysphagia Providers:                Sergio Dandy, MD, Nadean August, RN,                            Arlin Benes, Technician, Bethanie Brooking, CRNA Referring MD:              Medicines:                Monitored Anesthesia Care Complications:            No immediate complications. Estimated Blood Loss:     Estimated blood loss was minimal. Procedure:                Pre-Anesthesia Assessment:                           - Prior to the procedure, a History and Physical                            was performed, and patient medications and                            allergies were reviewed. The patient's tolerance of                            previous anesthesia was also reviewed. The risks                            and benefits of the procedure and the sedation                            options and risks were discussed with the patient.                            All questions were answered, and informed consent                            was obtained. Prior Anticoagulants: The patient has                            taken no anticoagulant or antiplatelet agents. ASA                            Grade Assessment: III - A patient with severe                            systemic disease. After reviewing the risks and  benefits, the patient was deemed in satisfactory                            condition to undergo the procedure.                           After obtaining informed consent, the endoscope was                            passed under direct vision. Throughout the                            procedure, the patient's blood pressure, pulse, and                             oxygen saturations were monitored continuously. The                            [id number]-H190 ([id number]) Olympus endoscope was introduced                            through the mouth, and advanced to the second part                            of duodenum. The upper GI endoscopy was                            accomplished without difficulty. The patient                            tolerated the procedure well. Scope In: Scope Out: Findings:      The Z-line was regular and was found 38 cm from the incisors.      No endoscopic abnormality was evident in the esophagus to explain the       patient's complaint of dysphagia. It was decided, however, to proceed       with dilation of the lower third of the esophagus. A TTS dilator was       passed through the scope. Dilation with an 18-19-20 mm x 8 cm CRE       balloon dilator was performed to 20 mm. The dilation site was examined       following endoscope reinsertion and showed mild mucosal disruption.      The stomach was normal.      The cardia and gastric fundus were normal on retroflexion.      The examined duodenum was normal. Impression:               - Z-line regular, 38 cm from the incisors.                           - No endoscopic esophageal abnormality to explain                            patient's dysphagia. Esophagus dilated. Dilated.                           -  Normal stomach.                           - Normal examined duodenum.                           - No specimens collected. Recommendation:           - soft diet as tolerated.                           - Continue present medications.                           - Follow an antireflux regimen.                           - Resume Plavix (clopidogrel) at prior dose in 3                            days. Refer to managing physician for further                            adjustment of therapy.                           - Use Protonix (pantoprazole) 40 mg PO BID.                            - GI signing off, please call with any questions                           - Follow up with outpatient GI if has persistent                            dysphagia, consider modified barium esophagogram to                            exclude oropharyngeal dysphagia, already                            established with GI group in [facility] Procedure Code(s):        --- Professional ---                           [phone], Esophagogastroduodenoscopy, flexible,                            transoral; with transendoscopic balloon dilation of                            esophagus (less than 30 mm diameter) Diagnosis Code(s):        --- Professional ---                           R13.10, Dysphagia, unspecified CPT copyright 2022 American [employer] Association. All rights reserved. The codes  documented in this report are preliminary and upon coder review may  be revised to meet current compliance requirements. Sharnice Bosler V. Bolton Canupp, MD 08/17/2023 8:52:59 AM This report has been signed electronically. Number of Addenda: 0

## 2023-08-17 NOTE — Addendum Note (Signed)
 Addendum  created 08/17/23 1002 by Pasty Bongo, CRNA   Flowsheet accepted, Intraprocedure Event edited, Intraprocedure Flowsheets edited, Intraprocedure Meds edited

## 2023-08-17 NOTE — Progress Notes (Signed)
   08/17/23 1202  TOC Brief Assessment  Insurance and Status Reviewed ([employer] PPO)  Patient has primary care physician Yes (Sistasis, Rowena, MD)  Home environment has been reviewed From Home  Prior level of function: Uses Walker  mostly dependent for  ADL's, IADL's (Daughter)  Prior/Current Home Services No current home services (Has had [facility] in the past)  Social Drivers of Health Review SDOH reviewed interventions complete (Smoking cessation information attached to DC instructions- Note states she smokes 6 cigarettes per day)  Readmission risk has been reviewed Yes (10%)  Transition of care needs no transition of care needs at this time   Code 44 given this am. TOC will continue to follow patient for any discharge needs

## 2023-08-17 NOTE — Evaluation (Signed)
 Physical Therapy Evaluation and Discharge Patient Details Name: Brandy Hall MRN: [id number] DOB: 03/06/1961 Today's Date: 08/17/2023  History of Present Illness  Patient is a 63 year old female with acute dysphasia. Right toe pain with PAD.  Clinical Impression  Patient is agreeable to PT evaluation. She is independent at baseline and lives alone. She uses a walker intermittently for mobility.  Today the patient appears to be at her baseline level of functional independence. Slow but steady with ambulation without assistive device. No apparent acute PT needs at this time. PT will sign off.       If plan is discharge home, recommend the following: Assist for transportation   Can travel by private vehicle        Equipment Recommendations None recommended by PT  Recommendations for Other Services       Functional Status Assessment Patient has not had a recent decline in their functional status     Precautions / Restrictions Precautions Precautions: None Restrictions Weight Bearing Restrictions Per Provider Order: No      Mobility  Bed Mobility Overal bed mobility: Independent                  Transfers Overall transfer level: Modified independent                      Ambulation/Gait Ambulation/Gait assistance: Modified independent (Device/Increase time) Gait Distance (Feet): 40 Feet Assistive device: None Gait Pattern/deviations: Step-through pattern Gait velocity: decreased     General Gait Details: slow but steady with no loss of balance. occasional decreased stance time on RLE. stable without device and no shortness of breath reported with mobility  Stairs            Wheelchair Mobility     Tilt Bed    Modified Rankin (Stroke Patients Only)       Balance Overall balance assessment: No apparent balance deficits (not formally assessed)                                           Pertinent Vitals/Pain Pain  Assessment Pain Assessment: No/denies pain    Home Living Family/patient expects to be discharged to:: Private residence Living Arrangements: Alone   Type of Home: House Home Access: Level entry         Home Equipment: Agricultural consultant (2 wheels)      Prior Function Prior Level of Function : Independent/Modified Independent             Mobility Comments: only intermittent use of rolling walker       Extremity/Trunk Assessment   Upper Extremity Assessment Upper Extremity Assessment: Overall WFL for tasks assessed    Lower Extremity Assessment Lower Extremity Assessment: RLE deficits/detail;LLE deficits/detail RLE Deficits / Details: no focal weakness noted. WFL for functional activity RLE Sensation: history of peripheral neuropathy LLE Deficits / Details: no focal weakness noted. WFL for functional activity LLE Sensation: history of peripheral neuropathy       Communication   Communication Communication: No apparent difficulties    Cognition Arousal: Alert Behavior During Therapy: WFL for tasks assessed/performed   PT - Cognitive impairments: No apparent impairments                         Following commands: Intact       Cueing Cueing  Techniques: Verbal cues     General Comments      Exercises     Assessment/Plan    PT Assessment Patient does not need any further PT services  PT Problem List         PT Treatment Interventions      PT Goals (Current goals can be found in the Care Plan section)  Acute Rehab PT Goals PT Goal Formulation: All assessment and education complete, DC therapy    Frequency       Co-evaluation               AM-PAC PT 6 Clicks Mobility  Outcome Measure Help needed turning from your back to your side while in a flat bed without using bedrails?: None Help needed moving from lying on your back to sitting on the side of a flat bed without using bedrails?: None Help needed moving to and from a bed  to a chair (including a wheelchair)?: None Help needed standing up from a chair using your arms (e.g., wheelchair or bedside chair)?: None Help needed to walk in hospital room?: None Help needed climbing 3-5 steps with a railing? : None 6 Click Score: 24    End of Session   Activity Tolerance: Patient tolerated treatment well Patient left: in bed;with nursing/sitter in room   PT Visit Diagnosis: Difficulty in walking, not elsewhere classified (R26.2)    Time: 5409-8119 PT Time Calculation (min) (ACUTE ONLY): 10 min   Charges:   PT Evaluation $PT Eval Low Complexity: 1 Low   PT General Charges $$ ACUTE PT VISIT: 1 Visit         Ozie Bo, PT, MPT   Erlene Hawks 08/17/2023, 11:57 AM

## 2023-08-17 NOTE — Interval H&P Note (Signed)
 History and Physical Interval Note:  08/17/2023 8:00 AM  Brandy Hall  has presented today for surgery, with the diagnosis of Dysphagia.  The various methods of treatment have been discussed with the patient and family. After consideration of risks, benefits and other options for treatment, the patient has consented to  Procedure(s): EGD (ESOPHAGOGASTRODUODENOSCOPY) (N/A) as a surgical intervention.  The patient's history has been reviewed, patient examined, no change in status, stable for surgery.  I have reviewed the patient's chart and labs.  Questions were answered to the patient's satisfaction.     Brandy Hall

## 2023-08-17 NOTE — Progress Notes (Signed)
 TOC meds obtained for pt, ride on the way.

## 2023-08-17 NOTE — Discharge Instructions (Addendum)
 Brandy Hall,   You came to the hospital for food getting stuck.  Gastroenterology saw you, and they dilated your esophagus.  These are the changes to medications:   -Please start Plavix on 08/20/2023, Sunday. -Please start Protonix 40 mg, take 1 tablet by mouth 2 times daily starting today  For your UTI -Please take Ceftin 500 mg, take 1 tablet tonight, starting tomorrow you can take 1 tablet by mouth 2 times daily until you are finished with your medications. -Otherwise you can continue the rest of your medications as prescribed  Please make sure you schedule an appointment with your primary care doctor.  Please make sure you schedule an appointment with your gastroenterology doctor if you are still having symptoms of food getting stuck when you swallow so they can do additional tests to find the source.  Your vascular surgery will reach out to you to schedule the procedure next week.  If you have any of these following symptoms, please call us  or seek care at an emergency department: -Chest Pain -Difficulty Breathing -Worsening abdominal pain -Syncope (passing out) -Drooping of face -Slurred speech -Sudden weakness in your leg or arm -Fever -Chills -blood in the stool -dark black, sticky stool  If you have any questions or concerns, call our clinic at [phone], [phone] or after hours call [phone] and ask for the internal medicine resident on call.  I am glad you are feeling better. It was a pleasure taking care for you. I wish a good recovery and good health!   Dr. Lanney Pitts

## 2023-08-17 NOTE — Plan of Care (Signed)
  Problem: Coping: Goal: Ability to adjust to condition or change in health will improve Outcome: Progressing   Problem: Health Behavior/Discharge Planning: Goal: Ability to identify and utilize available resources and services will improve Outcome: Progressing   Problem: Health Behavior/Discharge Planning: Goal: Ability to manage health-related needs will improve Outcome: Progressing   Problem: Nutritional: Goal: Maintenance of adequate nutrition will improve Outcome: Progressing   Problem: Education: Goal: Knowledge of General Education information will improve Description: Including pain rating scale, medication(s)/side effects and non-pharmacologic comfort measures Outcome: Progressing   Problem: Activity: Goal: Risk for activity intolerance will decrease Outcome: Progressing   Problem: Safety: Goal: Ability to remain free from injury will improve Outcome: Progressing

## 2023-08-17 NOTE — Discharge Summary (Addendum)
 Name: Brandy Hall MRN: [id number] DOB: [birth year] 63 y.o. PCP: Ardella Beaver, MD  Date of Admission: 08/16/2023  8:37 AM Date of Discharge:  08/17/2023  Attending Physician: Dr. Ancil Balzarine  DISCHARGE DIAGNOSIS:  Primary Problem: Dysphagia   Hospital Problems: Principal Problem:   Dysphagia Active Problems:   DM (diabetes mellitus) (HCC)   Tobacco use disorder   COPD (chronic obstructive pulmonary disease) (HCC)   Coronary artery disease involving native coronary artery of native heart with angina pectoris (HCC)   Sleep apnea    DISCHARGE MEDICATIONS:   Allergies as of 08/17/2023       Reactions   Rosuvastatin    Other Reaction(s): myalgias (muscle pain) rosuvastatin   Dulaglutide Nausea Only, Nausea And Vomiting, Other (See Comments)   Pancreatitis per pt Trulicity   Oxycodone-acetaminophen Hives   Oxycodone-acetaminophen Other (See Comments)   Statins Other (See Comments)   Stated shutting down organs.         Medication List     STOP taking these medications    famotidine 20 MG tablet Commonly known as: PEPCID       TAKE these medications    albuterol  108 (90 Base) MCG/ACT inhaler Commonly known as: VENTOLIN  HFA Inhale 2 puffs into the lungs every 6 (six) hours as needed for wheezing or shortness of breath.   amLODipine 5 MG tablet Commonly known as: NORVASC Take 5 mg by mouth.   ASPIRIN 81 PO 81 mg once a day   cefUROXime 500 MG tablet Commonly known as: CEFTIN Take 1 tablet (500 mg total) by mouth 2 (two) times daily with a meal for 9 doses. Starting today.   cholecalciferol 1000 units tablet Commonly known as: VITAMIN D Take 1,000 Units by mouth daily.   clopidogrel 75 MG tablet Commonly known as: PLAVIX Take 75 mg by mouth.   ezetimibe 10 MG tablet Commonly known as: ZETIA Take 10 mg by mouth daily.   guaiFENesin-codeine 100-10 MG/5ML syrup Take 10 mLs by mouth 3 (three) times daily as needed for cough.   Insulin Aspart  FlexPen 100 UNIT/ML Commonly known as: NOVOLOG Inject 0-2 Units into the skin See admin instructions.   isosorbide mononitrate 30 MG 24 hr tablet Commonly known as: IMDUR Take 30 mg by mouth 2 (two) times daily.   Lantus SoloStar 100 UNIT/ML Solostar Pen Generic drug: insulin glargine 24 units sq once a day with meals   lisinopril 5 MG tablet Commonly known as: ZESTRIL Take 10 mg by mouth daily.   metoprolol tartrate 25 MG tablet Commonly known as: LOPRESSOR Take 25 mg by mouth 2 (two) times daily.   montelukast 10 MG tablet Commonly known as: SINGULAIR Take 1 tablet by mouth at bedtime.   multivitamin capsule Take 1 capsule by mouth daily.   nitroGLYCERIN 0.4 MG SL tablet Commonly known as: NITROSTAT Place 0.4 mg under the tongue every 5 (five) minutes as needed for chest pain.   pantoprazole 40 MG tablet Commonly known as: PROTONIX Take 1 tablet (40 mg total) by mouth 2 (two) times daily before a meal.   potassium chloride 10 MEQ tablet Commonly known as: KLOR-CON Take 10 mEq by mouth daily.   Ranexa 500 MG 12 hr tablet Generic drug: ranolazine Take 500 mg by mouth 2 (two) times daily.   Trelegy Ellipta 200-62.5-25 MCG/ACT Aepb Generic drug: Fluticasone-Umeclidin-Vilant Inhale 1 puff into the lungs daily in the afternoon.        DISPOSITION AND FOLLOW-UP:  Brandy Hall was  discharged from [facility] in Good condition. At the hospital follow up visit please address:  Acute esophageal dysphagia/GERD s/p esophageal dilation: EGD did not show structural cause, improved following dilation Patient is discharged on Protonix 40 mg twice daily, please make sure patient follows up with GI in the outpatient setting. Please make sure patient is able to swallow medications, if she continues to have dysphagia-like symptoms, please make sure patient goes back and get evaluated by the GI for a modified barium esophagram. UTI Patient is discharged  on Ceftin 500 mg twice daily to complete 5 day course Please evaluate for persistent symptoms PAD Patient was scheduled for RLE Aortogram 6/11, unable to proceed with procedure d/t acute dysphagia symptoms. We spoke with the on-call vascular surgeon, who stated that they will follow-up outpatient for the procedure next week. Patient was advised to resume Plavix on 08/20/2023 on Sunday Patient does not need to hold Plavix prior to procedure that is confirmed by vascular surgery. Hypertension: Patient is discharged on home medications amlodipine 5 mg daily, lisinopril 10 mg daily, metoprolol 25 mg twice daily, Imdur 30 mg twice daily.  Follow-up Recommendations: Consults: None Labs: Basic Metabolic Profile and CBC Studies: Possibly modified barium esophagram if patient continues to have dysphagia symptoms to evaluate for oropharyngeal causes Medications:  New medications: Protonix 40 mg twice daily, Ceftin 500 mg twice daily for 9 doses Hold medications: Hold Plavix for 3 days, can start on 08/20/2023  Follow-up Appointments:  Follow-up Information     Sistasis, Rowena, MD. Schedule an appointment as soon as possible for a visit today.   Specialty: Family Medicine Why: Hospital Follow up Contact information: [address] [place] [address] [phone]                 HOSPITAL COURSE:   Brandy Hall is a 63 y.o. with a pertinent PMH of hypertension, hyperlipidemia, diabetes, COPD, palpitations, sleep apnea, IBS, GERD, history of RCA stent in 2016 and cath in January 2020, PAD who presented to the ER today after being sent from the vascular vein specially with concerns about acute dysphagia.    Acute Esophageal Dysphagia  History of GERD Presented with 4 days of dysphagia like symptoms with food getting stuck and regurgitation.  GI on board, EGD 6/12 with dilation of the lower third of the esophagus, no abnormality noted.  Normal stomach and duodenum.  No further  intervention from GI.  On exam, patient denies any abdominal pain, nausea, vomiting.  GI recommended resuming Plavix in 3 days, continue Protonix 40 mg twice daily.  If patient continues to have dysphagia-like symptoms, patient was recommended to follow-up outpatient with GI for modified barium esophagram.  On evaluation in the afternoon, patient was able to swallow tablets and able to tolerate soft diet without any regurgitation or vomiting.   UTI  Presented with symptoms of increased urine frequency, UA positive for nitrates, large leukocytes and many bacteria.  Patient was started on Macrobid 6/11, received one dose.  Patient was switched to Ceftin 500 mg twice daily to complete 5 day course due to ease of crushability prior to transition to soft diet.    PAD  Follows [facility], was planned for diagnostic abdominal aortogram RLE 6/11, but procedure was not performed due to acute dysphagia symptoms. Does report history of recurrent falls, with last fall last week. Stated that she fell backwards on her head. No dizzy spells or lightheadedness. Reports that it is d/t bilateral LE weakness, uses  walker as mobility device.  We reached out to vascular surgery, Dr. Fulton Job who spoke with Dr. Christia Cowboy, plan for outpatient abdominal aortogram next week.  No need to hold Plavix.  They will arrange in the outpatient setting.  Patient was evaluated by physical therapy without any recommendations.     T2DM  Per chart review, A1c 8.4 noted on 05/12/2023; outpatient medication regimen includes Novolog SSI. Reports that she is not taking Lantus 24 units and has not checked CBG at home. Appears to be noncompliant with insulin.  We continued SSI moderate.   HTN Outpatient medication regimen includes amlodipine 5 mg daily, lisinopril 10 mg daily, metoprolol 25 mg twice daily, Imdur 30 mg twice daily.  Tolerating medications with Ensure.  - Continue home medications amlodipine 5 mg daily, lisinopril 10 mg daily, metoprolol 25 mg  twice daily a - Continue Imdur 30 mg twice daily   CAD Hyperlipidemia Outpatient medication regimen includes Zetia 10 mg daily and Ranexa BID. - Continue Zetia   Tobacco Use Smokes 6 cigarettes daily, smoking cessation counseling is provided.    Prolonged QTc EKG showed QTc 505, avoid contributing medication.  No chest pain or shortness of breath.   History of COPD Outpatient medication regimen includes Trelegy inhaler daily, albuterol , and Singulair 10 mg daily   On exam, no shortness of breath or wheezing noted. - Continue Trelegy daily  - Continue Albuterol  PRN   DISCHARGE INSTRUCTIONS:   Discharge Instructions     Diet - low sodium heart healthy   Complete by: As directed    Discharge instructions   Complete by: As directed    Brandy Hall,   You came to the hospital for food getting stuck.  Gastroenterology saw you, and they dilated your esophagus.  These are the changes to medications:   -Please start Plavix on 08/20/2023, Sunday. -Please start Protonix 40 mg, take 1 tablet by mouth 2 times daily starting today  For your UTI -Please take Ceftin 500 mg, take 1 tablet tonight, starting tomorrow you can take 1 tablet by mouth 2 times daily until you are finished with your medications. -Otherwise you can continue the rest of your medications as prescribed  Please make sure you schedule an appointment with your primary care doctor.  Please make sure you schedule an appointment with your gastroenterology doctor if you are still having symptoms of food getting stuck when you swallow so they can do additional tests to find the source.  Your vascular surgery will reach out to you to schedule the procedure next week.  If you have any of these following symptoms, please call us  or seek care at an emergency department: -Chest Pain -Difficulty Breathing -Worsening abdominal pain -Syncope (passing out) -Drooping of face -Slurred speech -Sudden weakness in your leg or  arm -Fever -Chills -blood in the stool -dark black, sticky stool  If you have any questions or concerns, call our clinic at [phone], [phone] or after hours call [phone] and ask for the internal medicine resident on call.  I am glad you are feeling better. It was a pleasure taking care for you. I wish a good recovery and good health!   Dr. Lanney Pitts   Increase activity slowly   Complete by: As directed        SUBJECTIVE:  Patient is evaluated at bedside.  Laying in bed, no acute distress.  Denies any chest pain, shortness of breath.  Denies any nausea or vomiting. No abdominal pain. Reports the GI procedure  went well this AM, her esophagus was dilated.  Resolution of urinary symptoms.  Patient reevaluated in the afternoon, reports that she was able to swallow her medications, whole tablets without any concerns.  She was able to eat lunch without any episodes of nausea or vomiting.  Discharge Vitals:   BP (!) 172/90   Pulse 77   Temp 97.8 F (36.6 C) (Oral)   Resp 17   Ht 5' 8 (1.727 m)   Wt 87.5 kg   SpO2 99%   BMI 29.35 kg/m   OBJECTIVE:  Physical Exam   Constitutional: Laying in bed, no acute distress HENT: normocephalic atraumatic, mucous membranes moist Eyes: conjunctiva non-erythematous Cardiovascular: regular rate and rhythm Pulmonary/Chest: normal work of breathing on room air, lungs clear to auscultation bilaterally Abdominal: soft, non-tender, non-distended MSK: normal bulk and tone Neurological: Awake, alert, answering questions appropriately, no focal deficits Skin: warm and dry Psych: Pleasant  Pertinent Labs, Studies, and Procedures:     Latest Ref Rng & Units 08/16/2023    9:52 AM 08/16/2023    7:47 AM  CBC  WBC 4.0 - 10.5 K/uL 5.0    Hemoglobin 12.0 - 15.0 g/dL 73.7  10.6   Hematocrit 36.0 - 46.0 % 39.0  41.0   Platelets 150 - 400 K/uL 156         Latest Ref Rng & Units 08/17/2023    7:01 AM 08/16/2023    9:52 AM 08/16/2023     7:47 AM  CMP  Glucose 70 - 99 mg/dL 269  485  462   BUN 8 - 23 mg/dL 7  7  5    Creatinine 0.44 - 1.00 mg/dL 7.03  5.00  9.38   Sodium 135 - 145 mmol/L 134  133  134   Potassium 3.5 - 5.1 mmol/L 3.9  3.5  3.3   Chloride 98 - 111 mmol/L 103  99  96   CO2 22 - 32 mmol/L 18  21    Calcium 8.9 - 10.3 mg/dL 9.1  8.9    Total Protein 6.5 - 8.1 g/dL  6.4    Total Bilirubin 0.0 - 1.2 mg/dL  1.1    Alkaline Phos 38 - 126 U/L  78    AST 15 - 41 U/L  15    ALT 0 - 44 U/L  10      PERIPHERAL VASCULAR CATHETERIZATION Result Date: 08/16/2023 Case cancelled due to chest pain     Signed: Lanney Pitts, D.O.  Internal Medicine Resident, PGY-1 [facility]  Pager: [phone] 1:51 PM, 08/17/2023

## 2023-08-17 NOTE — Transfer of Care (Signed)
 Immediate Anesthesia Transfer of Care Note  Patient: Brandy Hall  Procedure(s) Performed: EGD (ESOPHAGOGASTRODUODENOSCOPY) DILATION, ESOPHAGUS  Patient Location: PACU  Anesthesia Type:MAC  Level of Consciousness: awake, alert , and oriented  Airway & Oxygen Therapy: Patient Spontanous Breathing  Post-op Assessment: Report given to RN, Post -op Vital signs reviewed and stable, and Patient moving all extremities  Post vital signs: Reviewed and stable  Last Vitals:  Vitals Value Taken Time  BP 151/85 08/17/23 08:45  Temp 36.5 C 08/17/23 08:45  Pulse 93 08/17/23 08:50  Resp 19 08/17/23 08:52  SpO2 95 % 08/17/23 08:50  Vitals shown include unfiled device data.  Last Pain:  Vitals:   08/17/23 0845  TempSrc:   PainSc: 0-No pain         Complications: No notable events documented.

## 2023-08-17 NOTE — Evaluation (Signed)
 Occupational Therapy Evaluation Patient Details Name: Brandy Hall MRN: [id number] DOB: 1961/01/04 Today's Date: 08/17/2023   History of Present Illness   Patient is a 63 year old female with acute dysphasia. Right toe pain with PAD.     Clinical Impressions Patient reports living alone and is Independent with ADLs and IADLs at baseline with no AD for functional mobility.  Patient's is a PLOF baseline and is able to completed functional mobility and toileting with Supervision.  No further OT intervention needed at this time.     If plan is discharge home, recommend the following:   Assistance with cooking/housework;Assist for transportation     Functional Status Assessment   Patient has had a recent decline in their functional status and demonstrates the ability to make significant improvements in function in a reasonable and predictable amount of time.     Equipment Recommendations   None recommended by OT     Recommendations for Other Services         Precautions/Restrictions   Precautions Precautions: None Restrictions Weight Bearing Restrictions Per Provider Order: No     Mobility Bed Mobility Overal bed mobility: Independent                  Transfers Overall transfer level: Modified independent Equipment used: None                      Balance                                           ADL either performed or assessed with clinical judgement   ADL Overall ADL's : Independent                                             Vision Baseline Vision/History: 1 Wears glasses Patient Visual Report: No change from baseline       Perception         Praxis         Pertinent Vitals/Pain Pain Assessment Pain Assessment: No/denies pain     Extremity/Trunk Assessment Upper Extremity Assessment Upper Extremity Assessment: Overall WFL for tasks assessed   Lower Extremity Assessment Lower  Extremity Assessment: Defer to PT evaluation   Cervical / Trunk Assessment Cervical / Trunk Assessment: Normal   Communication Communication Communication: No apparent difficulties   Cognition Arousal: Alert Behavior During Therapy: WFL for tasks assessed/performed Cognition: No apparent impairments                               Following commands: Intact       Cueing  General Comments   Cueing Techniques: Verbal cues      Exercises     Shoulder Instructions      Home Living Family/patient expects to be discharged to:: Private residence Living Arrangements: Alone Available Help at Discharge: Family Type of Home: House Home Access: Level entry     Home Layout: One level     Bathroom Shower/Tub: Chief Strategy Officer: Standard     Home Equipment: Agricultural consultant (2 wheels)          Prior Functioning/Environment Prior Level of Function : Independent/Modified Independent  Mobility Comments: only intermittent use of rolling walker      OT Problem List:     OT Treatment/Interventions:        OT Goals(Current goals can be found in the care plan section)   Acute Rehab OT Goals OT Goal Formulation: With patient Time For Goal Achievement: 08/24/23 Potential to Achieve Goals: Good   OT Frequency:  Min 1X/week    Co-evaluation              AM-PAC OT 6 Clicks Daily Activity     Outcome Measure Help from another person eating meals?: None Help from another person taking care of personal grooming?: None Help from another person toileting, which includes using toliet, bedpan, or urinal?: A Little Help from another person bathing (including washing, rinsing, drying)?: A Little Help from another person to put on and taking off regular upper body clothing?: None Help from another person to put on and taking off regular lower body clothing?: None 6 Click Score: 22   End of Session Nurse Communication:  Mobility status  Activity Tolerance: Patient tolerated treatment well Patient left: in bed;with call bell/phone within reach                   Time: 1455-1505 OT Time Calculation (min): 10 min Charges:  OT General Charges $OT Visit: 1 Visit OT Evaluation $OT Eval Low Complexity: 1 Low Lovett Ruck OT/L  Lacretia Piccolo 08/17/2023, 3:28 PM

## 2023-08-17 NOTE — Care Management CC44 (Signed)
 Condition Code 44 Documentation Completed  Patient Details  Name: JAKALA HERFORD MRN: [id number] Date of Birth: Jun 25, 1960   Condition Code 44 given:  Yes Patient signature on Condition Code 44 notice:  Yes Documentation of 2 MD's agreement:  Yes Code 44 added to claim:  Yes    Eusebio High, RN 08/17/2023, 11:16 AM

## 2023-08-17 NOTE — Progress Notes (Signed)
 Pt ready to go, AVS copy given and reviewed with pt, she has understanding of her appt on 6/18 and that she can begin taking plavix on 6/15. No further questions about home self care.

## 2023-08-17 NOTE — Care Management Obs Status (Signed)
 MEDICARE OBSERVATION STATUS NOTIFICATION   Patient Details  Name: MELIAH APPLEMAN MRN: [id number] Date of Birth: 26-Mar-1960   Medicare Observation Status Notification Given:  Yes    Eusebio High, RN 08/17/2023, 11:16 AM

## 2023-08-21 ENCOUNTER — Encounter (HOSPITAL_COMMUNITY): Payer: Self-pay | Admitting: Gastroenterology

## 2023-08-28 ENCOUNTER — Telehealth: Payer: Self-pay

## 2023-08-28 NOTE — Telephone Encounter (Signed)
 Attempted to return patient's call after she called to cancel Angio scheduled for 6/25.  No answer / no VM

## 2023-08-28 NOTE — Telephone Encounter (Signed)
 Attempted to call for surgery scheduling. Voice mail not set up.

## 2023-08-29 ENCOUNTER — Telehealth: Payer: Self-pay

## 2023-08-29 NOTE — Telephone Encounter (Signed)
 Attempted to call for surgery scheduling. LVM

## 2023-08-30 ENCOUNTER — Ambulatory Visit (HOSPITAL_COMMUNITY): Admission: RE | Admit: 2023-08-30 | Source: Home / Self Care | Admitting: Vascular Surgery

## 2023-08-30 ENCOUNTER — Encounter (HOSPITAL_COMMUNITY): Admission: RE | Payer: Self-pay | Source: Home / Self Care

## 2023-08-30 ENCOUNTER — Telehealth: Payer: Self-pay

## 2023-08-30 SURGERY — ABDOMINAL AORTOGRAM
Anesthesia: LOCAL

## 2023-08-30 NOTE — Telephone Encounter (Signed)
 Patient has cancelled abd aortogram with RLE runoff twice.  States having transportation difficulties.  Attempted to call to inform that her insurance does offer transportation services.  No returned call from patient.  Letter sent.

## 2023-09-06 ENCOUNTER — Encounter (HOSPITAL_COMMUNITY)
Admission: RE | Disposition: A | Discharge status: Home / Self Care | Payer: Self-pay | Source: Home / Self Care | Attending: Vascular Surgery

## 2023-09-06 ENCOUNTER — Ambulatory Visit (HOSPITAL_COMMUNITY)
Admission: RE | Admit: 2023-09-06 | Discharge: 2023-09-06 | Disposition: A | Discharge status: Home / Self Care | Attending: Vascular Surgery | Admitting: Vascular Surgery

## 2023-09-06 ENCOUNTER — Other Ambulatory Visit: Payer: Self-pay

## 2023-09-06 DIAGNOSIS — I10 Essential (primary) hypertension: Secondary | ICD-10-CM | POA: Insufficient documentation

## 2023-09-06 DIAGNOSIS — F17210 Nicotine dependence, cigarettes, uncomplicated: Secondary | ICD-10-CM | POA: Diagnosis not present

## 2023-09-06 DIAGNOSIS — E7800 Pure hypercholesterolemia, unspecified: Secondary | ICD-10-CM | POA: Insufficient documentation

## 2023-09-06 DIAGNOSIS — Z7982 Long term (current) use of aspirin: Secondary | ICD-10-CM | POA: Diagnosis not present

## 2023-09-06 DIAGNOSIS — Z7901 Long term (current) use of anticoagulants: Secondary | ICD-10-CM | POA: Insufficient documentation

## 2023-09-06 DIAGNOSIS — Z79899 Other long term (current) drug therapy: Secondary | ICD-10-CM | POA: Insufficient documentation

## 2023-09-06 DIAGNOSIS — I252 Old myocardial infarction: Secondary | ICD-10-CM | POA: Diagnosis not present

## 2023-09-06 DIAGNOSIS — I70211 Atherosclerosis of native arteries of extremities with intermittent claudication, right leg: Secondary | ICD-10-CM | POA: Diagnosis not present

## 2023-09-06 DIAGNOSIS — M79674 Pain in right toe(s): Secondary | ICD-10-CM | POA: Diagnosis not present

## 2023-09-06 DIAGNOSIS — E1151 Type 2 diabetes mellitus with diabetic peripheral angiopathy without gangrene: Secondary | ICD-10-CM | POA: Insufficient documentation

## 2023-09-06 DIAGNOSIS — I739 Peripheral vascular disease, unspecified: Secondary | ICD-10-CM

## 2023-09-06 HISTORY — PX: LOWER EXTREMITY ANGIOGRAPHY: CATH118251

## 2023-09-06 HISTORY — PX: ABDOMINAL AORTOGRAM: CATH118222

## 2023-09-06 LAB — POCT I-STAT, CHEM 8
BUN: 11 mg/dL (ref 8–23)
Calcium, Ion: 1.24 mmol/L (ref 1.15–1.40)
Chloride: 102 mmol/L (ref 98–111)
Creatinine, Ser: 0.8 mg/dL (ref 0.44–1.00)
Glucose, Bld: 91 mg/dL (ref 70–99)
HCT: 46 % (ref 36.0–46.0)
Hemoglobin: 15.6 g/dL — ABNORMAL HIGH (ref 12.0–15.0)
Potassium: 4.8 mmol/L (ref 3.5–5.1)
Sodium: 137 mmol/L (ref 135–145)
TCO2: 29 mmol/L (ref 22–32)

## 2023-09-06 LAB — GLUCOSE, CAPILLARY: Glucose-Capillary: 106 mg/dL — ABNORMAL HIGH (ref 70–99)

## 2023-09-06 SURGERY — ABDOMINAL AORTOGRAM
Anesthesia: LOCAL

## 2023-09-06 MED ORDER — SODIUM CHLORIDE 0.9 % IV SOLN: 250.0000 mL | INTRAVENOUS | Status: DC | PRN

## 2023-09-06 MED ORDER — SODIUM CHLORIDE 0.9% FLUSH: 3.0000 mL | Freq: Two times a day (BID) | INTRAVENOUS | Status: DC

## 2023-09-06 MED ORDER — SODIUM CHLORIDE 0.9 % WEIGHT BASED INFUSION: 1.0000 mL/kg/h | INTRAVENOUS | Status: DC

## 2023-09-06 MED ORDER — LIDOCAINE HCL (PF) 1 % IJ SOLN
INTRAMUSCULAR | Status: DC | PRN
  Administered 2023-09-06: 10 mL

## 2023-09-06 MED ORDER — MIDAZOLAM HCL 2 MG/2ML IJ SOLN
INTRAMUSCULAR | Status: AC
  Filled 2023-09-06: qty 2

## 2023-09-06 MED ORDER — HEPARIN (PORCINE) IN NACL 1000-0.9 UT/500ML-% IV SOLN
INTRAVENOUS | Status: DC | PRN
  Administered 2023-09-06 (×2): 500 mL

## 2023-09-06 MED ORDER — LABETALOL HCL 5 MG/ML IV SOLN: 10.0000 mg | INTRAVENOUS | Status: DC | PRN

## 2023-09-06 MED ORDER — SODIUM CHLORIDE 0.9% FLUSH: 3.0000 mL | INTRAVENOUS | Status: DC | PRN

## 2023-09-06 MED ORDER — MIDAZOLAM HCL 2 MG/2ML IJ SOLN
INTRAMUSCULAR | Status: DC | PRN
  Administered 2023-09-06: 1 mg via INTRAVENOUS

## 2023-09-06 MED ORDER — FENTANYL CITRATE (PF) 100 MCG/2ML IJ SOLN
INTRAMUSCULAR | Status: AC
  Filled 2023-09-06: qty 2

## 2023-09-06 MED ORDER — SODIUM CHLORIDE 0.9 % IV SOLN: INTRAVENOUS | Status: DC

## 2023-09-06 MED ORDER — HYDRALAZINE HCL 20 MG/ML IJ SOLN: 5.0000 mg | INTRAMUSCULAR | Status: DC | PRN

## 2023-09-06 MED ORDER — LIDOCAINE HCL (PF) 1 % IJ SOLN
INTRAMUSCULAR | Status: AC
  Filled 2023-09-06: qty 30

## 2023-09-06 MED ORDER — ONDANSETRON HCL 4 MG/2ML IJ SOLN: 4.0000 mg | Freq: Four times a day (QID) | INTRAMUSCULAR | Status: DC | PRN

## 2023-09-06 MED ORDER — FENTANYL CITRATE (PF) 100 MCG/2ML IJ SOLN
INTRAMUSCULAR | Status: DC | PRN
  Administered 2023-09-06: 50 ug via INTRAVENOUS

## 2023-09-06 SURGICAL SUPPLY — 10 items
CATH OMNI FLUSH 5F 65CM (CATHETERS) IMPLANT
DEVICE CLOSURE MYNXGRIP 5F (Vascular Products) IMPLANT
GLIDEWIRE ADV .035X260CM (WIRE) IMPLANT
KIT MICROPUNCTURE NIT STIFF (SHEATH) IMPLANT
KIT SYRINGE INJ CVI SPIKEX1 (MISCELLANEOUS) IMPLANT
SET ATX-X65L (MISCELLANEOUS) IMPLANT
SHEATH PINNACLE 5F 10CM (SHEATH) IMPLANT
SHEATH PROBE COVER 6X72 (BAG) IMPLANT
TRAY PV CATH (CUSTOM PROCEDURE TRAY) ×3 IMPLANT
WIRE BENTSON .035X145CM (WIRE) IMPLANT

## 2023-09-06 NOTE — Op Note (Signed)
 Patient name: Brandy Hall MRN: [id number] DOB: 1960-10-31 Sex: female  09/06/2023 Pre-operative Diagnosis: Bilateral lower extremity lifestyle and claudication, right greater than left Post-operative diagnosis:  Same Surgeon:  Fonda FORBES Rim, MD Procedure Performed: 1.  Ultrasound-guided micropuncture access of the left common femoral artery in retrograde fashion 2.  Aortogram 3.  Second-order cannulation, right lower extremity angiogram 4.  Third order cannulation, right lower extremity angiogram 5.  Moderate sedation time 20 minutes, contrast volume 120 mL   Indications: Patient is a 64 year old female with previous history of right sided SFA balloon angioplasty by my partner Dr. Harvey years ago.  She presents today with known ingrown toenail on the right great toe, as well as bilateral lower extremity claudication.  Imaging demonstrates right CIA stenosis.  She would like to have her ingrown toenail removed, however I am concerned that with removal, especially in the setting of lifestyle and claudication, that she may not have the perfusion necessary to heal.  We discussed the risks and benefits of right lower extremity angiogram in effort to define, possibly improve perfusion in the right lower extremity.  After discussing risks and benefits, Brandy Hall elected to proceed.  Findings:   Aorta: Widely patent celiac, SMA, bilateral renal arteries Infrarenal aorta with ectasia versus small aneurysm. Bilateral common iliac arteries with ostial stenosis measuring 80%  - 20 mm gradient bilaterally Ectasia of bilateral common iliac arteries  Widely patent hypogastric arteries, external iliac arteries.    On the right: Widely patent common femoral artery, profunda, superficial femoral artery.  There is sluggish flow distally, however the popliteal artery is widely patent.  There is a high takeoff of the anterior tibial artery.  There is three-vessel runoff to the foot with small vessel  disease in the foot.  The dorsalis pedis artery occludes proximally.  The foot is fed through plantar arteries filling from the posterior tibial artery.   Procedure:  The patient was identified in the holding area and taken to room 8.  The patient was then placed supine on the table and prepped and draped in the usual sterile fashion.  A time out was called.  Ultrasound was used to evaluate the left common femoral artery.  It was patent .  A digital ultrasound image was acquired.  A micropuncture needle was used to access the left common femoral artery under ultrasound guidance.  An 018 wire was advanced without resistance and a micropuncture sheath was placed.  The 018 wire was removed and a benson wire was placed.  The micropuncture sheath was exchanged for a 5 french sheath.  An omniflush catheter was advanced over the wire to the level of L-1.  An abdominal angiogram was obtained.  Next, using the omniflush catheter and a benson wire, the aortic bifurcation was crossed and the catheter was placed into theright external iliac artery and right runoff was obtained.  Due to slow flow distally, a wire and catheter were advanced into the superficial femoral artery for right lower extremity angiography.  Impression: Greater than 80% ostial stenosis of bilateral common iliac arteries.  There appears to be ectasia of the infrarenal aorta as well as bilateral common iliac arteries. I chose not to proceed with stenting as I did not want to kiss iliac stents and negate future intervention.  I think Brandy Hall would be best served with CT angio abdomen pelvis to allow for accurate measurements of the infrarenal aorta, common iliac arteries bilaterally.  Regarding the right great toe, and the  ingrown toenail that needs surgery, I plan to discuss options with Brandy Hall.  She has palpable pulses in her groin bilaterally.  There is a chance that the wound would heal without intervention, especially with a preserved toe pressure,  however with microvascular disease from longstanding diabetes and smoking, there is a risk that the wound breaks down.  Options include iliac intervention prior to toe surgery versus iliac intervention if wound healing stagnates after toe surgery.  It will be her decision.  My plan is to see her in a few weeks with CT angio abdomen pelvis to have a detailed discussion regarding her options.  Fonda FORBES Rim MD [facility] Office: [phone]

## 2023-09-06 NOTE — H&P (Signed)
 Patient seen and examined in preop holding.  No complaints. No changes to medication history or physical exam since last seen in clinic. After discussing the risks and benefits of RLE angiogram for CLI with rest pain , Brandy Hall elected to proceed.   Brandy FORBES Rim MD       Office Note     CC:  Right toe pain with PAD Requesting Provider:  No ref. provider found  HPI: Brandy Hall is a 63 y.o. (December 23, 1960) female presenting at the request of Dia McCaughan D.P.M.  for right great toe pain.  A few months ago, the patient was seen in podiatry clinic for right great toe pain.  Has previous history of nail removed 20 years ago.  She felt that with the nail growing back, it was similar to pain than what she had previously.  The toe was evaluated, and there was some concern that the pain was due to peripheral arterial disease, therefore she was sent to our clinic.  One exam, Brandy Hall was doing well, accompanied by her daughter.  [place].  She worked for the [employer] for years prior to retirement.  She now collects pension.  Regarding right toe pain.  The pain is not constant.  It waxes and wanes.  It usually occurs a few times per week, and is aggravated when she hits her toe on something.  The pain resolves with time.  She denies open wound, erythema, induration.  She is a daily cigarette smoker with history of MI longstanding diabetes-most recent A1c 8.8.  At baseline, Brandy Hall has difficulty with ambulation.  She notes unsteadiness in the right leg.  She denies history of claudication, ischemic rest pain, tissue loss. She can ambulate roughly 10 feet at a time.  The pt is not on a statin for cholesterol management.  The pt is  on a daily aspirin .   Other AC:  - The pt is  on medication for hypertension.   The pt is not diabetic.  Tobacco hx:  current  Past Medical History:  Diagnosis Date   Allergic rhinitis    COPD (chronic obstructive  pulmonary disease) (HCC)    Diabetes (HCC)    GERD (gastroesophageal reflux disease)    History of colon polyps    Hypercholesteremia    Hypertension    IBS (irritable bowel syndrome)    MI (myocardial infarction) (HCC) 2016   Seasonal allergies    Trigeminal neuralgia     Past Surgical History:  Procedure Laterality Date   ABDOMINAL AORTOGRAM N/A 08/16/2023   Procedure: ABDOMINAL AORTOGRAM;  Surgeon: Hall Brandy FORBES, MD;  Location: [facility] INVASIVE CV LAB;  Service: Cardiovascular;  Laterality: N/A;   ABDOMINAL HYSTERECTOMY     BREAST CYST ASPIRATION Right    COLONOSCOPY  09/15/2014   [facility]. Multiple small polyps in the transverse and sigmoid colon, not removed today as the patient is on clopidogrel   ELBOW SURGERY     bilat   ESOPHAGEAL DILATION  08/17/2023   Procedure: DILATION, ESOPHAGUS;  Surgeon: Shila Gustav GAILS, MD;  Location: [facility] ENDOSCOPY;  Service: Gastroenterology;;   ESOPHAGOGASTRODUODENOSCOPY  02/06/2014   Small hiatal hernia. Mild gastritis. Gastric polyp status post polypectomy.   ESOPHAGOGASTRODUODENOSCOPY N/A 08/17/2023   Procedure: EGD (ESOPHAGOGASTRODUODENOSCOPY);  Surgeon: Nandigam, Kavitha V, MD;  Location: [facility] ENDOSCOPY;  Service: Gastroenterology;  Laterality: N/A;   GALLBLADDER SURGERY     KNEE SURGERY     left  LAPAROSCOPY  1999   showed endometriosis   LOWER EXTREMITY ANGIOGRAPHY N/A 08/16/2023   Procedure: Lower Extremity Angiography;  Surgeon: Lanis Brandy BRAVO, MD;  Location: [facility] INVASIVE CV LAB;  Service: Cardiovascular;  Laterality: N/A;   OOPHORECTOMY  11/15/2013   Dr Sherrine- and salpingectomy   ROTATOR CUFF REPAIR     left    Social History   Socioeconomic History   Marital status: Single    Spouse name: Not on file   Number of children: Not on file   Years of education: Not on file   Highest education level: Not on file  Occupational History   Not on file  Tobacco Use   Smoking status: Every Day    Current packs/day: 0.50     Average packs/day: 0.5 packs/day for 25.0 years (12.5 ttl pk-yrs)    Types: Cigarettes   Smokeless tobacco: Never  Vaping Use   Vaping status: Never Used  Substance and Sexual Activity   Alcohol use: No   Drug use: Never   Sexual activity: Not on file  Other Topics Concern   Not on file  Social History Narrative   Single Lives with grandchild.   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Drivers of Corporate investment banker Strain: Not on [employer] Insecurity: No Food Insecurity (08/16/2023)   Hunger Vital Sign    Worried About Running Out of Food in the Last Year: Never true    Ran Out of Food in the Last Year: Never true  Transportation Needs: No Transportation Needs (08/16/2023)   PRAPARE - Administrator, Civil Service (Medical): No    Lack of Transportation (Non-Medical): No  Physical Activity: Not on file  Stress: Not on file  Social Connections: Unknown (08/16/2023)   Social Connection and Isolation Panel    Frequency of Communication with Friends and Family: Twice a week    Frequency of Social Gatherings with Friends and Family: Once a week    Attends Religious Services: Patient declined    Database administrator or Organizations: Patient declined    Attends Banker Meetings: Patient declined    Marital Status: Never married  Intimate Partner Violence: Not At Risk (08/16/2023)   Humiliation, Afraid, Rape, and Kick questionnaire    Fear of Current or Ex-Partner: No    Emotionally Abused: No    Physically Abused: No    Sexually Abused: No   No family history on file.  Current Facility-Administered Medications  Medication Dose Route Frequency Provider Last Rate Last Admin   0.9 %  sodium chloride  infusion   Intravenous Continuous Patirica Longshore E, MD       Heparin  (Porcine) in NaCl 1000-0.9 UT/500ML-% SOLN    PRN Shanaia Sievers E, MD   500 mL at 09/06/23 0906    Allergies  Allergen Reactions   Rosuvastatin     Other Reaction(s): myalgias (muscle  pain)  rosuvastatin   Dulaglutide Nausea Only, Nausea And Vomiting and Other (See Comments)    Pancreatitis per pt  Trulicity   Oxycodone-Acetaminophen Hives   Statins Other (See Comments)    Stated shutting down organs.      REVIEW OF SYSTEMS:  [X]  denotes positive finding, [ ]  denotes negative finding Cardiac  Comments:  Chest pain or chest pressure:    Shortness of breath upon exertion:    Short of breath when lying flat:    Irregular heart rhythm:        Vascular  Pain in calf, thigh, or hip brought on by ambulation:    Pain in feet at night that wakes you up from your sleep:     Blood clot in your veins:    Leg swelling:         Pulmonary    Oxygen at home:    Productive cough:     Wheezing:         Neurologic    Sudden weakness in arms or legs:     Sudden numbness in arms or legs:     Sudden onset of difficulty speaking or slurred speech:    Temporary loss of vision in one eye:     Problems with dizziness:         Gastrointestinal    Blood in stool:     Vomited blood:         Genitourinary    Burning when urinating:     Blood in urine:        Psychiatric    Major depression:         Hematologic    Bleeding problems:    Problems with blood clotting too easily:        Skin    Rashes or ulcers:        Constitutional    Fever or chills:      PHYSICAL EXAMINATION:  Vitals:   09/06/23 0735 09/06/23 0740 09/06/23 0746 09/06/23 0906  BP: (!) (P) 159/107 (!) 184/91 (!) 169/91   Pulse: (P) 78     Resp: (P) 16     Temp: (P) 97.8 F (36.6 C)     TempSrc: (P) Oral     SpO2: (P) 100%   98%  Weight: (P) 83.9 kg     Height: (P) 5' 8 (1.727 m)       General:  WDWN in NAD; vital signs documented above Gait: Not observed HENT: WNL, normocephalic Pulmonary: normal non-labored breathing , without wheezing Cardiac: regular HR Abdomen: soft, NT, no masses Skin: without rashes Vascular Exam/Pulses:  Right Left  Radial 2+ (normal) 2+ (normal)   Ulnar    Femoral 1+ (weak) 2+ (normal)  Popliteal    DP absent 2+ (normal)  PT     Extremities: without ischemic changes, without Gangrene , without cellulitis; without open wounds;  Musculoskeletal: no muscle wasting or atrophy  Neurologic: A&O X 3;  No focal weakness or paresthesias are detected Psychiatric:  The pt has Normal affect.   Non-Invasive Vascular Imaging:     +----------+--------+-----+---------------+--------------+--------+  RIGHT    PSV cm/sRatioStenosis       Waveform      Comments  +----------+--------+-----+---------------+--------------+--------+  CIA Prox  385     4/1  75-99% stenosismonophasic              +----------+--------+-----+---------------+--------------+--------+  EIA Distal61                          monophasic              +----------+--------+-----+---------------+--------------+--------+  CFA Distal43                          monophasic              +----------+--------+-----+---------------+--------------+--------+  DFA      22  monophasic              +----------+--------+-----+---------------+--------------+--------+  SFA Prox  41                          monophasic              +----------+--------+-----+---------------+--------------+--------+  SFA Mid   33                          monophasic              +----------+--------+-----+---------------+--------------+--------+  SFA Distal33                          monophasic              +----------+--------+-----+---------------+--------------+--------+  POP Prox  18                          monophasic              +----------+--------+-----+---------------+--------------+--------+  ATA Mid                               not identified          +----------+--------+-----+---------------+--------------+--------+  PTA Mid   17                          monophasic               +----------+--------+-----+---------------+--------------+--------+  PERO Mid  17                          monophasic              +----------+--------+-----+---------------+--------------+--------+   A focal velocity elevation of was obtained at proximal common iliac  artery. Findings are characteristic of 75-99% stenosis.      +----------+--------+-----+--------+---------+--------+  LEFT     PSV cm/sRatioStenosisWaveform Comments  +----------+--------+-----+--------+---------+--------+  EIA Distal103                  triphasic          +----------+--------+-----+--------+---------+--------+  CFA Distal81                   triphasic          +----------+--------+-----+--------+---------+--------+  DFA      77                   triphasic          +----------+--------+-----+--------+---------+--------+  SFA Prox  65                   triphasic          +----------+--------+-----+--------+---------+--------+  SFA Mid   66                   triphasic          +----------+--------+-----+--------+---------+--------+  SFA Distal48                   triphasic          +----------+--------+-----+--------+---------+--------+  POP Prox  40                   triphasic          +----------+--------+-----+--------+---------+--------+  ATA Mid   23                   triphasic          +----------+--------+-----+--------+---------+--------+  PTA Mid   30                   triphasic          +----------+--------+-----+--------+---------+--------+  PERO Mid  36                   triphasic          +----------+--------+-----+--------+---------+--------+     ABI Findings:  +---------+------------------+-----+-----------+--------+  Right   Rt Pressure (mmHg)IndexWaveform   Comment   +---------+------------------+-----+-----------+--------+  Brachial 178                                          +---------+------------------+-----+-----------+--------+  PTA     116               0.64 multiphasic          +---------+------------------+-----+-----------+--------+  DP      125               0.69 multiphasic          +---------+------------------+-----+-----------+--------+  Great Toe131               0.72                      +---------+------------------+-----+-----------+--------+   +---------+------------------+-----+--------+-------+  Left    Lt Pressure (mmHg)IndexWaveformComment  +---------+------------------+-----+--------+-------+  Brachial 181                                     +---------+------------------+-----+--------+-------+  PTA     177               0.98                  +---------+------------------+-----+--------+-------+  DP      179               0.99                  +---------+------------------+-----+--------+-------+  Great Toe154               0.85                  +---------+------------------+-----+--------+-------+   +-------+-----------+-----------+------------+------------+  ABI/TBIToday's ABIToday's TBIPrevious ABIPrevious TBI  +-------+-----------+-----------+------------+------------+  Right .69        .72                                  +-------+-----------+-----------+------------+------------+  Left  .99        .85                                  +-------+-----------+-----------+------------+------------+       ASSESSMENT/PLAN: Brandy Hall is a 63 y.o. female presenting with waxing and waning right great toe pain with concern for arterial compromise.    The toe pain is inconsistent with arterial insufficiency as it waxes and wanes, and is present mainly when the patient  bumps the toe.  She is convinced that the pain is similar to what she had 20 years ago when she needed the toenail removed.  On physical exam she had a nonpalpable pulse in the right foot, 1+  femoral pulse on the right. Imaging was reviewed demonstrating right-sided iliac stenosis on arterial duplex.  Upon review of ABI, I think that values are falsely elevated, especially at the right toe which demonstrates a toe pressure of 131, but very minimal phasicity.  I had a long discussion with Brandy Hall regarding the above.  With her lifestyle choices such as daily smoking for decades, comorbidities which include longstanding diabetes, and nonpalpable pulse in the foot.  I would recommend diagnostic angiography with possible intervention in the right lower extremity prior to toenail removal should this be deemed necessary by her podiatrist.  Longstanding diabetes damages in the microcirculation, especially at the level of the feet and toes.  With a nonpalpable pulse in the foot, I am worried that she would have wound healing issues at the toenail removal site.  The other option would be continued conservative management.  Brandy Hall stated that the toenail needs to go, and that she will discuss this with her podiatrist.  I have discussed her care with my office.  We plan to refer her back to her podiatrist Dr.McCaughan.  Should they move forward with toenail removal, I asked her to call my office for angiography scheduling.  After discussing risk and benefits the above, Brandy Hall elected to proceed.  We discussed the importance of smoking cessation.  She is aware that if a stent were needed, smoking would increase risk of subsequent occlusion. Will need to hold Eliquis 2 days before.  Brandy FORBES Rim, MD [facility] [phone] Total time of patient care including pre-visit research, consultation, and documentation greater than 45 minutes

## 2023-09-06 NOTE — Discharge Instructions (Signed)
 Femoral Site Care The following information offers guidance on how to care for yourself after your procedure. Your health care provider may also give you more specific instructions. If you have problems or questions, contact your health care provider. What can I expect after the procedure? After the procedure, it is common to have bruising and tenderness at the incision site. This usually fades within 1-2 weeks. Follow these instructions at home: Incision site care  Follow instructions from your health care provider about how to take care of your incision site. Make sure you: Wash your hands with soap and water for at least 20 seconds before and after you change your bandage (dressing). If soap and water are not available, use hand sanitizer. Remove your dressing in 24 hours. Leave stitches (sutures), skin glue, or adhesive strips in place. These skin closures may need to stay in place for 2 weeks or longer. If adhesive strip edges start to loosen and curl up, you may trim the loose edges. Do not remove adhesive strips completely unless your health care provider tells you to do that. Do not take baths, swim, or use a hot tub for at least 1 week. You may shower 24 hours after the procedure or as told by your health care provider. Gently wash the incision site with plain soap and water. Pat the area dry with a clean towel. Do not rub the site. This may cause bleeding. Do not apply powder or lotion to the site. Keep the site clean and dry. Check your femoral site every day for signs of infection. Check for: Redness, swelling, or pain. Fluid or blood. Warmth. Pus or a bad smell. Activity If you were given a sedative during the procedure, it can affect you for several hours. Do not drive or operate machinery until your health care provider says that it is safe. Rest as told by your health care provider. Avoid sitting for a long time without moving. Get up to take short walks every 1-2 hours. This  is important to improve blood flow and breathing. Ask for help if you feel weak or unsteady. Return to your normal activities as told by your health care provider. Ask your health care provider what activities are safe for you and when you can return to work. Avoid activities that take a lot of effort for the first 2-3 days after your procedure, or as long as directed. Do not lift anything that is heavier than 10 lb (4.5 kg), or the limit that you are told, until your health care provider says that it is safe. General instructions Take over-the-counter and prescription medicines only as told by your health care provider. If you will be going home right after the procedure, plan to have a responsible adult care for you for the time you are told. This is important. Keep all follow-up visits. This is important. Contact a health care provider if: You have a fever or chills. You have any of these signs of infection at your incision site: Redness, swelling, or pain. Fluid or blood. Warmth. Pus or a bad smell. Get help right away if: The incision area swells very fast. The incision area is bleeding, and the bleeding does not stop when you hold steady pressure on the area. Your leg or foot becomes pale, cool, tingly, or numb. These symptoms may represent a serious problem that is an emergency. Do not wait to see if the symptoms will go away. Get medical help right away. Call your local emergency  services (911 in the [place] Do not drive yourself to the hospital. Summary After the procedure, it is common to have bruising and tenderness that fade within 1-2 weeks. Check your femoral site every day for signs of infection. Do not lift anything that is heavier than 10 lb (4.5 kg), or the limit that you are told, until your health care provider says that it is safe. Get help right away if the incision area swells very fast, you have bleeding at the incision area that does not stop, or your leg or foot  becomes pale, cool, or numb. This information is not intended to replace advice given to you by your health care provider. Make sure you discuss any questions you have with your health care provider. Document Revised: 11/11/2020 Document Reviewed: 04/12/2020 Elsevier Patient Education  2024 [employer].

## 2023-09-07 ENCOUNTER — Encounter (HOSPITAL_COMMUNITY): Payer: Self-pay | Admitting: Vascular Surgery

## 2023-09-11 ENCOUNTER — Other Ambulatory Visit: Payer: Self-pay

## 2023-09-11 DIAGNOSIS — I77811 Abdominal aortic ectasia: Secondary | ICD-10-CM

## 2023-09-11 DIAGNOSIS — I771 Stricture of artery: Secondary | ICD-10-CM

## 2023-09-28 ENCOUNTER — Inpatient Hospital Stay (HOSPITAL_BASED_OUTPATIENT_CLINIC_OR_DEPARTMENT_OTHER): Admission: RE | Admit: 2023-09-28 | Source: Ambulatory Visit | Admitting: Radiology

## 2023-10-12 ENCOUNTER — Ambulatory Visit: Admitting: Vascular Surgery

## 2023-12-20 ENCOUNTER — Ambulatory Visit: Admitting: Podiatry

## 2023-12-20 ENCOUNTER — Ambulatory Visit (INDEPENDENT_AMBULATORY_CARE_PROVIDER_SITE_OTHER)

## 2023-12-20 VITALS — BP 143/76 | HR 90 | Temp 98.1°F | Resp 15

## 2023-12-20 DIAGNOSIS — E1151 Type 2 diabetes mellitus with diabetic peripheral angiopathy without gangrene: Secondary | ICD-10-CM

## 2023-12-20 DIAGNOSIS — M25471 Effusion, right ankle: Secondary | ICD-10-CM

## 2023-12-20 DIAGNOSIS — M25571 Pain in right ankle and joints of right foot: Secondary | ICD-10-CM | POA: Diagnosis not present

## 2023-12-20 DIAGNOSIS — R600 Localized edema: Secondary | ICD-10-CM | POA: Diagnosis not present

## 2023-12-20 DIAGNOSIS — M79674 Pain in right toe(s): Secondary | ICD-10-CM

## 2023-12-20 MED ORDER — CEPHALEXIN 500 MG PO CAPS: 500.0000 mg | ORAL_CAPSULE | Freq: Three times a day (TID) | ORAL | 0 refills | Status: AC

## 2023-12-20 MED ORDER — JOURNAVX 50 MG PO TABS: 50.0000 mg | ORAL_TABLET | Freq: Two times a day (BID) | ORAL | 0 refills | Status: AC

## 2023-12-20 NOTE — Progress Notes (Signed)
 Chief Complaint  Patient presents with   Nail Problem    Right hallux, nail is very very painful. However, the right ankle is super swollen, she states that there is no blockage, and no fluid around her heart. A little argumentative about it. Toes are ICE cold but ankle is warm to touch.  A1c is not listed on labs. ASA and Plavix.    HPI: 63 y.o. female presents today as an emergency appointment for significant pain in her right ankle.  She thinks this may be because of her right great toenail that she wants to have removed.  She was referred to vascular due to concern of possible poor healing potential due to restricted arterial flow.  Based on the vascular studies that were performed, the vascular surgeon felt the patient could heal from a toenail avulsion at this time, but states that other comorbidities such as the diabetes may or may not result in poor healing as well.  She denies any drainage from the great toe.  Denies injury to the ankle.  She notes that she has a current UTI and is awaiting an appointment with her specialist for evaluation.  She is not currently on any antibiotics.  Denies fever.  Past Medical History:  Diagnosis Date   Allergic rhinitis    COPD (chronic obstructive pulmonary disease) (HCC)    Diabetes (HCC)    GERD (gastroesophageal reflux disease)    History of colon polyps    Hypercholesteremia    Hypertension    IBS (irritable bowel syndrome)    MI (myocardial infarction) (HCC) 2016   Seasonal allergies    Trigeminal neuralgia    Past Surgical History:  Procedure Laterality Date   ABDOMINAL AORTOGRAM N/A 08/16/2023   Procedure: ABDOMINAL AORTOGRAM;  Surgeon: Lanis Fonda BRAVO, MD;  Location: [facility] INVASIVE CV LAB;  Service: Cardiovascular;  Laterality: N/A;   ABDOMINAL AORTOGRAM N/A 09/06/2023   Procedure: ABDOMINAL AORTOGRAM;  Surgeon: Lanis Fonda BRAVO, MD;  Location: [facility] INVASIVE CV LAB;  Service: Cardiovascular;  Laterality: N/A;   ABDOMINAL HYSTERECTOMY      BREAST CYST ASPIRATION Right    COLONOSCOPY  09/15/2014   [facility] Multiple small polyps in the transverse and sigmoid colon, not removed today as the patient is on clopidogrel   ELBOW SURGERY     bilat   ESOPHAGEAL DILATION  08/17/2023   Procedure: DILATION, ESOPHAGUS;  Surgeon: Shila Gustav GAILS, MD;  Location: [facility] ENDOSCOPY;  Service: Gastroenterology;;   ESOPHAGOGASTRODUODENOSCOPY  02/06/2014   Small hiatal hernia. Mild gastritis. Gastric polyp status post polypectomy.   ESOPHAGOGASTRODUODENOSCOPY N/A 08/17/2023   Procedure: EGD (ESOPHAGOGASTRODUODENOSCOPY);  Surgeon: Nandigam, Kavitha V, MD;  Location: [facility] ENDOSCOPY;  Service: Gastroenterology;  Laterality: N/A;   GALLBLADDER SURGERY     KNEE SURGERY     left   LAPAROSCOPY  1999   showed endometriosis   LOWER EXTREMITY ANGIOGRAPHY N/A 08/16/2023   Procedure: Lower Extremity Angiography;  Surgeon: Lanis Fonda BRAVO, MD;  Location: [facility] INVASIVE CV LAB;  Service: Cardiovascular;  Laterality: N/A;   LOWER EXTREMITY ANGIOGRAPHY Left 09/06/2023   Procedure: Lower Extremity Angiography;  Surgeon: Lanis Fonda BRAVO, MD;  Location: [facility] INVASIVE CV LAB;  Service: Cardiovascular;  Laterality: Left;   OOPHORECTOMY  11/15/2013   Dr Sherrine- and salpingectomy   ROTATOR CUFF REPAIR     left   Allergies  Allergen Reactions   Rosuvastatin     Other Reaction(s): myalgias (muscle pain)  rosuvastatin   Dulaglutide Nausea Only, Nausea And  Vomiting and Other (See Comments)    Pancreatitis per pt  Trulicity   Oxycodone-Acetaminophen Hives   Statins Other (See Comments)    Stated shutting down organs.    Review of Systems  Musculoskeletal:  Positive for joint pain.     Physical Exam: Nonpalpable pedal pulses.  There is localized edema around the right ankle (effusion) without calor.  There is pain on palpation around the circumference of the right ankle, mostly along the anterior, lateral, and posterior aspects of the ankle.  Medial ankle does  not produce much pain.  No pain with compression of the calf on the right side.  Ankle dorsiflexion does not cause calf pain as well.  The edema does not extend into the forefoot.  The right great toenail has thickened keratinous buildup present but no signs of paronychia or onychocryptosis at this time.  There is no edema to the periungual area.  Epicritic sensation is intact  Radiographic Exam (right ankle, 3 weightbearing views, 12/20/2023):  Normal osseous mineralization. No fractures noted.  There is increase in soft tissue volume around the ankle  Assessment/Plan of Care: 1. Right ankle effusion   2. Acute right ankle pain   3. Localized edema   4. Type II diabetes mellitus with peripheral circulatory disorder (HCC)   5. Pain in right toe(s)      Meds ordered this encounter  Medications   cephALEXin (KEFLEX) 500 MG capsule    Sig: Take 1 capsule (500 mg total) by mouth 3 (three) times daily for 10 days.    Dispense:  30 capsule    Refill:  0   Suzetrigine (JOURNAVX) 50 MG TABS    Sig: Take 50 mg by mouth in the morning and at bedtime for 14 days.    Dispense:  28 tablet    Refill:  0   Discussed findings with patient today.  Upon obtaining the patient's written consent, an attempted aspiration/injection was performed to the right ankle.  Want to evaluate whether this could be a septic joint versus arthritis effusion versus some other differential diagnosis.  0.5 cc of 1% lidocaine  plain were administered to the anterolateral aspect of the right ankle joint.  Following confirmation of anesthesia Betadine prep to the skin, an 18-gauge needle attached to a 3 cc syringe was directed into the anterolateral aspect of the right ankle joint.  A small amount of serous looking exudate was able to be withdrawn into the syringe.  This was removed from her ankle and the needle was discarded.  A cap was placed onto the end of the syringe to be sent for pathology/cytology.  Once leaving the exam room,  small amount of the exudate was expressed onto a Gram stain swab to be sent to [facility] labs for aerobic and anaerobic culture as well.  Antibiotic ointment and a large Band-Aid were applied to the ankle.  She will keep the area covered for at least 24 hours to prevent any bacterial contamination.  Will set patient up for blood work.  Labs were ordered for CBC with differential, C-reactive protein, sed rate, CMP.  For the acute pain that she is experiencing, and a prescription for Journavx 50 mg 1 tablet p.o. twice daily for up to 2 weeks was sent to her pharmacy.  She was given a drug coupon to use for this.  She was also given a prescription for cephalexin 500 mg capsule, 1 capsule 3 times daily x 10 days.  This will help cover if  this could possibly be a septic joint but also address the UTI.  Informed patient that sometimes there could be a septic joint down in the foot or ankle from an infection elsewhere on the body so we want to protect the joint at this time.  Encouraged her to still follow-up with the specialist that she is awaiting the appointment to be evaluated for the possible UTI.  Reviewed findings from the vascular surgeon as well.  Informed her that they feel that she would most likely heal from an arterial standpoint if she had the right great toenail removed, but the stated that other comorbidities such as diabetes could present an issue especially if uncontrolled, with regard to healing.  We will address this at a future visit if she wants to proceed with the toenail avulsion but I do not recommend it today.  She expressed understanding.  Follow-up in 1 to 2 weeks for recheck and review of lab results   Searra Carnathan DSABRA Imperial, DPM, FACFAS [facility]     [address] [place], [place] [address]                Office [phone]  Fax [phone]

## 2023-12-21 ENCOUNTER — Ambulatory Visit: Payer: Self-pay | Admitting: Podiatry

## 2023-12-21 LAB — COMPREHENSIVE METABOLIC PANEL WITH GFR
ALT: 7 IU/L (ref 0–32)
AST: 14 IU/L (ref 0–40)
Albumin: 4 g/dL (ref 3.9–4.9)
Alkaline Phosphatase: 154 IU/L — ABNORMAL HIGH (ref 49–135)
BUN/Creatinine Ratio: 9 — ABNORMAL LOW (ref 12–28)
BUN: 7 mg/dL — ABNORMAL LOW (ref 8–27)
Bilirubin Total: 0.5 mg/dL (ref 0.0–1.2)
CO2: 26 mmol/L (ref 20–29)
Calcium: 9.4 mg/dL (ref 8.7–10.3)
Chloride: 99 mmol/L (ref 96–106)
Creatinine, Ser: 0.76 mg/dL (ref 0.57–1.00)
Globulin, Total: 3.1 g/dL (ref 1.5–4.5)
Glucose: 182 mg/dL — ABNORMAL HIGH (ref 70–99)
Potassium: 4.2 mmol/L (ref 3.5–5.2)
Sodium: 140 mmol/L (ref 134–144)
Total Protein: 7.1 g/dL (ref 6.0–8.5)
eGFR: 87 mL/min/1.73 (ref >59)

## 2023-12-21 LAB — CBC WITH DIFFERENTIAL/PLATELET
Basophils Absolute: 0 x10E3/uL (ref 0.0–0.2)
Basos: 0 %
EOS (ABSOLUTE): 0 x10E3/uL (ref 0.0–0.4)
Eos: 1 %
Hematocrit: 42 % (ref 34.0–46.6)
Hemoglobin: 14 g/dL (ref 11.1–15.9)
Immature Grans (Abs): 0 x10E3/uL (ref 0.0–0.1)
Immature Granulocytes: 0 %
Lymphocytes Absolute: 1 x10E3/uL (ref 0.7–3.1)
Lymphs: 17 %
MCH: 35.4 pg — ABNORMAL HIGH (ref 26.6–33.0)
MCHC: 33.3 g/dL (ref 31.5–35.7)
MCV: 106 fL — ABNORMAL HIGH (ref 79–97)
Monocytes Absolute: 0.7 x10E3/uL (ref 0.1–0.9)
Monocytes: 12 %
Neutrophils Absolute: 4.3 x10E3/uL (ref 1.4–7.0)
Neutrophils: 69 %
Platelets: 213 x10E3/uL (ref 150–450)
RBC: 3.96 x10E6/uL (ref 3.77–5.28)
RDW: 13.2 % (ref 11.7–15.4)
WBC: 6.1 x10E3/uL (ref 3.4–10.8)

## 2023-12-21 LAB — C-REACTIVE PROTEIN: CRP: 17 mg/L — ABNORMAL HIGH (ref 0–10)

## 2023-12-21 LAB — URIC ACID: Uric Acid: 5.6 mg/dL (ref 3.0–7.2)

## 2023-12-21 LAB — SEDIMENTATION RATE: Sed Rate: 39 mm/h (ref 0–40)

## 2023-12-22 ENCOUNTER — Telehealth: Payer: Self-pay | Admitting: Urology

## 2023-12-22 MED ORDER — METHYLPREDNISOLONE 4 MG PO TBPK: ORAL_TABLET | ORAL | 0 refills | Status: DC

## 2023-12-22 NOTE — Telephone Encounter (Signed)
 Pt called saying that the pain hasn't got any better, 7-8/10. Swelling hasn't went down either , says she is keeping it elevated. Pt says she has been taking tylenol for pain but it hasn't eased the pain. She is taking the abx that you prescribed.  She wants to know if there is anything you can send in for pain. She states she can't take percocet - she breaks out in hives. Please Advise, Thanks.  Pharmacy - [id number] [place], [place] - [address]   Call back # [phone]

## 2023-12-22 NOTE — Addendum Note (Signed)
 Addended byBETHA LOEL LAIS D on: 12/22/2023 10:53 AM   Modules accepted: Orders

## 2023-12-25 NOTE — Progress Notes (Signed)
 Spoke with patient, she has an apt with her PCP this week and will talk with them at that time and we will see her back on 10/29

## 2023-12-28 ENCOUNTER — Other Ambulatory Visit: Payer: Self-pay | Admitting: Podiatry

## 2023-12-28 DIAGNOSIS — R600 Localized edema: Secondary | ICD-10-CM

## 2023-12-28 DIAGNOSIS — M25571 Pain in right ankle and joints of right foot: Secondary | ICD-10-CM

## 2023-12-28 DIAGNOSIS — M25471 Effusion, right ankle: Secondary | ICD-10-CM

## 2023-12-28 DIAGNOSIS — E1151 Type 2 diabetes mellitus with diabetic peripheral angiopathy without gangrene: Secondary | ICD-10-CM

## 2023-12-28 NOTE — Progress Notes (Signed)
 Patient called again requesting pain medication.  Offered to send in a Medrol Dosepak and she was advised that this would raise her blood sugar but she declined.  The Journavx was ineffective as well.  Informed the patient that we will not prescribe narcotics for this issue.  She cannot go on NSAIDs due to the aspirin  Plavix combination.  Since her joint aspiration came back negative for any pathology and her blood work was negative, will refer out to orthopedics for evaluation management.

## 2024-01-03 ENCOUNTER — Encounter: Admitting: Podiatry

## 2024-01-03 ENCOUNTER — Telehealth: Payer: Self-pay

## 2024-01-03 NOTE — Progress Notes (Signed)
 Patient did not show for her scheduled appointment this morning.  She had called the office and spoke with one of our team members yesterday and was reminded of our no-show policy.  She wanted to tentatively keep this appointment scheduled for today.  However at the time of her appointment she had not called the office to cancel or reschedule.

## 2024-01-03 NOTE — Telephone Encounter (Signed)
 The patient called regarding her lymphedema diagnosis. She was informed that this clinic does not treat Lymphedema and he was provided, by mail a list of Lymphedema clinics in the area.

## 2024-01-18 ENCOUNTER — Encounter (HOSPITAL_COMMUNITY): Payer: Self-pay

## 2024-01-18 ENCOUNTER — Ambulatory Visit: Admitting: Podiatry

## 2024-01-18 VITALS — BP 160/90 | HR 114 | Temp 101.3°F | Resp 18

## 2024-01-18 DIAGNOSIS — I75021 Atheroembolism of right lower extremity: Secondary | ICD-10-CM | POA: Diagnosis not present

## 2024-01-18 DIAGNOSIS — M79671 Pain in right foot: Secondary | ICD-10-CM | POA: Diagnosis not present

## 2024-01-18 DIAGNOSIS — R110 Nausea: Secondary | ICD-10-CM

## 2024-01-18 NOTE — Progress Notes (Signed)
 Chief Complaint  Patient presents with   Ankle Pain    Right ankle. Worsened a week ago.Pain is primarily in the top of the foot and up into the ankle. Pain is shooting and sharp.There is redness on top of the foot, the right 2nd toe is blackened. The foot is warm to touch. Denies known fever, endorses generalized body aches. Has had nausea but no vomiting.    Discussed the use of AI scribe software for clinical note transcription with the patient, who gave verbal consent to proceed.  History of Present Illness Brandy Hall is a 63 year old female who presents with discoloration of the second toe.  She has experienced discoloration of her second toe for approximately one week without any history of trauma. Initially, there was some perceived improvement, but the condition has since worsened. Two weeks ago, she visited the ER at [facility], where she was discharged without intervention. At that time, the toe appeared better than it does currently.  She experiences nausea but denies vomiting and night sweats. The condition causes significant discomfort and distress.  She is visibly uncomfortable today showing significant signs of distress.     Past Medical History:  Diagnosis Date   Allergic rhinitis    COPD (chronic obstructive pulmonary disease) (HCC)    Diabetes (HCC)    GERD (gastroesophageal reflux disease)    History of colon polyps    Hypercholesteremia    Hypertension    IBS (irritable bowel syndrome)    MI (myocardial infarction) (HCC) 2016   Seasonal allergies    Trigeminal neuralgia    Past Surgical History:  Procedure Laterality Date   ABDOMINAL AORTOGRAM N/A 08/16/2023   Procedure: ABDOMINAL AORTOGRAM;  Surgeon: Lanis Fonda BRAVO, MD;  Location: [facility] INVASIVE CV LAB;  Service: Cardiovascular;  Laterality: N/A;   ABDOMINAL AORTOGRAM N/A 09/06/2023   Procedure: ABDOMINAL AORTOGRAM;  Surgeon: Lanis Fonda BRAVO, MD;  Location: [facility] INVASIVE CV LAB;  Service: Cardiovascular;   Laterality: N/A;   ABDOMINAL HYSTERECTOMY     BREAST CYST ASPIRATION Right    COLONOSCOPY  09/15/2014   [facility]. Multiple small polyps in the transverse and sigmoid colon, not removed today as the patient is on clopidogrel   ELBOW SURGERY     bilat   ESOPHAGEAL DILATION  08/17/2023   Procedure: DILATION, ESOPHAGUS;  Surgeon: Shila Gustav GAILS, MD;  Location: [facility] ENDOSCOPY;  Service: Gastroenterology;;   ESOPHAGOGASTRODUODENOSCOPY  02/06/2014   Small hiatal hernia. Mild gastritis. Gastric polyp status post polypectomy.   ESOPHAGOGASTRODUODENOSCOPY N/A 08/17/2023   Procedure: EGD (ESOPHAGOGASTRODUODENOSCOPY);  Surgeon: Nandigam, Kavitha V, MD;  Location: [facility] ENDOSCOPY;  Service: Gastroenterology;  Laterality: N/A;   GALLBLADDER SURGERY     KNEE SURGERY     left   LAPAROSCOPY  1999   showed endometriosis   LOWER EXTREMITY ANGIOGRAPHY N/A 08/16/2023   Procedure: Lower Extremity Angiography;  Surgeon: Lanis Fonda BRAVO, MD;  Location: [facility] INVASIVE CV LAB;  Service: Cardiovascular;  Laterality: N/A;   LOWER EXTREMITY ANGIOGRAPHY Left 09/06/2023   Procedure: Lower Extremity Angiography;  Surgeon: Lanis Fonda BRAVO, MD;  Location: [facility] INVASIVE CV LAB;  Service: Cardiovascular;  Laterality: Left;   OOPHORECTOMY  11/15/2013   Dr Sherrine- and salpingectomy   ROTATOR CUFF REPAIR     left   Allergies  Allergen Reactions   Crestor [Rosuvastatin] Other (See Comments)    Myalgias    Percocet [Oxycodone-Acetaminophen] Hives   Statins Other (See Comments)    Per patient shuts down  organs   Trulicity [Dulaglutide] Nausea And Vomiting, Nausea Only and Other (See Comments)    Pancreatitis     Physical Exam Oral temperature was 100.6 F at 2:47 PM, and 101.3 F at 3:12 PM. Blood pressure was normal at 2:47 PM, and 160/90 at 3:12 PM. Pulse rate was 116 at 2:47 PM and 114 at 3:12 PM.  EXTREMITIES: Right second toe with black discoloration, warm, non-viable appearance.  Severe pain on palpation of  the right second toe.  Generalized edema to the right lower extremity.  Nonpalpable pedal pulses right foot.   Assessment/Plan of Care: 1. Pain in right foot   2. Nausea   3. Blue toe syndrome, right (HCC)     Assessment & Plan Suspected gangrene of right second toe Discoloration and warmth in the right second toe for one week, with no history of injury. The toe appears non-viable, suggesting compromised blood flow. Emergent evaluation is necessary to assess blood flow and prevent further complications. - Referred to ER for emergent evaluation and management - Recommended IV antibiotics - Recommended MRI to assess blood flow and tissue viability - Coordinated with granddaughter for transportation to ER via EMS from our office.  Nausea Without vomiting or night sweats. No specific etiology identified.  Follow-up after discharge from [facility]   Dionna Wiedemann DSABRA Imperial, DPM, FACFAS [facility]     [address] [place], [place] [address]                Office [phone]  Fax [phone]

## 2024-01-18 NOTE — Patient Instructions (Addendum)
 Patient presented today with severe pain in the right lower extremity.  Patient states the pain is unbearable.  She has been having some issues with ankle effusion for approximately 1 month in the right ankle.  Recent vascular studies from April 2025 show 75 to 99% stenosis in the right iliac artery.  New finding today is the increased swelling spreading into the forefoot with the cyanotic right second toe with extreme pain present.  Denies injury.  Patient with nausea but no vomiting, feeling feverish and has chills.  Vitals:     [place] Flowsheets    01/18/2024   2:47 PM      BP 135/84  Pulse Rate 116  Temp 100.6 F (38.1 C)  Temp Source Oral  SpO2 95 %     Patient encouraged to go to the ED immediately.  She was seen there a couple weeks ago and states that she was discharged but the foot did not look as bad as it does today.  I informed her the toe is beginning to show signs of nonviability.  She did give permission to call 911 and have her transported by ambulance to the nearest emergency room

## 2024-01-19 ENCOUNTER — Other Ambulatory Visit: Payer: Self-pay

## 2024-01-19 ENCOUNTER — Inpatient Hospital Stay (HOSPITAL_COMMUNITY)

## 2024-01-19 ENCOUNTER — Inpatient Hospital Stay (HOSPITAL_COMMUNITY)
Admit: 2024-01-19 | Discharge: 2024-01-24 | DRG: 253 | Disposition: A | Discharge status: Home / Self Care | Source: Other Acute Inpatient Hospital | Attending: Internal Medicine | Admitting: Internal Medicine

## 2024-01-19 DIAGNOSIS — I739 Peripheral vascular disease, unspecified: Secondary | ICD-10-CM

## 2024-01-19 DIAGNOSIS — I77819 Aortic ectasia, unspecified site: Secondary | ICD-10-CM | POA: Diagnosis present

## 2024-01-19 DIAGNOSIS — L538 Other specified erythematous conditions: Secondary | ICD-10-CM

## 2024-01-19 DIAGNOSIS — E876 Hypokalemia: Secondary | ICD-10-CM | POA: Diagnosis present

## 2024-01-19 DIAGNOSIS — I10 Essential (primary) hypertension: Secondary | ICD-10-CM | POA: Diagnosis present

## 2024-01-19 DIAGNOSIS — I700 Atherosclerosis of aorta: Secondary | ICD-10-CM | POA: Diagnosis not present

## 2024-01-19 DIAGNOSIS — Z9889 Other specified postprocedural states: Secondary | ICD-10-CM | POA: Diagnosis not present

## 2024-01-19 DIAGNOSIS — Z90710 Acquired absence of both cervix and uterus: Secondary | ICD-10-CM

## 2024-01-19 DIAGNOSIS — J449 Chronic obstructive pulmonary disease, unspecified: Secondary | ICD-10-CM | POA: Diagnosis present

## 2024-01-19 DIAGNOSIS — I7409 Other arterial embolism and thrombosis of abdominal aorta: Secondary | ICD-10-CM | POA: Diagnosis present

## 2024-01-19 DIAGNOSIS — Z7982 Long term (current) use of aspirin: Secondary | ICD-10-CM

## 2024-01-19 DIAGNOSIS — E7800 Pure hypercholesterolemia, unspecified: Secondary | ICD-10-CM | POA: Diagnosis present

## 2024-01-19 DIAGNOSIS — L03115 Cellulitis of right lower limb: Secondary | ICD-10-CM | POA: Diagnosis present

## 2024-01-19 DIAGNOSIS — I70261 Atherosclerosis of native arteries of extremities with gangrene, right leg: Principal | ICD-10-CM | POA: Diagnosis present

## 2024-01-19 DIAGNOSIS — L03031 Cellulitis of right toe: Principal | ICD-10-CM | POA: Diagnosis present

## 2024-01-19 DIAGNOSIS — Z794 Long term (current) use of insulin: Secondary | ICD-10-CM | POA: Diagnosis not present

## 2024-01-19 DIAGNOSIS — Z7902 Long term (current) use of antithrombotics/antiplatelets: Secondary | ICD-10-CM | POA: Diagnosis not present

## 2024-01-19 DIAGNOSIS — E1140 Type 2 diabetes mellitus with diabetic neuropathy, unspecified: Secondary | ICD-10-CM | POA: Diagnosis present

## 2024-01-19 DIAGNOSIS — Z886 Allergy status to analgesic agent status: Secondary | ICD-10-CM | POA: Diagnosis not present

## 2024-01-19 DIAGNOSIS — I252 Old myocardial infarction: Secondary | ICD-10-CM | POA: Diagnosis not present

## 2024-01-19 DIAGNOSIS — I2510 Atherosclerotic heart disease of native coronary artery without angina pectoris: Secondary | ICD-10-CM | POA: Diagnosis present

## 2024-01-19 DIAGNOSIS — L97519 Non-pressure chronic ulcer of other part of right foot with unspecified severity: Secondary | ICD-10-CM | POA: Diagnosis present

## 2024-01-19 DIAGNOSIS — E1165 Type 2 diabetes mellitus with hyperglycemia: Secondary | ICD-10-CM | POA: Diagnosis present

## 2024-01-19 DIAGNOSIS — M726 Necrotizing fasciitis: Secondary | ICD-10-CM | POA: Diagnosis present

## 2024-01-19 DIAGNOSIS — J189 Pneumonia, unspecified organism: Secondary | ICD-10-CM | POA: Diagnosis present

## 2024-01-19 DIAGNOSIS — Z743 Need for continuous supervision: Secondary | ICD-10-CM | POA: Diagnosis not present

## 2024-01-19 DIAGNOSIS — Z95828 Presence of other vascular implants and grafts: Secondary | ICD-10-CM | POA: Diagnosis not present

## 2024-01-19 DIAGNOSIS — I745 Embolism and thrombosis of iliac artery: Secondary | ICD-10-CM | POA: Diagnosis present

## 2024-01-19 DIAGNOSIS — E11621 Type 2 diabetes mellitus with foot ulcer: Secondary | ICD-10-CM | POA: Diagnosis present

## 2024-01-19 DIAGNOSIS — F17210 Nicotine dependence, cigarettes, uncomplicated: Secondary | ICD-10-CM | POA: Diagnosis present

## 2024-01-19 DIAGNOSIS — Z888 Allergy status to other drugs, medicaments and biological substances status: Secondary | ICD-10-CM

## 2024-01-19 DIAGNOSIS — L0390 Cellulitis, unspecified: Secondary | ICD-10-CM | POA: Diagnosis not present

## 2024-01-19 DIAGNOSIS — I96 Gangrene, not elsewhere classified: Secondary | ICD-10-CM | POA: Diagnosis not present

## 2024-01-19 DIAGNOSIS — Z860100 Personal history of colon polyps, unspecified: Secondary | ICD-10-CM | POA: Diagnosis not present

## 2024-01-19 DIAGNOSIS — E1152 Type 2 diabetes mellitus with diabetic peripheral angiopathy with gangrene: Secondary | ICD-10-CM | POA: Diagnosis present

## 2024-01-19 DIAGNOSIS — I70202 Unspecified atherosclerosis of native arteries of extremities, left leg: Secondary | ICD-10-CM | POA: Diagnosis not present

## 2024-01-19 LAB — COMPREHENSIVE METABOLIC PANEL WITH GFR
ALT: 8 U/L (ref 0–44)
AST: 13 U/L — ABNORMAL LOW (ref 15–41)
Albumin: 2.6 g/dL — ABNORMAL LOW (ref 3.5–5.0)
Alkaline Phosphatase: 78 U/L (ref 38–126)
Anion gap: 12 (ref 5–15)
BUN: 8 mg/dL (ref 8–23)
CO2: 24 mmol/L (ref 22–32)
Calcium: 8.6 mg/dL — ABNORMAL LOW (ref 8.9–10.3)
Chloride: 94 mmol/L — ABNORMAL LOW (ref 98–111)
Creatinine, Ser: 0.82 mg/dL (ref 0.44–1.00)
GFR, Estimated: >60 mL/min (ref >60)
Glucose, Bld: 432 mg/dL — ABNORMAL HIGH (ref 70–99)
Potassium: 3.4 mmol/L — ABNORMAL LOW (ref 3.5–5.1)
Sodium: 130 mmol/L — ABNORMAL LOW (ref 135–145)
Total Bilirubin: 0.6 mg/dL (ref 0.0–1.2)
Total Protein: 6.8 g/dL (ref 6.5–8.1)

## 2024-01-19 LAB — GLUCOSE, CAPILLARY
Glucose-Capillary: 424 mg/dL — ABNORMAL HIGH (ref 70–99)
Glucose-Capillary: 427 mg/dL — ABNORMAL HIGH (ref 70–99)
Glucose-Capillary: 329 mg/dL — ABNORMAL HIGH (ref 70–99)
Glucose-Capillary: 201 mg/dL — ABNORMAL HIGH (ref 70–99)
Glucose-Capillary: 384 mg/dL — ABNORMAL HIGH (ref 70–99)

## 2024-01-19 LAB — CBC
HCT: 39.2 % (ref 36.0–46.0)
Hemoglobin: 13.4 g/dL (ref 12.0–15.0)
MCH: 34.4 pg — ABNORMAL HIGH (ref 26.0–34.0)
MCHC: 34.2 g/dL (ref 30.0–36.0)
MCV: 100.8 fL — ABNORMAL HIGH (ref 80.0–100.0)
Platelets: 266 K/uL (ref 150–400)
RBC: 3.89 MIL/uL (ref 3.87–5.11)
RDW: 12.5 % (ref 11.5–15.5)
WBC: 7.6 K/uL (ref 4.0–10.5)
nRBC: 0 % (ref 0.0–0.2)

## 2024-01-19 LAB — VAS US ABI WITH/WO TBI
Left ABI: 0.84
Right ABI: 0.42

## 2024-01-19 LAB — MAGNESIUM: Magnesium: 1.6 mg/dL — ABNORMAL LOW (ref 1.7–2.4)

## 2024-01-19 LAB — HEMOGLOBIN A1C
Hgb A1c MFr Bld: 8.7 % — ABNORMAL HIGH (ref 4.8–5.6)
Mean Plasma Glucose: 202.99 mg/dL

## 2024-01-19 LAB — SEDIMENTATION RATE: Sed Rate: 62 mm/h — ABNORMAL HIGH (ref 0–22)

## 2024-01-19 LAB — C-REACTIVE PROTEIN: CRP: 9.8 mg/dL — ABNORMAL HIGH (ref <1.0)

## 2024-01-19 MED ORDER — MORPHINE SULFATE (PF) 2 MG/ML IV SOLN
2.0000 mg | Freq: Once | INTRAVENOUS | Status: AC
  Administered 2024-01-19: 2 mg via INTRAVENOUS
  Filled 2024-01-19: qty 1

## 2024-01-19 MED ORDER — ENOXAPARIN SODIUM 40 MG/0.4ML IJ SOSY
40.0000 mg | PREFILLED_SYRINGE | INTRAMUSCULAR | Status: DC
  Administered 2024-01-19 – 2024-01-22 (×4): 40 mg via SUBCUTANEOUS
  Filled 2024-01-19 (×4): qty 0.4

## 2024-01-19 MED ORDER — PIPERACILLIN-TAZOBACTAM 3.375 G IVPB
3.3750 g | Freq: Three times a day (TID) | INTRAVENOUS | Status: DC
  Administered 2024-01-19 – 2024-01-24 (×16): 3.375 g via INTRAVENOUS
  Filled 2024-01-19 (×18): qty 50

## 2024-01-19 MED ORDER — PROCHLORPERAZINE EDISYLATE 10 MG/2ML IJ SOLN: 5.0000 mg | Freq: Four times a day (QID) | INTRAMUSCULAR | Status: DC | PRN

## 2024-01-19 MED ORDER — CLOPIDOGREL BISULFATE 75 MG PO TABS
75.0000 mg | ORAL_TABLET | Freq: Every day | ORAL | Status: DC
  Administered 2024-01-19 – 2024-01-24 (×6): 75 mg via ORAL
  Filled 2024-01-19 (×6): qty 1

## 2024-01-19 MED ORDER — HYDROMORPHONE HCL 1 MG/ML IJ SOLN
0.5000 mg | INTRAMUSCULAR | Status: DC | PRN
  Administered 2024-01-19 – 2024-01-24 (×10): 0.5 mg via INTRAVENOUS
  Filled 2024-01-19 (×11): qty 0.5

## 2024-01-19 MED ORDER — INSULIN ASPART 100 UNIT/ML IJ SOLN
0.0000 [IU] | Freq: Three times a day (TID) | INTRAMUSCULAR | Status: DC
  Administered 2024-01-19: 5 [IU] via SUBCUTANEOUS
  Administered 2024-01-19: 11 [IU] via SUBCUTANEOUS
  Administered 2024-01-20: 5 [IU] via SUBCUTANEOUS
  Administered 2024-01-20: 3 [IU] via SUBCUTANEOUS
  Administered 2024-01-20: 2 [IU] via SUBCUTANEOUS
  Administered 2024-01-21 (×2): 5 [IU] via SUBCUTANEOUS
  Administered 2024-01-22: 8 [IU] via SUBCUTANEOUS
  Administered 2024-01-22: 5 [IU] via SUBCUTANEOUS
  Administered 2024-01-23: 8 [IU] via SUBCUTANEOUS
  Filled 2024-01-19: qty 5
  Filled 2024-01-19: qty 2
  Filled 2024-01-19: qty 8
  Filled 2024-01-19: qty 5
  Filled 2024-01-19: qty 15
  Filled 2024-01-19: qty 8
  Filled 2024-01-19 (×2): qty 5
  Filled 2024-01-19: qty 2
  Filled 2024-01-19: qty 5

## 2024-01-19 MED ORDER — PREGABALIN 25 MG PO CAPS
50.0000 mg | ORAL_CAPSULE | Freq: Three times a day (TID) | ORAL | Status: DC
  Administered 2024-01-19 – 2024-01-24 (×12): 50 mg via ORAL
  Filled 2024-01-19 (×14): qty 2

## 2024-01-19 MED ORDER — PANTOPRAZOLE SODIUM 40 MG PO TBEC: 40.0000 mg | DELAYED_RELEASE_TABLET | Freq: Two times a day (BID) | ORAL | Status: DC

## 2024-01-19 MED ORDER — RANOLAZINE ER 500 MG PO TB12
500.0000 mg | ORAL_TABLET | Freq: Two times a day (BID) | ORAL | Status: DC
  Administered 2024-01-19 – 2024-01-24 (×11): 500 mg via ORAL
  Filled 2024-01-19 (×12): qty 1

## 2024-01-19 MED ORDER — INSULIN ASPART 100 UNIT/ML IJ SOLN
10.0000 [IU] | Freq: Once | INTRAMUSCULAR | Status: AC
  Administered 2024-01-19: 10 [IU] via SUBCUTANEOUS
  Filled 2024-01-19: qty 10

## 2024-01-19 MED ORDER — SODIUM CHLORIDE 0.9% FLUSH
3.0000 mL | Freq: Two times a day (BID) | INTRAVENOUS | Status: DC
  Administered 2024-01-19 – 2024-01-24 (×9): 3 mL via INTRAVENOUS

## 2024-01-19 MED ORDER — FENTANYL CITRATE (PF) 50 MCG/ML IJ SOSY: 25.0000 ug | PREFILLED_SYRINGE | INTRAMUSCULAR | Status: DC | PRN

## 2024-01-19 MED ORDER — INSULIN ASPART 100 UNIT/ML IJ SOLN: 0.0000 [IU] | INTRAMUSCULAR | Status: DC

## 2024-01-19 MED ORDER — VANCOMYCIN HCL 1500 MG/300ML IV SOLN
1500.0000 mg | Freq: Once | INTRAVENOUS | Status: AC
  Administered 2024-01-19: 1500 mg via INTRAVENOUS
  Filled 2024-01-19: qty 300

## 2024-01-19 MED ORDER — EZETIMIBE 10 MG PO TABS
10.0000 mg | ORAL_TABLET | Freq: Every day | ORAL | Status: DC
  Administered 2024-01-19 – 2024-01-24 (×6): 10 mg via ORAL
  Filled 2024-01-19 (×6): qty 1

## 2024-01-19 MED ORDER — ASPIRIN 81 MG PO CHEW
81.0000 mg | CHEWABLE_TABLET | Freq: Every day | ORAL | Status: DC
  Administered 2024-01-19 – 2024-01-24 (×6): 81 mg via ORAL
  Filled 2024-01-19 (×7): qty 1

## 2024-01-19 MED ORDER — ISOSORBIDE MONONITRATE ER 30 MG PO TB24
30.0000 mg | ORAL_TABLET | Freq: Two times a day (BID) | ORAL | Status: DC
  Administered 2024-01-19 – 2024-01-24 (×11): 30 mg via ORAL
  Filled 2024-01-19 (×11): qty 1

## 2024-01-19 MED ORDER — IPRATROPIUM-ALBUTEROL 0.5-2.5 (3) MG/3ML IN SOLN: 3.0000 mL | Freq: Four times a day (QID) | RESPIRATORY_TRACT | Status: DC | PRN

## 2024-01-19 MED ORDER — INSULIN GLARGINE-YFGN 100 UNIT/ML ~~LOC~~ SOLN
24.0000 [IU] | Freq: Every day | SUBCUTANEOUS | Status: DC
  Administered 2024-01-19 – 2024-01-24 (×5): 24 [IU] via SUBCUTANEOUS
  Filled 2024-01-19 (×7): qty 0.24

## 2024-01-19 MED ORDER — METOPROLOL TARTRATE 25 MG PO TABS
25.0000 mg | ORAL_TABLET | Freq: Two times a day (BID) | ORAL | Status: DC
  Administered 2024-01-19 – 2024-01-24 (×11): 25 mg via ORAL
  Filled 2024-01-19 (×11): qty 1

## 2024-01-19 MED ORDER — HYDRALAZINE HCL 25 MG PO TABS: 25.0000 mg | ORAL_TABLET | Freq: Four times a day (QID) | ORAL | Status: DC | PRN

## 2024-01-19 MED ORDER — HYDROMORPHONE HCL 2 MG PO TABS
1.0000 mg | ORAL_TABLET | ORAL | Status: DC | PRN
  Administered 2024-01-19 – 2024-01-23 (×8): 1 mg via ORAL
  Filled 2024-01-19 (×10): qty 1

## 2024-01-19 MED ORDER — ACETAMINOPHEN 325 MG PO TABS
650.0000 mg | ORAL_TABLET | Freq: Four times a day (QID) | ORAL | Status: DC | PRN
  Administered 2024-01-22: 650 mg via ORAL
  Filled 2024-01-19: qty 2

## 2024-01-19 MED ORDER — AMLODIPINE BESYLATE 5 MG PO TABS
5.0000 mg | ORAL_TABLET | Freq: Every day | ORAL | Status: DC
  Administered 2024-01-19 – 2024-01-24 (×5): 5 mg via ORAL
  Filled 2024-01-19 (×6): qty 1

## 2024-01-19 MED ORDER — INSULIN ASPART 100 UNIT/ML IJ SOLN
0.0000 [IU] | Freq: Every day | INTRAMUSCULAR | Status: DC
  Administered 2024-01-19: 5 [IU] via SUBCUTANEOUS
  Filled 2024-01-19: qty 5

## 2024-01-19 MED ORDER — LISINOPRIL 10 MG PO TABS
10.0000 mg | ORAL_TABLET | Freq: Every day | ORAL | Status: DC
  Administered 2024-01-19 – 2024-01-24 (×5): 10 mg via ORAL
  Filled 2024-01-19 (×6): qty 1

## 2024-01-19 MED ORDER — BUDESON-GLYCOPYRROL-FORMOTEROL 160-9-4.8 MCG/ACT IN AERO
2.0000 | INHALATION_SPRAY | Freq: Two times a day (BID) | RESPIRATORY_TRACT | Status: DC
  Administered 2024-01-19 – 2024-01-24 (×8): 2 via RESPIRATORY_TRACT
  Filled 2024-01-19 (×2): qty 5.9

## 2024-01-19 MED ORDER — VANCOMYCIN HCL 750 MG/150ML IV SOLN
750.0000 mg | Freq: Two times a day (BID) | INTRAVENOUS | Status: DC
  Administered 2024-01-20 – 2024-01-24 (×9): 750 mg via INTRAVENOUS
  Filled 2024-01-19 (×10): qty 150

## 2024-01-19 NOTE — Progress Notes (Signed)
 Right lower ext arterial duplex and ABI completed. Ricka Elder Molt, RDMS, RVT

## 2024-01-19 NOTE — Consult Note (Addendum)
 Hospital Consult    Reason for Consult:  gangrene of right 2nd toe Requesting Physician:  Dr. Malvin MRN #:  [id number]  History of Present Illness: This is a 63 y.o. female with past medical history significant for COPD, Type II DM, GERD, HTN, HLD, CAD, and PAD presenting with worsening erythema and pain in her right foot and worsening necrotic changes in the 2nd toe. She was sent to ED at recommendation of Podiatrist with concerns of progression of her wound.  She is familiar to our service having undergone recent Angiogram in July by Dr. Lanis for lifestyle limiting claudication. At that time she was found to have ectatic aorta and iliacs with 80% stenosis of CIA bilaterally, otherwise patent inflow with high take off of her AT, three vessel runoff to the foot with small vessel disease in the foot. DP occluded proximally. PT filling the plantar arteries. No intervention performed. She was scheduled to have CTA follow up but did not show for her study.   Currently she is experiencing a lot of pain in her right foot. She says the pain initially started about 2 weeks ago. She has been using walker to ambulate since. However she says last week it became much worse and she noticed the redness in her foot and darkening of her 2nd toe. Prior to this she denies any pain on ambulation or rest. She has not had any issues with wound healing. She has been started on IV antibiotics.   Past Medical History:  Diagnosis Date   Allergic rhinitis    COPD (chronic obstructive pulmonary disease) (HCC)    Diabetes (HCC)    GERD (gastroesophageal reflux disease)    History of colon polyps    Hypercholesteremia    Hypertension    IBS (irritable bowel syndrome)    MI (myocardial infarction) (HCC) 2016   Seasonal allergies    Trigeminal neuralgia     Past Surgical History:  Procedure Laterality Date   ABDOMINAL AORTOGRAM N/A 08/16/2023   Procedure: ABDOMINAL AORTOGRAM;  Surgeon: Lanis Fonda BRAVO, MD;   Location[facility] INVASIVE CV LAB;  Service: Cardiovascular;  Laterality: N/A;   ABDOMINAL AORTOGRAM N/A 09/06/2023   Procedure: ABDOMINAL AORTOGRAM;  Surgeon: Lanis Fonda BRAVO, MD;  Location: [facility] INVASIVE CV LAB;  Service: Cardiovascular;  Laterality: N/A;   ABDOMINAL HYSTERECTOMY     BREAST CYST ASPIRATION Right    COLONOSCOPY  09/15/2014   [facility]. Multiple small polyps in the transverse and sigmoid colon, not removed today as the patient is on clopidogrel   ELBOW SURGERY     bilat   ESOPHAGEAL DILATION  08/17/2023   Procedure: DILATION, ESOPHAGUS;  Surgeon: Shila Gustav GAILS, MD;  Location: [facility] ENDOSCOPY;  Service: Gastroenterology;;   ESOPHAGOGASTRODUODENOSCOPY  02/06/2014   Small hiatal hernia. Mild gastritis. Gastric polyp status post polypectomy.   ESOPHAGOGASTRODUODENOSCOPY N/A 08/17/2023   Procedure: EGD (ESOPHAGOGASTRODUODENOSCOPY);  Surgeon: Nandigam, Kavitha V, MD;  Location[facility] ENDOSCOPY;  Service: Gastroenterology;  Laterality: N/A;   GALLBLADDER SURGERY     KNEE SURGERY     left   LAPAROSCOPY  1999   showed endometriosis   LOWER EXTREMITY ANGIOGRAPHY N/A 08/16/2023   Procedure: Lower Extremity Angiography;  Surgeon: Lanis Fonda BRAVO, MD;  Location: [facility] INVASIVE CV LAB;  Service: Cardiovascular;  Laterality: N/A;   LOWER EXTREMITY ANGIOGRAPHY Left 09/06/2023   Procedure: Lower Extremity Angiography;  Surgeon: Lanis Fonda BRAVO, MD;  Location: [facility] INVASIVE CV LAB;  Service: Cardiovascular;  Laterality: Left;   OOPHORECTOMY  11/15/2013   Dr Sherrine- and salpingectomy   ROTATOR CUFF REPAIR     left    Allergies  Allergen Reactions   Rosuvastatin     Other Reaction(s): myalgias (muscle pain)  rosuvastatin   Dulaglutide Nausea Only, Nausea And Vomiting and Other (See Comments)    Pancreatitis per pt  Trulicity   Oxycodone-Acetaminophen Hives   Statins Other (See Comments)    Stated shutting down organs.     Prior to Admission medications   Medication Sig Start Date End  Date Taking? Authorizing Provider  albuterol  (PROVENTIL  HFA;VENTOLIN  HFA) 108 (90 BASE) MCG/ACT inhaler Inhale 2 puffs into the lungs every 6 (six) hours as needed for wheezing or shortness of breath.    [provider]  amLODipine  (NORVASC ) 5 MG tablet Take 5 mg by mouth.    [provider]  ASPIRIN  81 PO 81 mg once a day    [provider]  cholecalciferol  (VITAMIN D ) 1000 UNITS tablet Take 1,000 Units by mouth daily.    [provider]  clopidogrel (PLAVIX) 75 MG tablet Take 75 mg by mouth.    [provider]  ezetimibe  (ZETIA ) 10 MG tablet Take 10 mg by mouth daily. 06/28/23   [provider]  Fluticasone-Umeclidin-Vilant (TRELEGY ELLIPTA) 200-62.5-25 MCG/ACT AEPB Inhale 1 puff into the lungs daily in the afternoon.    [provider]  Insulin  Aspart FlexPen (NOVOLOG ) 100 UNIT/ML Inject 0-2 Units into the skin See admin instructions. 08/14/23   [provider]  isosorbide  mononitrate (IMDUR ) 30 MG 24 hr tablet Take 30 mg by mouth 2 (two) times daily.     [provider]  LANTUS SOLOSTAR 100 UNIT/ML Solostar Pen 24 units sq once a day with meals 07/21/15   [provider]  lisinopril  (ZESTRIL ) 5 MG tablet Take 10 mg by mouth daily. 07/17/23   [provider]  metoprolol  tartrate (LOPRESSOR ) 25 MG tablet Take 25 mg by mouth 2 (two) times daily.    [provider]  montelukast  (SINGULAIR ) 10 MG tablet Take 1 tablet by mouth at bedtime.    [provider]  Multiple Vitamin (MULTIVITAMIN) capsule Take 1 capsule by mouth daily.    [provider]  nitroGLYCERIN  (NITROSTAT ) 0.4 MG SL tablet Place 0.4 mg under the tongue every 5 (five) minutes as needed for chest pain.    [provider]  pantoprazole  (PROTONIX ) 40 MG tablet Take 1 tablet (40 mg total) by mouth 2 (two) times daily before a meal. 08/17/23   Tawkaliyar, Roya, DO  potassium chloride  (KLOR-CON ) 10 MEQ tablet Take  10 mEq by mouth daily.    [provider]  RANEXA 500 MG 12 hr tablet Take 500 mg by mouth 2 (two) times daily. 08/08/23   [provider]    Social History   Socioeconomic History   Marital status: Single    Spouse name: Not on file   Number of children: Not on file   Years of education: Not on file   Highest education level: Not on file  Occupational History   Not on file  Tobacco Use   Smoking status: Every Day    Current packs/day: 0.50    Average packs/day: 0.5 packs/day for 25.0 years (12.5 ttl pk-yrs)    Types: Cigarettes   Smokeless tobacco: Never  Vaping Use   Vaping status: Never Used  Substance and Sexual Activity   Alcohol use: No   Drug use: Never   Sexual activity: Not  on file  Other Topics Concern   Not on file  Social History Narrative   Single Lives with grandchild.   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Drivers of Corporate Investment Banker Strain: Not on [employer] Insecurity: No Food Insecurity (01/19/2024)   Hunger Vital Sign    Worried About Running Out of Food in the Last Year: Never true    Ran Out of Food in the Last Year: Never true  Transportation Needs: No Transportation Needs (01/19/2024)   PRAPARE - Administrator, Civil Service (Medical): No    Lack of Transportation (Non-Medical): No  Physical Activity: Not on file  Stress: Not on file  Social Connections: Unknown (01/19/2024)   Social Connection and Isolation Panel    Frequency of Communication with Friends and Family: Twice a week    Frequency of Social Gatherings with Friends and Family: Once a week    Attends Religious Services: Patient declined    Database Administrator or Organizations: Patient declined    Attends Banker Meetings: Patient declined    Marital Status: Never married  Intimate Partner Violence: Not At Risk (01/19/2024)   Humiliation, Afraid, Rape, and Kick questionnaire    Fear of Current or Ex-Partner: No    Emotionally Abused: No     Physically Abused: No    Sexually Abused: No     No family history on file.  ROS: Otherwise negative unless mentioned in HPI  Physical Examination  Vitals:   01/19/24 0602 01/19/24 0848  BP: (!) 166/106 (!) 116/90  Pulse:  95  Resp:  16  Temp:  98.4 F (36.9 C)  SpO2:  97%   Body mass index is 26.88 kg/m.  General:  WDWN in NAD Gait: Not observed HENT: WNL, normocephalic Pulmonary: normal non-labored breathing Cardiac: regular Abdomen: soft Vascular Exam/Pulses: faint right femoral pulse, 2+ left femoral pulse. Monophasic doppler Dp/ Pt/ Pero signals Extremities: with ischemic changes of 2nd and 2nd toes, with dry Gangrene of right 2nd toe, with cellulitis of right foot; without open wounds Musculoskeletal: no muscle wasting or atrophy  Neurologic: A&O X 3;  No focal weakness or paresthesias are detected; speech is fluent/normal Psychiatric:  The pt has Normal affect. Lymph:  Unremarkable  CBC    Component Value Date/Time   WBC 7.6 01/19/2024 0648   RBC 3.89 01/19/2024 0648   HGB 13.4 01/19/2024 0648   HGB 14.0 12/20/2023 1104   HCT 39.2 01/19/2024 0648   HCT 42.0 12/20/2023 1104   PLT 266 01/19/2024 0648   PLT 213 12/20/2023 1104   MCV 100.8 (H) 01/19/2024 0648   MCV 106 (H) 12/20/2023 1104   MCH 34.4 (H) 01/19/2024 0648   MCHC 34.2 01/19/2024 0648   RDW 12.5 01/19/2024 0648   RDW 13.2 12/20/2023 1104   LYMPHSABS 1.0 12/20/2023 1104   MONOABS 0.7 08/16/2023 0952   EOSABS 0.0 12/20/2023 1104   BASOSABS 0.0 12/20/2023 1104    BMET    Component Value Date/Time   NA 130 (L) 01/19/2024 0648   NA 140 12/20/2023 1104   K 3.4 (L) 01/19/2024 0648   CL 94 (L) 01/19/2024 0648   CO2 24 01/19/2024 0648   GLUCOSE 432 (H) 01/19/2024 0648   BUN 8 01/19/2024 0648   BUN 7 (L) 12/20/2023 1104   CREATININE 0.82 01/19/2024 0648   CALCIUM 8.6 (L) 01/19/2024 0648   GFRNONAA >60 01/19/2024 0648    COAGS: No results found for: INR, PROTIME  Non-Invasive  Vascular Imaging:   ABI and RLE arterial duplex pending  Statin:  No. Intolerance Beta Blocker:  Yes.   Aspirin :  Yes.   ACEI:  Yes.   ARB:  No. CCB use:  Yes Other antiplatelets/anticoagulants:  Yes.   Plavix   ASSESSMENT/PLAN: This is a 63 y.o. female with past medical history significant for COPD, Type II DM, GERD, HTN, HLD, CAD, and PAD presenting with worsening erythema and pain in her right foot and worsening necrotic changes in the 2nd toe. 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 On exam she has weak right femoral pulse with monophasic DP/PT/ Pero doppler signals.  - continue current Antibiotic regimen - Okay to continue Aspirin  and Plavix - Right lower extremity arterial duplex and ABI ordered and are pending - Placed order for CTA abd/pelvis with runoff to evaluate her distal aorta and iliac arteries -Recommend repeat Angiogram to evaluate her RLE perfusion. I reviewed risks/ benefits of procedure including bleeding, arterial injury or dissection, thrombosis, or need for surgical intervention. She is agreeable to proceed - Our earliest availability is in the cath lab on Monday 11/17 with Dr. Sheree Teretha Damme PA-C [facility] [phone] 01/19/2024  8:50 AM  I have independently interviewed and examined patient and agree with PA assessment and plan above.  She has known symptomatic common iliac artery stenosis bilaterally, ABIs significantly decreased from last evaluation in April.  Previous plan was to obtain CT angio of the abdomen and pelvis to accurately evaluate the ectatic infrarenal aorta and common iliac arteries and possibly place stents.  Will plan to proceed with CTA possibly stenting of the bilateral common iliac arteries with possible concomitant treatment of ectatic aorta.  This was discussed with the patient she demonstrates good understanding.  Izaac Reisig C. Sheree, MD [facility] [place] Office:  [phone] Pager: [phone]

## 2024-01-19 NOTE — Progress Notes (Signed)
 Pt. Has elevated Bp 137/106 and blood glucose 427, the nurse reached out to admissions no orders at this time

## 2024-01-19 NOTE — Progress Notes (Signed)
 Pt. Arrived to unit A&O x4, c/o pain to RLE 5-10, noted 2nd toe to right foot gangrene, warm to touch audibile faint pulse with doppler.Admissions notified of arrival

## 2024-01-19 NOTE — Consult Note (Signed)
 PODIATRY CONSULTATION  NAME Brandy Hall MRN [id number] DOB 1960-11-01 DOA 01/19/2024   Reason for consult: Necrotic right second toe  Attending/Consulting physician: A. Alto NP  History of present illness: 63 y.o. female with history DM2, PAD status post prior angiogram in July 2025 presented with concern for erythema and pain in the right foot with worsening necrotic changes to the right second toe.  Patient states this is progressively got worse over about the last week.  Patient was transferred from [facility].  She was brought there from office visit with Dr. Awanda from [facility] in [place] via EMS for vital abnormalities and concern for infection.  She reports significant pain in the right foot.  Past Medical History:  Diagnosis Date   Allergic rhinitis    COPD (chronic obstructive pulmonary disease) (HCC)    Diabetes (HCC)    GERD (gastroesophageal reflux disease)    History of colon polyps    Hypercholesteremia    Hypertension    IBS (irritable bowel syndrome)    MI (myocardial infarction) (HCC) 2016   Seasonal allergies    Trigeminal neuralgia        Latest Ref Rng & Units 01/19/2024    6:48 AM 12/20/2023   11:04 AM 09/06/2023    7:50 AM  CBC  WBC 4.0 - 10.5 K/uL 7.6  6.1    Hemoglobin 12.0 - 15.0 g/dL 86.5  85.9  84.3   Hematocrit 36.0 - 46.0 % 39.2  42.0  46.0   Platelets 150 - 400 K/uL 266  213         Latest Ref Rng & Units 12/20/2023   11:04 AM 09/06/2023    7:50 AM 08/17/2023    7:01 AM  BMP  Glucose 70 - 99 mg/dL 817  91  852   BUN 8 - 27 mg/dL 7  11  7    Creatinine 0.57 - 1.00 mg/dL 9.23  9.19  9.14   BUN/Creat Ratio 12 - 28 9     Sodium 134 - 144 mmol/L 140  137  134   Potassium 3.5 - 5.2 mmol/L 4.2  4.8  3.9   Chloride 96 - 106 mmol/L 99  102  103   CO2 20 - 29 mmol/L 26   18   Calcium 8.7 - 10.3 mg/dL 9.4   9.1       Physical Exam: Lower Extremity Exam  Right foot nonpalpable DP PT pulses  Edema right  foot  Erythema that is streaking up towards the mid lower leg anteriorly  Significantly tender to palpation to the right forefoot  Dry gangrenous changes to the distal aspect of the second toe on the right foot         ASSESSMENT/PLAN OF CARE 63 y.o. female with PMHx significant for DM2 and PAD with cellulitis of the right foot and gangrene of the right second toe.  A1c 8.7 XR right foot pending ESR 39, CRP 17  on 10/15  -Discussed with patient concern for ischemic process in the right lower extremity causing worsening gangrene right second toe as well as possible ischemic pain.  Discussed with vascular for consult.  Discussed possible need for right second toe amputation at some point in the near future.   - Recommend starting IV abx broad spectrum pending further culture data - Anticoagulation: Okay to continue - Wound care: Betadine paint to the distal second toe - WB status: Weightbearing as tolerated right foot - Will continue  to follow   Thank you for the consult.  Please contact me directly with any questions or concerns.           [name], DPM [facility]    [address] [place], [place] [address]                Office [phone]  Fax [phone]

## 2024-01-19 NOTE — Progress Notes (Signed)
 Pharmacy Antibiotic Note  Brandy Hall is a 63 y.o. female admitted on 01/19/2024 with necrotic right second toe.  Pharmacy has been consulted for vancomycin and Zosyn dosing.  Renal function is normal, febrile with Tm 101.3, and WBC are normal. VVS and Podiatry consulted for management.  Plan: Vancomycin 1500 mg IV load then 750 mg IV q12h Goal AUC 400-550, eAUC 421.2, SCr used 0.82, Vd 0.72  Zosyn 3.375 g IV q8h to be infused over 4 hours Monitor renal function, clinical progress, cultures/sensitivities F/U LOT and de-escalate as able Vancomycin levels as clinically indicated   Height: 5' 8 (172.7 cm) Weight: 80.2 kg (176 lb 12.9 oz) IBW/kg (Calculated) : 63.9  Temp (24hrs), Avg:100 F (37.8 C), Min:98.4 F (36.9 C), Max:101.3 F (38.5 C)  Recent Labs  Lab 01/19/24 0648  WBC 7.6  CREATININE 0.82    Estimated Creatinine Clearance: 76 mL/min (by C-G formula based on SCr of 0.82 mg/dL).    Allergies  Allergen Reactions   Rosuvastatin     Other Reaction(s): myalgias (muscle pain)  rosuvastatin   Dulaglutide Nausea Only, Nausea And Vomiting and Other (See Comments)    Pancreatitis per pt  Trulicity   Oxycodone-Acetaminophen Hives   Statins Other (See Comments)    Stated shutting down organs.     Thank you for involving pharmacy in this patient's care.  Delon Sax, PharmD, BCPS Clinical Pharmacist Clinical phone for 01/19/2024 is [phone] 01/19/2024 10:07 AM

## 2024-01-19 NOTE — H&P (Signed)
 History and Physical    Patient: Brandy Hall FMW:[id number] DOB: [birth year] DOA: 01/19/2024 DOS: the patient was seen and examined on 01/19/2024 PCP: Marelyn Quill, MD  Patient coming from: Home  Chief Complaint: Cellulitis right foot and toe  HPI: Brandy Hall is a 63 y.o. female with medical history significant of diabetes mellitus 2, CAD with prior MI, peripheral vascular disease, COPD, ongoing tobacco abuse, hypertension, and dyslipidemia.  Patient has been followed in the outpatient setting by her primary podiatrist Dr. Loel in [place].  She has been having pain for about 2 weeks but in the past 5 to 7 days the pain had gotten worse with color changes to her foot.  She noticed that the second toe was more dark in color and had intensified pain.  She described the changes to me as sudden.  Because of severe pain she has been unable to walk.  She describes the pain as a constant burning pain typical for her underlying peripheral neuropathy but then sharp lightninglike pains primarily localized to the toe.  She was sent to [facility] to be admitted for further evaluation and treatment by the hospitalist team.  Review of Systems: As mentioned in the history of present illness. All other systems reviewed and are negative.  Past Medical History:  Diagnosis Date   Allergic rhinitis    COPD (chronic obstructive pulmonary disease) (HCC)    Diabetes (HCC)    GERD (gastroesophageal reflux disease)    History of colon polyps    Hypercholesteremia    Hypertension    IBS (irritable bowel syndrome)    MI (myocardial infarction) (HCC) 2016   Seasonal allergies    Trigeminal neuralgia    Past Surgical History:  Procedure Laterality Date   ABDOMINAL AORTOGRAM N/A 08/16/2023   Procedure: ABDOMINAL AORTOGRAM;  Surgeon: Lanis Fonda BRAVO, MD;  Location: [facility] INVASIVE CV LAB;  Service: Cardiovascular;  Laterality: N/A;   ABDOMINAL AORTOGRAM N/A 09/06/2023   Procedure: ABDOMINAL  AORTOGRAM;  Surgeon: Lanis Fonda BRAVO, MD;  Location: [facility] INVASIVE CV LAB;  Service: Cardiovascular;  Laterality: N/A;   ABDOMINAL HYSTERECTOMY     BREAST CYST ASPIRATION Right    COLONOSCOPY  09/15/2014   [facility]. Multiple small polyps in the transverse and sigmoid colon, not removed today as the patient is on clopidogrel   ELBOW SURGERY     bilat   ESOPHAGEAL DILATION  08/17/2023   Procedure: DILATION, ESOPHAGUS;  Surgeon: Shila Gustav GAILS, MD;  Location: [facility] ENDOSCOPY;  Service: Gastroenterology;;   ESOPHAGOGASTRODUODENOSCOPY  02/06/2014   Small hiatal hernia. Mild gastritis. Gastric polyp status post polypectomy.   ESOPHAGOGASTRODUODENOSCOPY N/A 08/17/2023   Procedure: EGD (ESOPHAGOGASTRODUODENOSCOPY);  Surgeon: Nandigam, Kavitha V, MD;  Location: [facility] ENDOSCOPY;  Service: Gastroenterology;  Laterality: N/A;   GALLBLADDER SURGERY     KNEE SURGERY     left   LAPAROSCOPY  1999   showed endometriosis   LOWER EXTREMITY ANGIOGRAPHY N/A 08/16/2023   Procedure: Lower Extremity Angiography;  Surgeon: Lanis Fonda BRAVO, MD;  Location: [facility] INVASIVE CV LAB;  Service: Cardiovascular;  Laterality: N/A;   LOWER EXTREMITY ANGIOGRAPHY Left 09/06/2023   Procedure: Lower Extremity Angiography;  Surgeon: Lanis Fonda BRAVO, MD;  Location: [facility] INVASIVE CV LAB;  Service: Cardiovascular;  Laterality: Left;   OOPHORECTOMY  11/15/2013   Dr Sherrine- and salpingectomy   ROTATOR CUFF REPAIR     left   Social History:  reports that she has been smoking cigarettes. She has a 12.5 pack-year  smoking history. She has never used smokeless tobacco. She reports that she does not drink alcohol and does not use drugs.  Allergies  Allergen Reactions   Rosuvastatin     Other Reaction(s): myalgias (muscle pain)  rosuvastatin   Dulaglutide Nausea Only, Nausea And Vomiting and Other (See Comments)    Pancreatitis per pt  Trulicity   Oxycodone-Acetaminophen Hives   Statins Other (See Comments)    Stated shutting down  organs.     No family history on file.  Prior to Admission medications   Medication Sig Start Date End Date Taking? Authorizing Provider  albuterol  (PROVENTIL  HFA;VENTOLIN  HFA) 108 (90 BASE) MCG/ACT inhaler Inhale 2 puffs into the lungs every 6 (six) hours as needed for wheezing or shortness of breath.    [provider]  amLODipine  (NORVASC ) 5 MG tablet Take 5 mg by mouth.    [provider]  ASPIRIN  81 PO 81 mg once a day    [provider]  cholecalciferol  (VITAMIN D ) 1000 UNITS tablet Take 1,000 Units by mouth daily.    [provider]  clopidogrel (PLAVIX) 75 MG tablet Take 75 mg by mouth.    [provider]  ezetimibe  (ZETIA ) 10 MG tablet Take 10 mg by mouth daily. 06/28/23   [provider]  Fluticasone-Umeclidin-Vilant (TRELEGY ELLIPTA) 200-62.5-25 MCG/ACT AEPB Inhale 1 puff into the lungs daily in the afternoon.    [provider]  Insulin  Aspart FlexPen (NOVOLOG ) 100 UNIT/ML Inject 0-2 Units into the skin See admin instructions. 08/14/23   [provider]  isosorbide  mononitrate (IMDUR ) 30 MG 24 hr tablet Take 30 mg by mouth 2 (two) times daily.     [provider]  LANTUS SOLOSTAR 100 UNIT/ML Solostar Pen 24 units sq once a day with meals 07/21/15   [provider]  lisinopril  (ZESTRIL ) 5 MG tablet Take 10 mg by mouth daily. 07/17/23   [provider]  metoprolol  tartrate (LOPRESSOR ) 25 MG tablet Take 25 mg by mouth 2 (two) times daily.    [provider]  montelukast  (SINGULAIR ) 10 MG tablet Take 1 tablet by mouth at bedtime.    [provider]  Multiple Vitamin (MULTIVITAMIN) capsule Take 1 capsule by mouth daily.    [provider]  nitroGLYCERIN  (NITROSTAT ) 0.4 MG SL tablet Place 0.4 mg under the tongue every 5 (five) minutes as needed for chest pain.    [provider]  pantoprazole  (PROTONIX ) 40 MG tablet Take 1 tablet (40 mg total) by mouth 2 (two)  times daily before a meal. 08/17/23   Tawkaliyar, Roya, DO  potassium chloride  (KLOR-CON ) 10 MEQ tablet Take 10 mEq by mouth daily.    [provider]  RANEXA 500 MG 12 hr tablet Take 500 mg by mouth 2 (two) times daily. 08/08/23   [provider]    Physical Exam: Vitals:   01/19/24 0329 01/19/24 0602 01/19/24 0848  BP: (!) 137/106 (!) 166/106 (!) 116/90  Pulse: 98  95  Resp: 18  16  Temp: 99.6 F (37.6 C)  98.4 F (36.9 C)  TempSrc: Oral  Oral  SpO2: 98%  97%  Weight: 80.2 kg    Height: 5' 8 (1.727 m)     Constitutional: NAD, calm, comfortable Respiratory: clear to auscultation bilaterally, no wheezing, no crackles. Normal respiratory effort. No accessory muscle use. RA Cardiovascular: Regular rate and rhythm, no murmurs / rubs / gallops. No extremity edema.  Distal pulses in right lower extremity present using  Doppler. Abdomen: no tenderness, no masses palpated. No hepatosplenomegaly. Bowel sounds positive.  Musculoskeletal: no clubbing / cyanosis. No joint deformity upper and lower extremities. Good ROM, no contractures. Normal muscle tone.  Skin: no rashes, lesions, ulcers.  Area of redness and induration right foot with streaking up the anterior tibia.  Pale discoloration to second toe. Neurologic: CN 2-12 grossly intact. Sensation intact, Strength 5/5 x all 4 extremities.  Psychiatric: Normal judgment and insight. Alert and oriented x 3. Normal mood.    Data Reviewed:  Sodium 130 with recent CBG 427, potassium 3.4, chloride 94, BUN 8, creatinine 0.82, calcium 8.6, magnesium 1.6, albumin 2.6 with normal total protein  WBC 7600 differential not obtained, hemoglobin 13.4, MCV 100.8, platelets 266,000  Hemoglobin A1c 8.7  Assessment and Plan: Cellulitis right toe in patient with diabetes mellitus 2 Underlying known peripheral vascular disease Appreciate assistance of podiatrist as well as vascular team Previous aortogram revealed 80% stenosis of the common  iliac arteries bilaterally with associated small vessel disease below the knee.  Patient missed repeat imaging due to forgetting appointment. Lower extremity arterial duplex with ABI pending Vascular team has ordered repeat CT angiogram abdomen and pelvis with runoff to evaluate distal aorta and iliac arteries as well as repeat angiogram to evaluate for right lower extremity perfusion.  Angiogram availability in the Cath Lab Monday 11/17 with Dr. Sheree Patient aware may require toe amputation; podiatrist has allowed weightbearing as tolerated to right foot and has recommended Betadine paint to the distal second toe as wound care Initiate broad-spectrum antibiotics with vancomycin and Zosyn Pain control with low-dose oral Dilaudid - low-dose IV Dilaudid for breakthrough pain  Neuropathic pain combined with acute pain in context of cellulitis Was not on neuropathic agents prior to admission Begin Lyrica 50 mg 3 times daily and adjust dosage as necessary  Diabetes mellitus 2 Continue home dose of insulin  glargine as well as sliding scale insulin  Hemoglobin A1c 8.7 indicating poorly controlled diabetes  CAD and prior MI Currently chest pain-free and does not have chest pain or shortness of breath with ambulation typically Continue Imdur  and Ranexa as well as Lopressor  Continue aspirin  81 mg and Plavix Continue Zetia   Hypertension Continue above medications Continue lisinopril , hydralazine  and Norvasc   COPD/tobacco use Currently not in an exacerbated state Initiated on Breztri  as therapeutic substitution for home med DuoNeb as needed wheezing or shortness of breath     Advance Care Planning:   Code Status: Full Code   VTE prophylaxis: Lovenox  Consults: Podiatry, vascular services  Family Communication: Patient only  Severity of Illness: The appropriate patient status for this patient is INPATIENT. Inpatient status is judged to be reasonable and necessary in order to provide the  required intensity of service to ensure the patient's safety. The patient's presenting symptoms, physical exam findings, and initial radiographic and laboratory data in the context of their chronic comorbidities is felt to place them at high risk for further clinical deterioration. Furthermore, it is not anticipated that the patient will be medically stable for discharge from the hospital within 2 midnights of admission.   * I certify that at the point of admission it is my clinical judgment that the patient will require inpatient hospital care spanning beyond 2 midnights from the point of admission due to high intensity of service, high risk for further deterioration and high frequency of surveillance required.*  Author: Isaiah Lever, NP 01/19/2024 9:46 AM  For on call review [email or web address].

## 2024-01-20 ENCOUNTER — Inpatient Hospital Stay (HOSPITAL_COMMUNITY)

## 2024-01-20 DIAGNOSIS — L03031 Cellulitis of right toe: Secondary | ICD-10-CM | POA: Diagnosis not present

## 2024-01-20 DIAGNOSIS — I96 Gangrene, not elsewhere classified: Secondary | ICD-10-CM | POA: Diagnosis not present

## 2024-01-20 LAB — GLUCOSE, CAPILLARY
Glucose-Capillary: 198 mg/dL — ABNORMAL HIGH (ref 70–99)
Glucose-Capillary: 218 mg/dL — ABNORMAL HIGH (ref 70–99)
Glucose-Capillary: 132 mg/dL — ABNORMAL HIGH (ref 70–99)
Glucose-Capillary: 89 mg/dL (ref 70–99)

## 2024-01-20 LAB — CBC
HCT: 36 % (ref 36.0–46.0)
Hemoglobin: 12.2 g/dL (ref 12.0–15.0)
MCH: 34.7 pg — ABNORMAL HIGH (ref 26.0–34.0)
MCHC: 33.9 g/dL (ref 30.0–36.0)
MCV: 102.3 fL — ABNORMAL HIGH (ref 80.0–100.0)
Platelets: 294 K/uL (ref 150–400)
RBC: 3.52 MIL/uL — ABNORMAL LOW (ref 3.87–5.11)
RDW: 12.7 % (ref 11.5–15.5)
WBC: 15.1 K/uL — ABNORMAL HIGH (ref 4.0–10.5)
nRBC: 0 % (ref 0.0–0.2)

## 2024-01-20 LAB — COMPREHENSIVE METABOLIC PANEL WITH GFR
ALT: 5 U/L (ref 0–44)
AST: 11 U/L — ABNORMAL LOW (ref 15–41)
Albumin: 2.3 g/dL — ABNORMAL LOW (ref 3.5–5.0)
Alkaline Phosphatase: 62 U/L (ref 38–126)
Anion gap: 11 (ref 5–15)
BUN: 11 mg/dL (ref 8–23)
CO2: 26 mmol/L (ref 22–32)
Calcium: 9.1 mg/dL (ref 8.9–10.3)
Chloride: 98 mmol/L (ref 98–111)
Creatinine, Ser: 0.74 mg/dL (ref 0.44–1.00)
GFR, Estimated: >60 mL/min
Glucose, Bld: 219 mg/dL — ABNORMAL HIGH (ref 70–99)
Potassium: 3.3 mmol/L — ABNORMAL LOW (ref 3.5–5.1)
Sodium: 135 mmol/L (ref 135–145)
Total Bilirubin: 0.4 mg/dL (ref 0.0–1.2)
Total Protein: 6.1 g/dL — ABNORMAL LOW (ref 6.5–8.1)

## 2024-01-20 MED ORDER — IOHEXOL 350 MG/ML SOLN
100.0000 mL | Freq: Once | INTRAVENOUS | Status: AC | PRN
  Administered 2024-01-20: 100 mL via INTRAVENOUS

## 2024-01-20 MED ORDER — MAGNESIUM SULFATE 2 GM/50ML IV SOLN
2.0000 g | Freq: Once | INTRAVENOUS | Status: AC
  Administered 2024-01-20: 2 g via INTRAVENOUS
  Filled 2024-01-20: qty 50

## 2024-01-20 MED ORDER — ALUM & MAG HYDROXIDE-SIMETH 200-200-20 MG/5ML PO SUSP
30.0000 mL | Freq: Four times a day (QID) | ORAL | Status: DC | PRN
  Administered 2024-01-20: 30 mL via ORAL
  Filled 2024-01-20: qty 30

## 2024-01-20 MED ORDER — POTASSIUM CHLORIDE CRYS ER 20 MEQ PO TBCR
40.0000 meq | EXTENDED_RELEASE_TABLET | Freq: Two times a day (BID) | ORAL | Status: AC
  Administered 2024-01-20 (×2): 40 meq via ORAL
  Filled 2024-01-20 (×2): qty 2

## 2024-01-20 MED ORDER — MAGNESIUM OXIDE -MG SUPPLEMENT 400 (240 MG) MG PO TABS
400.0000 mg | ORAL_TABLET | Freq: Two times a day (BID) | ORAL | Status: DC
  Administered 2024-01-20 – 2024-01-24 (×9): 400 mg via ORAL
  Filled 2024-01-20 (×9): qty 1

## 2024-01-20 NOTE — Hospital Course (Signed)
 Brandy Hall is a 63 y.o. female with medical history significant of diabetes mellitus 2, CAD with prior MI, peripheral vascular disease, COPD, ongoing tobacco abuse, hypertension, and dyslipidemia who had been following with podiatrist Dr. Loel in [place] presented to hospital with  pain in the foot for 2 weeks worsening over the last 5 to 7 days with blackish discoloration of fourth second toe with difficulty ambulating.  In the ED patient was hypotensive with blood pressure of 137/1.  Initial labs were notable for blood glucose levels at 432, hemoglobin A1c of 8.7, magnesium 1.6.  Patient did not have leukocytosis.  Sodium was low at 130 with potassium of 3.4.  Patient was then admitted to hospital for further evaluation and treatment  Assessment and Plan: Cellulitis right toe in patient with diabetes mellitus 2 Necrotic changes in the second toe. Severe peripheral vascular disease. Podiatry and vascular surgery were consulted. Previous aortogram revealed 80% stenosis of the common iliac arteries bilaterally with associated small vessel disease below the knee.  Lower extremity arterial duplex with ABI showed severe right lower extremity arterial disease and mild left lower extremity arterial disease. Vascular surgery recommended  CT angiogram abdomen and pelvis with runoff to evaluate distal aorta and iliac arteries as well as repeat angiogram to evaluate for right lower extremity perfusion with findings of abdominal aortic aneurysm of 3.1 cm with occlusion of the right common Ilac artery with reconstitution fusion of distal right common femoral artery with focal stenosis in the right anterior tibial artery.  Follow vascular surgery recommendation.  Likely Cath Lab intervention on 11/17 with Dr. Sheree.  Empirically on antibiotics with vancomycin and Zosyn for cellulitis and pain control.  Continue aspirin  Plavix, ezetimibe .  Focus on pain control with fentanyl .  Mild hypokalemia.  Will  replenish.  Check levels in AM.  Hypomagnesemia.  Will replenish.  Will check levels in AM.  Neuropathic pain, Has been started on Lyrica 50 mg 3 times daily.   Diabetes mellitus 2 Continue glargine and sliding scale insulin .  Hemoglobin A1c of 8.7.  Diabetic diet.  CAD/history of previous MI Continue Imdur  Ranexa aspirin  Plavix and Zetia .  No acute issues.   Hypertension Continue lisinopril , hydralazine  and Norvasc .  Blood pressure marginally on the lower side.  Continue with holding parameters.   COPD/tobacco use Continue DuoNeb and home inhalers.  Currently stable.

## 2024-01-20 NOTE — Plan of Care (Signed)

## 2024-01-20 NOTE — Progress Notes (Signed)
 PROGRESS NOTE  Brandy Hall FMW:[id number] DOB: Jan 10, 1961 DOA: 01/19/2024 PCP: Marelyn Quill, MD   LOS: 1 day   Brief narrative:  Brandy Hall is a 63 y.o. female with medical history significant of diabetes mellitus 2, CAD with prior MI, peripheral vascular disease, COPD, ongoing tobacco abuse, hypertension, and dyslipidemia who had been following with podiatrist Dr. Loel in [place] presented to hospital with  pain in the foot for 2 weeks worsening over the last 5 to 7 days with blackish discoloration of fourth second toe with difficulty ambulating.  In the ED patient was hypotensive with blood pressure of 137/1.  Initial labs were notable for blood glucose levels at 432, hemoglobin A1c of 8.7, magnesium 1.6.  Patient did not have leukocytosis.  Sodium was low at 130 with potassium of 3.4.  Patient was then admitted to hospital for further evaluation and treatment.   Assessment/Plan: Principal Problem:   Cellulitis of right toe Active Problems:   Gangrene of toe of right foot (HCC)  Cellulitis right toe in patient with diabetes mellitus 2 Necrotic changes in the second toe. Severe peripheral vascular disease with critical limb ischemia of the right second toe Podiatry and vascular surgery were consulted. Previous aortogram revealed 80% stenosis of the common iliac arteries bilaterally with associated small vessel disease below the knee.  Lower extremity arterial duplex with ABI showed severe right lower extremity arterial disease and mild left lower extremity arterial disease. Vascular surgery recommended  CT angiogram abdomen and pelvis with runoff to evaluate distal aorta and iliac arteries as well as repeat angiogram to evaluate for right lower extremity perfusion with findings of abdominal aortic aneurysm of 3.1 cm with occlusion of the right common Ilac artery with reconstitution fusion of distal right common femoral artery with focal stenosis in the right anterior tibial  artery.  Follow vascular surgery recommendation.  Likely Cath Lab intervention on 11/17 with Dr. Sheree.  Empirically on antibiotics with vancomycin and Zosyn for cellulitis and pain control.  Continue aspirin  Plavix, ezetimibe .  Focus on pain control with fentanyl .  Mild hypokalemia.  Will replenish.  Check levels in AM.  Hypomagnesemia.  Will replenish.  Will check levels in AM.  Neuropathic pain, Has been started on Lyrica 50 mg 3 times daily.   Diabetes mellitus 2 Continue glargine and sliding scale insulin .  Hemoglobin A1c of 8.7.  Diabetic diet.  CAD/history of previous MI Continue Imdur  Ranexa aspirin  Plavix and Zetia .  No acute issues.   Hypertension Continue lisinopril , hydralazine  and Norvasc .  Blood pressure marginally on the lower side.  Continue with holding parameters.   COPD/tobacco use Continue DuoNeb and home inhalers.  Currently stable.  DVT prophylaxis: enoxaparin (LOVENOX) injection 40 mg Start: 01/19/24 1030   Disposition: Home likely in 2 to 3 days  Status is: Inpatient Remains inpatient appropriate because: Need for vascular intervention, pending clinical improvement    Code Status:     Code Status: Full Code  Family Communication: None at bedside  Consultants: Neurosurgery Podiatry  Procedures: ABI  Anti-infectives:  Vancomycin and Zosyn IV  Anti-infectives (From admission, onward)    Start     Dose/Rate Route Frequency Ordered Stop   01/20/24 0400  vancomycin (VANCOREADY) IVPB 750 mg/150 mL        750 mg 150 mL/hr over 60 Minutes Intravenous Every 12 hours 01/19/24 1034     01/19/24 1130  piperacillin-tazobactam (ZOSYN) IVPB 3.375 g        3.375 g 12.5 mL/hr over  240 Minutes Intravenous Every 8 hours 01/19/24 1019     01/19/24 1130  vancomycin (VANCOREADY) IVPB 1500 mg/300 mL        1,500 mg 150 mL/hr over 120 Minutes Intravenous  Once 01/19/24 1019 01/19/24 1717        Subjective: Today, patient was seen and examined at bedside.   Still complains of pain over the extremities but better controlled at this time.  Denies any fever chills nausea vomiting shortness of breath or dyspnea.  Objective: Vitals:   01/20/24 0743 01/20/24 0827  BP:  98/62  Pulse: 70 76  Resp: 17   Temp:  97.7 F (36.5 C)  SpO2: 98% 94%   No intake or output data in the 24 hours ending 01/20/24 1128 Filed Weights   01/19/24 0329  Weight: 80.2 kg   Body mass index is 26.88 kg/m.   Physical Exam:  GENERAL: Patient is alert awake and oriented. Not in obvious distress. HENT: No scleral pallor or icterus. Pupils equally reactive to light. Oral mucosa is moist NECK: is supple, no gross swelling noted. CHEST: Clear to auscultation. No crackles or wheezes.  Diminished breath sounds bilaterally. CVS: S1 and S2 heard, no murmur. Regular rate and rhythm.  ABDOMEN: Soft, non-tender, bowel sounds are present. EXTREMITIES: No edema.  Right second toe with gangrenous changes.  Mild erythema noted over the foot. CNS: Cranial nerves are intact. No focal motor deficits. SKIN: warm and dry without rashes.  Data Review: I have personally reviewed the following laboratory data and studies,  CBC: Recent Labs  Lab 01/19/24 0648 01/20/24 0416  WBC 7.6 15.1*  HGB 13.4 12.2  HCT 39.2 36.0  MCV 100.8* 102.3*  PLT 266 294   Basic Metabolic Panel: Recent Labs  Lab 01/19/24 0648 01/20/24 0416  NA 130* 135  K 3.4* 3.3*  CL 94* 98  CO2 24 26  GLUCOSE 432* 219*  BUN 8 11  CREATININE 0.82 0.74  CALCIUM 8.6* 9.1  MG 1.6*  --    Liver Function Tests: Recent Labs  Lab 01/19/24 0648 01/20/24 0416  AST 13* 11*  ALT 8 5  ALKPHOS 78 62  BILITOT 0.6 0.4  PROT 6.8 6.1*  ALBUMIN 2.6* 2.3*   No results for input(s): LIPASE, AMYLASE in the last 168 hours. No results for input(s): AMMONIA in the last 168 hours. Cardiac Enzymes: No results for input(s): CKTOTAL, CKMB, CKMBINDEX, TROPONINI in the last 168 hours. BNP (last 3  results) No results for input(s): BNP in the last 8760 hours.  ProBNP (last 3 results) No results for input(s): PROBNP in the last 8760 hours.  CBG: Recent Labs  Lab 01/19/24 0535 01/19/24 1052 01/19/24 1555 01/19/24 2104 01/20/24 0606  GLUCAP 427* 329* 201* 384* 198*   No results found for this or any previous visit (from the past 240 hours).   Studies: CT ANGIO AO+BIFEM W & OR WO CONTRAST Result Date: 01/20/2024 CLINICAL DATA:  Atherosclerosis with rest pain EXAM: CT ANGIOGRAPHY OF ABDOMINAL AORTA WITH ILIOFEMORAL RUNOFF TECHNIQUE: Multidetector CT imaging of the abdomen, pelvis and lower extremities was performed using the standard protocol during bolus administration of intravenous contrast. Multiplanar CT image reconstructions and MIPs were obtained to evaluate the vascular anatomy. RADIATION DOSE REDUCTION: This exam was performed according to the departmental dose-optimization program which includes automated exposure control, adjustment of the mA and/or kV according to patient size and/or use of iterative reconstruction technique. CONTRAST:  100mL OMNIPAQUE IOHEXOL 350 MG/ML SOLN COMPARISON:  CTA  of the chest abdomen and pelvis performed April 13, 2022 FINDINGS: VASCULAR Aorta: Fusiform aneurysm of the infrarenal segment measuring 3.1 x 3.0 cm with moderate wall adherent plaque/thrombus. Celiac: Patent. SMA: Patent. Renals: Patent. IMA: Patent. RIGHT Lower Extremity Inflow: The right common iliac artery is occluded in the proximal segment. The occluded segment is dilated measuring 1.9 cm in largest dimension. The length of the occlusion is approximately 4 cm. The distal right common iliac artery is reconstituted. A moderate stenosis is present in the proximal right internal iliac artery. The right external iliac artery is ectatic and demonstrates mild diffuse disease. Outflow: Mild atherosclerotic changes secondary to fibrofatty and calcified plaque in the right common femoral  artery. Multifocal mild disease is present within the right SFA. The right popliteal artery is patent. Runoff: Proximal origin of the right anterior tibial artery. There is a focal moderate stenosis estimated at 50% in the mid segment of the anterior tibial artery. The posterior tibial artery and peroneal arteries are patent. LEFT Lower Extremity Inflow: Focal mild-to-moderate stenosis is present the proximal left common iliac artery which is estimated at 50%. There is mild dilation of the distal left common iliac artery. A moderate stenosis is present in the proximal left internal iliac artery. A mild stenosis is present in the proximal left external iliac artery. The left external iliac artery is patent. Outflow: Left common femoral artery is patent. Mild atherosclerotic changes are present in the left SFA. Left popliteal artery is patent. Runoff: Proximal origin of the left anterior tibial artery. A focal moderate stenosis is present in the distal posterior tibial artery. The peroneal artery is patent. Veins: No obvious venous abnormality within the limitations of this arterial phase study. Review of the MIP images confirms the above findings. NON-VASCULAR Lower chest: Nothing acute. Hepatobiliary: Postsurgical changes from cholecystectomy. Pancreas: Unremarkable. No pancreatic ductal dilatation or surrounding inflammatory changes. Spleen: Normal in size without focal abnormality. Adrenals/Urinary Tract: The adrenal glands are within normal limits. There is no hydronephrosis. Small cysts are present in both kidneys. Largest in the right kidney measures 2.0 cm in the largest in the left kidney measures 3.6 cm. These are low in attenuation in favored to be simple. The ureters are grossly unremarkable. The bladder is within normal limits. Stomach/Bowel: No dilated loops of bowel are seen. Colonic diverticulosis. Lymphatic: No significant lymphadenopathy. Reproductive: Status post hysterectomy. No adnexal masses.  Other: Nothing significant. Musculoskeletal: Degenerative changes are present in the lumbar spine. Chronic L1 compression deformity. IMPRESSION: 1. Abdominal aortic aneurysm measuring 3.1 cm. 2. Right lower extremity: Occlusion of the right common iliac artery measuring approximately 4 cm in length. Reconstitution of the distal right common iliac artery. Mild outflow disease. Focal moderate stenosis in the right anterior tibial artery. The peroneal and posterior tibial arteries are patent. 3. Left lower extremity: Mild to moderate disease is present in the proximal left common iliac artery. The left external iliac artery is patent. Patent outflow with mild disease. Focal moderate stenosis in the distal left posterior tibial artery. The anterior tibial and peroneal arteries are patent. Electronically Signed   By: Maude Naegeli M.D.   On: 01/20/2024 08:07   VAS US  ABI WITH/WO TBI Result Date: 01/19/2024  LOWER EXTREMITY DOPPLER STUDY Patient Name:  Brandy Hall  Date of Exam:   01/19/2024 Medical Rec #: [id number]         Accession #:    [id number] Date of Birth: [birth year]         Patient  Gender: F Patient Age:   63 years Exam Location:  [facility] Procedure:      VAS US  ABI WITH/WO TBI Referring Phys: TERETHA DAMME --------------------------------------------------------------------------------  Indications: Peripheral artery disease. Present with worsening erythema and pai              in her fight foot and worsening necrotic changes in the 2nd right              toe. High Risk Factors: Hypertension, hyperlipidemia, Diabetes, past history of                    smoking, coronary artery disease. Other Factors: History of bilateral common iliac arteries 80% stenosis on CTA on                09/06/23. No intervention performed.  Performing Technologist: Ricka Sturdivant-Jones RDMS, RVT  Examination Guidelines: A complete evaluation includes at minimum, Doppler waveform signals and systolic blood pressure  reading at the level of bilateral brachial, anterior tibial, and posterior tibial arteries, when vessel segments are accessible. Bilateral testing is considered an integral part of a complete examination. Photoelectric Plethysmograph (PPG) waveforms and toe systolic pressure readings are included as required and additional duplex testing as needed. Limited examinations for reoccurring indications may be performed as noted.  ABI Findings: +---------+------------------+-----+----------+--------+ Right    Rt Pressure (mmHg)IndexWaveform  Comment  +---------+------------------+-----+----------+--------+ Brachial 133                    triphasic          +---------+------------------+-----+----------+--------+ PTA      58                0.42 monophasic         +---------+------------------+-----+----------+--------+ DP       55                0.40 monophasic         +---------+------------------+-----+----------+--------+ Great Toe                       Absent             +---------+------------------+-----+----------+--------+ +---------+------------------+-----+---------+-------+ Left     Lt Pressure (mmHg)IndexWaveform Comment +---------+------------------+-----+---------+-------+ Brachial 137                    triphasic        +---------+------------------+-----+---------+-------+ PTA      120               0.88 biphasic         +---------+------------------+-----+---------+-------+ DP       115               0.84 biphasic         +---------+------------------+-----+---------+-------+ Great Toe96                0.70 Normal           +---------+------------------+-----+---------+-------+ +-------+-----------+-----------+------------+------------+ ABI/TBIToday's ABIToday's TBIPrevious ABIPrevious TBI +-------+-----------+-----------+------------+------------+ Right  0.42       absent     0.69        0.72          +-------+-----------+-----------+------------+------------+ Left   0.84       0.70       0.99        0.85         +-------+-----------+-----------+------------+------------+ Bilateral ABIs and TBIs appear decreased compared to prior study on  06/26/23.  Summary: Right: Resting right ankle-brachial index indicates severe right lower extremity arterial disease.  Left: Resting left ankle-brachial index indicates mild left lower extremity arterial disease. The left toe-brachial index is normal.  *See table(s) above for measurements and observations.  Electronically signed by Penne Colorado MD on 01/19/2024 at 6:03:38 PM.    Final    VAS US  LOWER EXTREMITY ARTERIAL DUPLEX Result Date: 01/19/2024 LOWER EXTREMITY ARTERIAL DUPLEX STUDY Patient Name:  Brandy Hall  Date of Exam:   01/19/2024 Medical Rec #: [id number]         Accession #:    [id number] Date of Birth: 29-Mar-1960         Patient Gender: F Patient Age:   74 years Exam Location:  [facility] Procedure:      VAS US  LOWER EXTREMITY ARTERIAL DUPLEX Referring Phys: TERETHA DAMME --------------------------------------------------------------------------------  Indications: Peripheral artery disease, and presenting with worsening erythema              and pain in her right foot and worsening necrotic changes in the              2nd toe. High Risk Factors: Hypertension, hyperlipidemia, Diabetes, past history of                    smoking, coronary artery disease. Other Factors: COPD, GERD                Bilateral CIA 80% stenosis on 09/06/23 arteriogram. No intervention                performed.  Current ABI: Right 0.42, Left 0.84 Performing Technologist: Ricka Sturdivant-Jones RDMS, RVT  Examination Guidelines: A complete evaluation includes B-mode imaging, spectral Doppler, color Doppler, and power Doppler as needed of all accessible portions of each vessel. Bilateral testing is considered an integral part of a complete examination. Limited examinations  for reoccurring indications may be performed as noted.  +-----------+--------+-----+---------------+-------------------+--------+ RIGHT      PSV cm/sRatioStenosis       Waveform           Comments +-----------+--------+-----+---------------+-------------------+--------+ CIA Prox   360          75-99% stenosis                            +-----------+--------+-----+---------------+-------------------+--------+ EIA Mid    48                          monophasic                  +-----------+--------+-----+---------------+-------------------+--------+ CFA Mid    50                          monophasic                  +-----------+--------+-----+---------------+-------------------+--------+ DFA        25                          monophasic                  +-----------+--------+-----+---------------+-------------------+--------+ SFA Prox   40                          monophasic                  +-----------+--------+-----+---------------+-------------------+--------+  SFA Mid    48                          monophasic                  +-----------+--------+-----+---------------+-------------------+--------+ SFA Distal 31                          monophasic                  +-----------+--------+-----+---------------+-------------------+--------+ POP Prox   30                          monophasic                  +-----------+--------+-----+---------------+-------------------+--------+ POP Distal 29                          monophasic                  +-----------+--------+-----+---------------+-------------------+--------+ ATA Prox   27                          monophasic                  +-----------+--------+-----+---------------+-------------------+--------+ ATA Mid    20                          monophasic                  +-----------+--------+-----+---------------+-------------------+--------+ ATA Distal 18                           monophasic                  +-----------+--------+-----+---------------+-------------------+--------+ PTA Prox   28                          monophasic                  +-----------+--------+-----+---------------+-------------------+--------+ PTA Mid    26                          monophasic                  +-----------+--------+-----+---------------+-------------------+--------+ PTA Distal 27                          monophasic                  +-----------+--------+-----+---------------+-------------------+--------+ PERO Mid   19                          monophasic                  +-----------+--------+-----+---------------+-------------------+--------+ PERO Distal19                          monophasic                  +-----------+--------+-----+---------------+-------------------+--------+ DP         14  dampened monophasic         +-----------+--------+-----+---------------+-------------------+--------+  Summary: Right: 75-99% stenosis in the right common iliac artery. No significant stenosis noted distally.  See table(s) above for measurements and observations. Electronically signed by Penne Colorado MD on 01/19/2024 at 6:02:56 PM.    Final    DG Foot 2 Views Right Result Date: 01/19/2024 EXAM: 1 or 2 VIEW(S) XRAY OF THE RIGHT FOOT 01/19/2024 10:19:00 AM COMPARISON: Outside right foot radiograph 01/18/2024. CLINICAL HISTORY: Gangrene (HCC) 10012. FINDINGS: BONES AND JOINTS: No acute fracture. No focal osseous lesion. No joint dislocation. Mild degenerative changes of the 1st MTP (metatarsophalangeal) joint. SOFT TISSUES: Prominent diffuse soft tissue swelling throughout the foot. IMPRESSION: 1. Prominent diffuse soft tissue swelling throughout the foot. No focal bony abnormality. Electronically signed by: Ryan Chess MD 01/19/2024 02:58 PM EST RP Workstation: [id number]      Vernal Alstrom, MD  [facility] 01/20/2024  If  7PM-7AM, please contact night-coverage

## 2024-01-20 NOTE — Progress Notes (Signed)
  Progress Note    01/20/2024 11:01 AM * No surgery found *  Subjective: No overnight issues  Vitals:   01/20/24 0743 01/20/24 0827  BP:  98/62  Pulse: 70 76  Resp: 17   Temp:  97.7 F (36.5 C)  SpO2: 98% 94%    Physical Exam: Awake alert and oriented Nonlabored respirations Stable gangrene right second toe  CBC    Component Value Date/Time   WBC 15.1 (H) 01/20/2024 0416   RBC 3.52 (L) 01/20/2024 0416   HGB 12.2 01/20/2024 0416   HGB 14.0 12/20/2023 1104   HCT 36.0 01/20/2024 0416   HCT 42.0 12/20/2023 1104   PLT 294 01/20/2024 0416   PLT 213 12/20/2023 1104   MCV 102.3 (H) 01/20/2024 0416   MCV 106 (H) 12/20/2023 1104   MCH 34.7 (H) 01/20/2024 0416   MCHC 33.9 01/20/2024 0416   RDW 12.7 01/20/2024 0416   RDW 13.2 12/20/2023 1104   LYMPHSABS 1.0 12/20/2023 1104   MONOABS 0.7 08/16/2023 0952   EOSABS 0.0 12/20/2023 1104   BASOSABS 0.0 12/20/2023 1104    BMET    Component Value Date/Time   NA 135 01/20/2024 0416   NA 140 12/20/2023 1104   K 3.3 (L) 01/20/2024 0416   CL 98 01/20/2024 0416   CO2 26 01/20/2024 0416   GLUCOSE 219 (H) 01/20/2024 0416   BUN 11 01/20/2024 0416   BUN 7 (L) 12/20/2023 1104   CREATININE 0.74 01/20/2024 0416   CALCIUM 9.1 01/20/2024 0416   GFRNONAA >60 01/20/2024 0416    INR No results found for: INR  No intake or output data in the 24 hours ending 01/20/24 1101   CT IMPRESSION: 1. Abdominal aortic aneurysm measuring 3.1 cm. 2. Right lower extremity: Occlusion of the right common iliac artery measuring approximately 4 cm in length. Reconstitution of the distal right common iliac artery. Mild outflow disease. Focal moderate stenosis in the right anterior tibial artery. The peroneal and posterior tibial arteries are patent. 3. Left lower extremity: Mild to moderate disease is present in the proximal left common iliac artery. The left external iliac artery is patent. Patent outflow with mild disease. Focal moderate  stenosis in the distal left posterior tibial artery. The anterior tibial and peroneal arteries are patent.  Assessment/plan:  63 y.o. female is here with gangrene of right second toe secondary to progressed atherosclerotic disease of her native arteries.  Moderately long segment of right common iliac artery occlusion.  Plan will be for revascularization this Monday or Tuesday.  CTA results reviewed with the patient and will again discuss surgical options tomorrow.   Shakedra Beam C. Sheree, MD [facility] Office: [phone] Pager: [phone]  01/20/2024 11:01 AM

## 2024-01-20 NOTE — Progress Notes (Signed)
 Patient complained of chest pain rated pain at a 5. She stated that it feels like indigestion. V/S are as follows B/P 108/76, P 70, Resp 18, O2 100%, Temp 98.1. Provider Andrez notified of chest pain/indigestion. Orders received (EKG. Maalox ordered). EKG results sent to patients chart, provider reviewed.

## 2024-01-20 NOTE — Progress Notes (Signed)
  Subjective:  Patient ID: Brandy Hall, female    DOB: [birth year],  MRN: [id number]  Patient seen in bed resting fairly comfortably says her pain has been improving with medications over the last night, she is afebrile but better she is able to keep it down to let start swelling and she has not elevated again.  Negative for chest pain and shortness of breath Fever: no Night sweats: no Constitutional signs: no Review of all other systems is negative Objective:   Vitals:   01/20/24 0743 01/20/24 0827  BP:  98/62  Pulse: 70 76  Resp: 17   Temp:  97.7 F (36.5 C)  SpO2: 98% 94%   General AA&O x3. Normal mood and affect.  Vascular Foot is warmer at the ankle and cooler at the toes  Neurologic Epicritic sensation grossly intact.  Dermatologic Early dry gangrene of right second toe, some bruising at distal tip of hallux  Orthopedic: MMT 5/5 in dorsiflexion, plantarflexion, inversion, and eversion. Normal joint ROM without pain or crepitus.    Assessment & Plan:  Patient was evaluated and treated and all questions answered.  Critical limb ischemia, early gangrene of the second toe - Gangrenous changes are stable there is no purulence or signs of acute infection.  Plan is for vascular intervention Monday or Tuesday with angiography.  I discussed with her pending her progress afterwards with tissue demarcation, and the infection and pain may need partial digital amputation next week, may also be a watch and wait situation that may be able to be managed outpatient to see which tissues able to recover following revascularization.  For now weightbearing as tolerated bilateral, does not need to elevate foot, no dressings needed on foot.  Brandy Hall Medicine, DPM  Accessible via secure chat for questions or concerns.

## 2024-01-21 ENCOUNTER — Encounter (HOSPITAL_COMMUNITY): Payer: Self-pay | Admitting: Internal Medicine

## 2024-01-21 DIAGNOSIS — L03031 Cellulitis of right toe: Secondary | ICD-10-CM | POA: Diagnosis not present

## 2024-01-21 LAB — GLUCOSE, CAPILLARY
Glucose-Capillary: 183 mg/dL — ABNORMAL HIGH (ref 70–99)
Glucose-Capillary: 120 mg/dL — ABNORMAL HIGH (ref 70–99)
Glucose-Capillary: 216 mg/dL — ABNORMAL HIGH (ref 70–99)
Glucose-Capillary: 206 mg/dL — ABNORMAL HIGH (ref 70–99)
Glucose-Capillary: 191 mg/dL — ABNORMAL HIGH (ref 70–99)

## 2024-01-21 LAB — BASIC METABOLIC PANEL WITH GFR
Anion gap: 11 (ref 5–15)
BUN: 8 mg/dL (ref 8–23)
CO2: 23 mmol/L (ref 22–32)
Calcium: 8.5 mg/dL — ABNORMAL LOW (ref 8.9–10.3)
Chloride: 102 mmol/L (ref 98–111)
Creatinine, Ser: 0.73 mg/dL (ref 0.44–1.00)
GFR, Estimated: >60 mL/min (ref >60)
Glucose, Bld: 183 mg/dL — ABNORMAL HIGH (ref 70–99)
Potassium: 4.2 mmol/L (ref 3.5–5.1)
Sodium: 136 mmol/L (ref 135–145)

## 2024-01-21 LAB — CBC
HCT: 34.8 % — ABNORMAL LOW (ref 36.0–46.0)
Hemoglobin: 11.6 g/dL — ABNORMAL LOW (ref 12.0–15.0)
MCH: 34.4 pg — ABNORMAL HIGH (ref 26.0–34.0)
MCHC: 33.3 g/dL (ref 30.0–36.0)
MCV: 103.3 fL — ABNORMAL HIGH (ref 80.0–100.0)
Platelets: 288 K/uL (ref 150–400)
RBC: 3.37 MIL/uL — ABNORMAL LOW (ref 3.87–5.11)
RDW: 12.8 % (ref 11.5–15.5)
WBC: 8.6 K/uL (ref 4.0–10.5)
nRBC: 0 % (ref 0.0–0.2)

## 2024-01-21 LAB — PHOSPHORUS: Phosphorus: 2.8 mg/dL (ref 2.5–4.6)

## 2024-01-21 LAB — MAGNESIUM: Magnesium: 2 mg/dL (ref 1.7–2.4)

## 2024-01-21 MED ORDER — NICOTINE POLACRILEX 2 MG MT GUM: 2.0000 mg | CHEWING_GUM | OROMUCOSAL | Status: DC | PRN

## 2024-01-21 MED ORDER — NICOTINE 21 MG/24HR TD PT24
21.0000 mg | MEDICATED_PATCH | Freq: Every day | TRANSDERMAL | Status: DC
  Administered 2024-01-21 – 2024-01-24 (×4): 21 mg via TRANSDERMAL
  Filled 2024-01-21 (×4): qty 1

## 2024-01-21 NOTE — Progress Notes (Signed)
 PROGRESS NOTE  DARRIELLE PFLIEGER FMW:[id number] DOB: [birth year] DOA: 01/19/2024 PCP: Marelyn Quill, MD   LOS: 2 days   Brief narrative:  SHALVA ROZYCKI is a 63 y.o. female with medical history significant of diabetes mellitus 2, CAD with prior MI, peripheral vascular disease, COPD, ongoing tobacco abuse, hypertension, and dyslipidemia who had been following with podiatrist Dr. Loel in [place] presented to hospital with  pain in the foot for 2 weeks worsening over the last 5 to 7 days with blackish discoloration of fourth second toe with difficulty ambulating.  In the ED patient was hypotensive with blood pressure of 137/1.  Initial labs were notable for blood glucose levels at 432, hemoglobin A1c of 8.7, magnesium 1.6.  Patient did not have leukocytosis.  Sodium was low at 130 with potassium of 3.4.  Patient was then admitted to hospital for further evaluation and treatment.   Assessment/Plan: Principal Problem:   Cellulitis of right toe Active Problems:   Gangrene of toe of right foot (HCC)  Cellulitis right toe/foot in patient with diabetes mellitus 2 Severe peripheral vascular disease with critical limb ischemia of the right second toe with necrosis Podiatry and vascular surgery on board. Previous aortogram revealed 80% stenosis of the common iliac arteries bilaterally with associated small vessel disease below the knee.  Lower extremity arterial duplex with ABI showed severe right lower extremity arterial disease and mild left lower extremity arterial disease. Vascular surgery recommended  CT angiogram abdomen and pelvis with runoff to evaluate distal aorta and iliac arteries as well as repeat angiogram to evaluate for right lower extremity perfusion with findings of abdominal aortic aneurysm of 3.1 cm with occlusion of the right common Ilac artery with reconstitution fusion of distal right common femoral artery with focal stenosis in the right anterior tibial artery.  Plan for  angiogram on 11/17 with Dr. Sheree.  Empirically on antibiotics with vancomycin and Zosyn for cellulitis and will pain control.  Continue aspirin  Plavix, ezetimibe .    Mild hypokalemia.  Improved after replacement.  Latest potassium of 4.2.  Hypomagnesemia.  Improved.  Latest magnesium of 2.0  Neuropathic pain, Has been started on Lyrica 50 mg 3 times daily.   Diabetes mellitus 2 Continue glargine and sliding scale insulin .  Hemoglobin A1c of 8.7.  Diabetic diet.  Latest POC glucose of 120.  CAD/history of previous MI Continue Imdur  Ranexa aspirin  Plavix and Zetia .  No acute issues.   Hypertension Continue lisinopril , hydralazine  and Norvasc .    Continue with holding parameters.   COPD/tobacco use Continue DuoNeb and home inhalers.  Currently stable.  Had a lengthy conversation with the patient regarding quitting smoking and use of Chantix/nicotine patch.  She states that she has Chantix at home which she was supposed to use.  Will add nicotine patch and gums while in the hospital.  DVT prophylaxis: enoxaparin (LOVENOX) injection 40 mg Start: 01/19/24 1030   Disposition: Home likely in 2 to 3 days  Status is: Inpatient Remains inpatient appropriate because: Need for vascular/podiatry intervention, pending clinical improvement    Code Status:     Code Status: Full Code  Family Communication: None at bedside  Consultants: Neurosurgery Podiatry  Procedures: ABI  Anti-infectives:  Vancomycin and Zosyn IV  Anti-infectives (From admission, onward)    Start     Dose/Rate Route Frequency Ordered Stop   01/20/24 0400  vancomycin (VANCOREADY) IVPB 750 mg/150 mL        750 mg 150 mL/hr over 60 Minutes Intravenous Every 12  hours 01/19/24 1034     01/19/24 1130  piperacillin-tazobactam (ZOSYN) IVPB 3.375 g        3.375 g 12.5 mL/hr over 240 Minutes Intravenous Every 8 hours 01/19/24 1019     01/19/24 1130  vancomycin (VANCOREADY) IVPB 1500 mg/300 mL        1,500 mg 150 mL/hr  over 120 Minutes Intravenous  Once 01/19/24 1019 01/19/24 1717        Subjective: Today, patient was seen and examined at bedside.  Patient denies any nausea vomiting fever chills or rigor.  Pain okay with medications.  Discussed at length regarding lifestyle modification including smoking  Objective: Vitals:   01/21/24 0326 01/21/24 0831  BP: 121/81   Pulse: 62 74  Resp: 19 17  Temp: 98 F (36.7 C)   SpO2: 99% 94%    Intake/Output Summary (Last 24 hours) at 01/21/2024 1149 Last data filed at 01/21/2024 1025 Gross per 24 hour  Intake 812.73 ml  Output --  Net 812.73 ml   Filed Weights   01/19/24 0329  Weight: 80.2 kg   Body mass index is 26.88 kg/m.   Physical Exam:  General:  Average built, not in obvious distress HENT:   No scleral pallor or icterus noted. Oral mucosa is moist.  Chest:  Clear breath sounds.  Diminished breath sounds bilaterally. No crackles or wheezes.  CVS: S1 &S2 heard. No murmur.  Regular rate and rhythm. Abdomen: Soft, nontender, nondistended.  Bowel sounds are heard.   Extremities: Right second toe with gangrenous changes.  Erythema noted over the right foot. Psych: Alert, awake and oriented, normal mood CNS:  No cranial nerve deficits.  Power equal in all extremities.   Skin: Warm and dry.  Right foot with erythema, gangrene second toe.   Data Review: I have personally reviewed the following laboratory data and studies,  CBC: Recent Labs  Lab 01/19/24 0648 01/20/24 0416 01/21/24 0712  WBC 7.6 15.1* 8.6  HGB 13.4 12.2 11.6*  HCT 39.2 36.0 34.8*  MCV 100.8* 102.3* 103.3*  PLT 266 294 288   Basic Metabolic Panel: Recent Labs  Lab 01/19/24 0648 01/20/24 0416 01/21/24 0712  NA 130* 135 136  K 3.4* 3.3* 4.2  CL 94* 98 102  CO2 24 26 23   GLUCOSE 432* 219* 183*  BUN 8 11 8   CREATININE 0.82 0.74 0.73  CALCIUM 8.6* 9.1 8.5*  MG 1.6*  --  2.0  PHOS  --   --  2.8   Liver Function Tests: Recent Labs  Lab 01/19/24 0648  01/20/24 0416  AST 13* 11*  ALT 8 5  ALKPHOS 78 62  BILITOT 0.6 0.4  PROT 6.8 6.1*  ALBUMIN 2.6* 2.3*   No results for input(s): LIPASE, AMYLASE in the last 168 hours. No results for input(s): AMMONIA in the last 168 hours. Cardiac Enzymes: No results for input(s): CKTOTAL, CKMB, CKMBINDEX, TROPONINI in the last 168 hours. BNP (last 3 results) No results for input(s): BNP in the last 8760 hours.  ProBNP (last 3 results) No results for input(s): PROBNP in the last 8760 hours.  CBG: Recent Labs  Lab 01/20/24 1154 01/20/24 1813 01/20/24 2026 01/21/24 0603 01/21/24 0826  GLUCAP 218* 132* 89 183* 120*   No results found for this or any previous visit (from the past 240 hours).   Studies: CT ANGIO AO+BIFEM W & OR WO CONTRAST Result Date: 01/20/2024 CLINICAL DATA:  Atherosclerosis with rest pain EXAM: CT ANGIOGRAPHY OF ABDOMINAL AORTA WITH ILIOFEMORAL  RUNOFF TECHNIQUE: Multidetector CT imaging of the abdomen, pelvis and lower extremities was performed using the standard protocol during bolus administration of intravenous contrast. Multiplanar CT image reconstructions and MIPs were obtained to evaluate the vascular anatomy. RADIATION DOSE REDUCTION: This exam was performed according to the departmental dose-optimization program which includes automated exposure control, adjustment of the mA and/or kV according to patient size and/or use of iterative reconstruction technique. CONTRAST:  OMNIPAQUE IOHEXOL 350 MG/ML SOLN COMPARISON:  CTA of the chest abdomen and pelvis performed April 13, 2022 FINDINGS: VASCULAR Aorta: Fusiform aneurysm of the infrarenal segment measuring 3.1 x 3.0 cm with moderate wall adherent plaque/thrombus. Celiac: Patent. SMA: Patent. Renals: Patent. IMA: Patent. RIGHT Lower Extremity Inflow: The right common iliac artery is occluded in the proximal segment. The occluded segment is dilated measuring 1.9 cm in largest dimension. The length of the  occlusion is approximately 4 cm. The distal right common iliac artery is reconstituted. A moderate stenosis is present in the proximal right internal iliac artery. The right external iliac artery is ectatic and demonstrates mild diffuse disease. Outflow: Mild atherosclerotic changes secondary to fibrofatty and calcified plaque in the right common femoral artery. Multifocal mild disease is present within the right SFA. The right popliteal artery is patent. Runoff: Proximal origin of the right anterior tibial artery. There is a focal moderate stenosis estimated at 50% in the mid segment of the anterior tibial artery. The posterior tibial artery and peroneal arteries are patent. LEFT Lower Extremity Inflow: Focal mild-to-moderate stenosis is present the proximal left common iliac artery which is estimated at 50%. There is mild dilation of the distal left common iliac artery. A moderate stenosis is present in the proximal left internal iliac artery. A mild stenosis is present in the proximal left external iliac artery. The left external iliac artery is patent. Outflow: Left common femoral artery is patent. Mild atherosclerotic changes are present in the left SFA. Left popliteal artery is patent. Runoff: Proximal origin of the left anterior tibial artery. A focal moderate stenosis is present in the distal posterior tibial artery. The peroneal artery is patent. Veins: No obvious venous abnormality within the limitations of this arterial phase study. Review of the MIP images confirms the above findings. NON-VASCULAR Lower chest: Nothing acute. Hepatobiliary: Postsurgical changes from cholecystectomy. Pancreas: Unremarkable. No pancreatic ductal dilatation or surrounding inflammatory changes. Spleen: Normal in size without focal abnormality. Adrenals/Urinary Tract: The adrenal glands are within normal limits. There is no hydronephrosis. Small cysts are present in both kidneys. Largest in the right kidney measures 2.0 cm in  the largest in the left kidney measures 3.6 cm. These are low in attenuation in favored to be simple. The ureters are grossly unremarkable. The bladder is within normal limits. Stomach/Bowel: No dilated loops of bowel are seen. Colonic diverticulosis. Lymphatic: No significant lymphadenopathy. Reproductive: Status post hysterectomy. No adnexal masses. Other: Nothing significant. Musculoskeletal: Degenerative changes are present in the lumbar spine. Chronic L1 compression deformity. IMPRESSION: 1. Abdominal aortic aneurysm measuring 3.1 cm. 2. Right lower extremity: Occlusion of the right common iliac artery measuring approximately 4 cm in length. Reconstitution of the distal right common iliac artery. Mild outflow disease. Focal moderate stenosis in the right anterior tibial artery. The peroneal and posterior tibial arteries are patent. 3. Left lower extremity: Mild to moderate disease is present in the proximal left common iliac artery. The left external iliac artery is patent. Patent outflow with mild disease. Focal moderate stenosis in the distal left posterior tibial  artery. The anterior tibial and peroneal arteries are patent. Electronically Signed   By: Maude Naegeli M.D.   On: 01/20/2024 08:07   VAS US  ABI WITH/WO TBI Result Date: 01/19/2024  LOWER EXTREMITY DOPPLER STUDY Patient Name:  SAMAIA IWATA  Date of Exam:   01/19/2024 Medical Rec #: [id number]         Accession #:    [id number] Date of Birth: 1960/10/01         Patient Gender: F Patient Age:   48 years Exam Location:  [facility] Procedure:      VAS US  ABI WITH/WO TBI Referring Phys: TERETHA DAMME --------------------------------------------------------------------------------  Indications: Peripheral artery disease. Present with worsening erythema and pai              in her fight foot and worsening necrotic changes in the 2nd right              toe. High Risk Factors: Hypertension, hyperlipidemia, Diabetes, past history of                     smoking, coronary artery disease. Other Factors: History of bilateral common iliac arteries 80% stenosis on CTA on                09/06/23. No intervention performed.  Performing Technologist: Ricka Sturdivant-Jones RDMS, RVT  Examination Guidelines: A complete evaluation includes at minimum, Doppler waveform signals and systolic blood pressure reading at the level of bilateral brachial, anterior tibial, and posterior tibial arteries, when vessel segments are accessible. Bilateral testing is considered an integral part of a complete examination. Photoelectric Plethysmograph (PPG) waveforms and toe systolic pressure readings are included as required and additional duplex testing as needed. Limited examinations for reoccurring indications may be performed as noted.  ABI Findings: +---------+------------------+-----+----------+--------+ Right    Rt Pressure (mmHg)IndexWaveform  Comment  +---------+------------------+-----+----------+--------+ Brachial 133                    triphasic          +---------+------------------+-----+----------+--------+ PTA      58                0.42 monophasic         +---------+------------------+-----+----------+--------+ DP       55                0.40 monophasic         +---------+------------------+-----+----------+--------+ Great Toe                       Absent             +---------+------------------+-----+----------+--------+ +---------+------------------+-----+---------+-------+ Left     Lt Pressure (mmHg)IndexWaveform Comment +---------+------------------+-----+---------+-------+ Brachial 137                    triphasic        +---------+------------------+-----+---------+-------+ PTA      120               0.88 biphasic         +---------+------------------+-----+---------+-------+ DP       115               0.84 biphasic         +---------+------------------+-----+---------+-------+ Great Toe96                0.70  Normal           +---------+------------------+-----+---------+-------+ +-------+-----------+-----------+------------+------------+ ABI/TBIToday's ABIToday's  TBIPrevious ABIPrevious TBI +-------+-----------+-----------+------------+------------+ Right  0.42       absent     0.69        0.72         +-------+-----------+-----------+------------+------------+ Left   0.84       0.70       0.99        0.85         +-------+-----------+-----------+------------+------------+ Bilateral ABIs and TBIs appear decreased compared to prior study on 06/26/23.  Summary: Right: Resting right ankle-brachial index indicates severe right lower extremity arterial disease.  Left: Resting left ankle-brachial index indicates mild left lower extremity arterial disease. The left toe-brachial index is normal.  *See table(s) above for measurements and observations.  Electronically signed by Penne Colorado MD on 01/19/2024 at 6:03:38 PM.    Final    VAS US  LOWER EXTREMITY ARTERIAL DUPLEX Result Date: 01/19/2024 LOWER EXTREMITY ARTERIAL DUPLEX STUDY Patient Name:  TELIYAH ROYAL  Date of Exam:   01/19/2024 Medical Rec #: [id number]         Accession #:    [id number] Date of Birth: 11-30-1960         Patient Gender: F Patient Age:   35 years Exam Location:  [facility] Procedure:      VAS US  LOWER EXTREMITY ARTERIAL DUPLEX Referring Phys: TERETHA DAMME --------------------------------------------------------------------------------  Indications: Peripheral artery disease, and presenting with worsening erythema              and pain in her right foot and worsening necrotic changes in the              2nd toe. High Risk Factors: Hypertension, hyperlipidemia, Diabetes, past history of                    smoking, coronary artery disease. Other Factors: COPD, GERD                Bilateral CIA 80% stenosis on 09/06/23 arteriogram. No intervention                performed.  Current ABI: Right 0.42, Left 0.84 Performing  Technologist: Ricka Sturdivant-Jones RDMS, RVT  Examination Guidelines: A complete evaluation includes B-mode imaging, spectral Doppler, color Doppler, and power Doppler as needed of all accessible portions of each vessel. Bilateral testing is considered an integral part of a complete examination. Limited examinations for reoccurring indications may be performed as noted.  +-----------+--------+-----+---------------+-------------------+--------+ RIGHT      PSV cm/sRatioStenosis       Waveform           Comments +-----------+--------+-----+---------------+-------------------+--------+ CIA Prox   360          75-99% stenosis                            +-----------+--------+-----+---------------+-------------------+--------+ EIA Mid    48                          monophasic                  +-----------+--------+-----+---------------+-------------------+--------+ CFA Mid    50                          monophasic                  +-----------+--------+-----+---------------+-------------------+--------+ DFA        25  monophasic                  +-----------+--------+-----+---------------+-------------------+--------+ SFA Prox   40                          monophasic                  +-----------+--------+-----+---------------+-------------------+--------+ SFA Mid    48                          monophasic                  +-----------+--------+-----+---------------+-------------------+--------+ SFA Distal 31                          monophasic                  +-----------+--------+-----+---------------+-------------------+--------+ POP Prox   30                          monophasic                  +-----------+--------+-----+---------------+-------------------+--------+ POP Distal 29                          monophasic                  +-----------+--------+-----+---------------+-------------------+--------+ ATA Prox   27                           monophasic                  +-----------+--------+-----+---------------+-------------------+--------+ ATA Mid    20                          monophasic                  +-----------+--------+-----+---------------+-------------------+--------+ ATA Distal 18                          monophasic                  +-----------+--------+-----+---------------+-------------------+--------+ PTA Prox   28                          monophasic                  +-----------+--------+-----+---------------+-------------------+--------+ PTA Mid    26                          monophasic                  +-----------+--------+-----+---------------+-------------------+--------+ PTA Distal 27                          monophasic                  +-----------+--------+-----+---------------+-------------------+--------+ PERO Mid   19                          monophasic                  +-----------+--------+-----+---------------+-------------------+--------+  PERO Distal19                          monophasic                  +-----------+--------+-----+---------------+-------------------+--------+ DP         14                          dampened monophasic         +-----------+--------+-----+---------------+-------------------+--------+  Summary: Right: 75-99% stenosis in the right common iliac artery. No significant stenosis noted distally.  See table(s) above for measurements and observations. Electronically signed by Penne Colorado MD on 01/19/2024 at 6:02:56 PM.    Final       Vernal Alstrom, MD  [facility] 01/21/2024  If 7PM-7AM, please contact night-coverage

## 2024-01-21 NOTE — Progress Notes (Signed)
  Progress Note    01/21/2024 10:14 AM * No surgery found *  Subjective: No overnight issues   Vitals:   01/21/24 0326 01/21/24 0831  BP: 121/81   Pulse: 62 74  Resp: 19 17  Temp: 98 F (36.7 C)   SpO2: 99% 94%    Physical Exam: Awake alert and oriented Nonlabored respirations Abdomen is soft and nontender Stable gangrenous changes right second toe  CBC    Component Value Date/Time   WBC 8.6 01/21/2024 0712   RBC 3.37 (L) 01/21/2024 0712   HGB 11.6 (L) 01/21/2024 0712   HGB 14.0 12/20/2023 1104   HCT 34.8 (L) 01/21/2024 0712   HCT 42.0 12/20/2023 1104   PLT 288 01/21/2024 0712   PLT 213 12/20/2023 1104   MCV 103.3 (H) 01/21/2024 0712   MCV 106 (H) 12/20/2023 1104   MCH 34.4 (H) 01/21/2024 0712   MCHC 33.3 01/21/2024 0712   RDW 12.8 01/21/2024 0712   RDW 13.2 12/20/2023 1104   LYMPHSABS 1.0 12/20/2023 1104   MONOABS 0.7 08/16/2023 0952   EOSABS 0.0 12/20/2023 1104   BASOSABS 0.0 12/20/2023 1104    BMET    Component Value Date/Time   NA 136 01/21/2024 0712   NA 140 12/20/2023 1104   K 4.2 01/21/2024 0712   CL 102 01/21/2024 0712   CO2 23 01/21/2024 0712   GLUCOSE 183 (H) 01/21/2024 0712   BUN 8 01/21/2024 0712   BUN 7 (L) 12/20/2023 1104   CREATININE 0.73 01/21/2024 0712   CALCIUM 8.5 (L) 01/21/2024 0712   GFRNONAA >60 01/21/2024 0712    INR No results found for: INR   Intake/Output Summary (Last 24 hours) at 01/21/2024 1014 Last data filed at 01/20/2024 1903 Gross per 24 hour  Intake 515.44 ml  Output --  Net 515.44 ml     Assessment/plan:  63 y.o. female is here with gangrenous changes right second toe.  I reviewed her CT scan in depth and discussed the findings with her today.  Plan will be for OR on Tuesday with possible aortic and bilateral common iliac artery stenting from a bilateral common femoral approach versus aorto uni iliac aneurysm repair with left to right femorofemoral bypass.  I discussed with the patient that this is to  reestablish circulation to the right foot that she will in all likelihood require toe amputation and that she remains high risk for lower extremity major amputation.  We discussed her need for urgent smoking cessation as well as diligent control of her blood sugar with elevated A1c.  All of her questions were answered and we will revisit tomorrow and she will need to be n.p.o. past midnight Monday.   Lawrance Wiedemann C. Sheree, MD [facility] Office: [phone] Pager: [phone]  01/21/2024 10:14 AM

## 2024-01-22 ENCOUNTER — Other Ambulatory Visit (HOSPITAL_COMMUNITY): Payer: Self-pay

## 2024-01-22 ENCOUNTER — Telehealth (HOSPITAL_COMMUNITY): Payer: Self-pay

## 2024-01-22 DIAGNOSIS — L03031 Cellulitis of right toe: Secondary | ICD-10-CM | POA: Diagnosis not present

## 2024-01-22 LAB — CBC
HCT: 35.4 % — ABNORMAL LOW (ref 36.0–46.0)
Hemoglobin: 11.6 g/dL — ABNORMAL LOW (ref 12.0–15.0)
MCH: 34.3 pg — ABNORMAL HIGH (ref 26.0–34.0)
MCHC: 32.8 g/dL (ref 30.0–36.0)
MCV: 104.7 fL — ABNORMAL HIGH (ref 80.0–100.0)
Platelets: 302 K/uL (ref 150–400)
RBC: 3.38 MIL/uL — ABNORMAL LOW (ref 3.87–5.11)
RDW: 12.7 % (ref 11.5–15.5)
WBC: 8 K/uL (ref 4.0–10.5)
nRBC: 0 % (ref 0.0–0.2)

## 2024-01-22 LAB — BASIC METABOLIC PANEL WITH GFR
Anion gap: 9 (ref 5–15)
BUN: 8 mg/dL (ref 8–23)
CO2: 26 mmol/L (ref 22–32)
Calcium: 8.8 mg/dL — ABNORMAL LOW (ref 8.9–10.3)
Chloride: 100 mmol/L (ref 98–111)
Creatinine, Ser: 0.81 mg/dL (ref 0.44–1.00)
GFR, Estimated: >60 mL/min (ref >60)
Glucose, Bld: 203 mg/dL — ABNORMAL HIGH (ref 70–99)
Potassium: 4.2 mmol/L (ref 3.5–5.1)
Sodium: 135 mmol/L (ref 135–145)

## 2024-01-22 LAB — ABO/RH: ABO/RH(D): A POS

## 2024-01-22 LAB — GLUCOSE, CAPILLARY
Glucose-Capillary: 202 mg/dL — ABNORMAL HIGH (ref 70–99)
Glucose-Capillary: 294 mg/dL — ABNORMAL HIGH (ref 70–99)
Glucose-Capillary: 119 mg/dL — ABNORMAL HIGH (ref 70–99)
Glucose-Capillary: 212 mg/dL — ABNORMAL HIGH (ref 70–99)
Glucose-Capillary: 137 mg/dL — ABNORMAL HIGH (ref 70–99)

## 2024-01-22 MED ORDER — CHLORHEXIDINE GLUCONATE 0.12 % MT SOLN
15.0000 mL | Freq: Once | OROMUCOSAL | Status: AC
  Administered 2024-01-23: 15 mL via OROMUCOSAL
  Filled 2024-01-22: qty 15

## 2024-01-22 MED ORDER — ORAL CARE MOUTH RINSE: 15.0000 mL | Freq: Once | OROMUCOSAL | Status: AC

## 2024-01-22 MED ORDER — LACTATED RINGERS IV SOLN: INTRAVENOUS | Status: DC

## 2024-01-22 NOTE — Progress Notes (Signed)
--------  [facility] PROCEDURAL EXPEDITER NOTE--------------- Patient Name: Brandy Hall Patient DOB: Mar 19, 1960 Today's Date: 01/22/24   Chart reviewed: Yes  Documentation gaps: N Orders in place:  Yes  Communication with surgical team if no orders: N/A Labs, test, and orders reviewed: Y - Anesthesia orders placed and T&S released Requires surgical clearance:  No Patient status: IP   Barriers noted: N  Intervention provided by [facility] team: N/A Barrier resolved: N/A   Devere Penner, RN  [facility]

## 2024-01-22 NOTE — Plan of Care (Signed)
  Problem: Activity: Goal: Risk for activity intolerance will decrease Outcome: Progressing   Problem: Coping: Goal: Level of anxiety will decrease Outcome: Progressing   Problem: Pain Managment: Goal: General experience of comfort will improve and/or be controlled Outcome: Progressing   Problem: Safety: Goal: Ability to remain free from injury will improve Outcome: Progressing

## 2024-01-22 NOTE — Progress Notes (Signed)
 PROGRESS NOTE  Brandy Hall FMW:[id number] DOB: 03/01/1961 DOA: 01/19/2024 PCP: Marelyn Quill, MD   LOS: 3 days   Brief narrative:  Brandy Hall is a 63 y.o. female with medical history significant of diabetes mellitus 2, CAD with prior MI, peripheral vascular disease, COPD, ongoing tobacco abuse, hypertension, and dyslipidemia who had been following with podiatrist Dr. Loel in [place] presented to hospital with  pain in the foot for 2 weeks worsening over the last 5 to 7 days with blackish discoloration of fourth second toe with difficulty ambulating.  In the ED, patient was hypotensive with blood pressure of 137/1.  Initial labs were notable for blood glucose levels at 432, hemoglobin A1c of 8.7, magnesium 1.6.  Patient did not have leukocytosis.  Sodium was low at 130 with potassium of 3.4.  Patient was then admitted to hospital for further evaluation and treatment.   Assessment/Plan: Principal Problem:   Cellulitis of right toe Active Problems:   Gangrene of toe of right foot (HCC)  Cellulitis right toe/foot in patient with diabetes mellitus 2 Severe peripheral vascular disease with critical limb ischemia of the right second toe with necrosis Podiatry and vascular surgery on board. Previous aortogram revealed 80% stenosis of the common iliac arteries bilaterally with associated small vessel disease below the knee.  Lower extremity arterial duplex with ABI showed severe right lower extremity arterial disease and mild left lower extremity arterial disease. Vascular surgery recommended  CT angiogram abdomen and pelvis with runoff to evaluate distal aorta and iliac arteries as well as repeat angiogram to evaluate for right lower extremity perfusion with findings of abdominal aortic aneurysm of 3.1 cm with occlusion of the right common Ilac artery with reconstitution fusion of distal right common femoral artery with focal stenosis in the right anterior tibial artery.  Plan for  angiogram on 11/17 with Dr. Sheree.  Empirically on antibiotics with vancomycin and Zosyn for cellulitis and will pain control.  Continue aspirin  Plavix, ezetimibe .    Mild hypokalemia.  Improved after replacement.  Latest potassium of 4.2.  Hypomagnesemia.  Improved.  Latest magnesium of 2.0  Neuropathic pain, Has been started on Lyrica 50 mg 3 times daily.   Diabetes mellitus 2 Continue glargine and sliding scale insulin .  Hemoglobin A1c of 8.7.  Diabetic diet.  Latest POC glucose of 294  CAD/history of previous MI Continue Imdur  Ranexa aspirin  Plavix and Zetia .  No acute issues.   Hypertension Continue lisinopril , hydralazine  and Norvasc .    Continue with holding parameters.   COPD/tobacco use Continue DuoNeb and home inhalers.  Currently stable.  H patient states that she is supposed to be on Chantix as outpatient..  Continue nicotine patch and gums while in the hospital.  Encouraged going back on Chantix at home.  DVT prophylaxis: enoxaparin (LOVENOX) injection 40 mg Start: 01/19/24 1030   Disposition: Home likely in 2 to 3 days  Status is: Inpatient  Remains inpatient appropriate because: Need for vascular/podiatry intervention, pending clinical improvement    Code Status:     Code Status: Full Code  Family Communication: None at bedside  Consultants: Neurosurgery Podiatry  Procedures: ABI  Anti-infectives:  Vancomycin and Zosyn IV  Anti-infectives (From admission, onward)    Start     Dose/Rate Route Frequency Ordered Stop   01/20/24 0400  vancomycin (VANCOREADY) IVPB 750 mg/150 mL        750 mg 150 mL/hr over 60 Minutes Intravenous Every 12 hours 01/19/24 1034     01/19/24 1130  piperacillin-tazobactam (ZOSYN) IVPB 3.375 g        3.375 g 12.5 mL/hr over 240 Minutes Intravenous Every 8 hours 01/19/24 1019     01/19/24 1130  vancomycin (VANCOREADY) IVPB 1500 mg/300 mL        1,500 mg 150 mL/hr over 120 Minutes Intravenous  Once 01/19/24 1019 01/19/24 1717         Subjective: Today, patient was seen and examined at bedside.  Patient is still complains of pain in the left foot and awaiting for vascular intervention likely tomorrow.  Denies any shortness of breath nausea vomiting chills or fever.  Has had bowel movements.  Objective: Vitals:   01/22/24 0356 01/22/24 0729  BP: 139/71 (!) 152/82  Pulse: 65   Resp: 18 18  Temp: 98 F (36.7 C) 97.7 F (36.5 C)  SpO2: 99%     Intake/Output Summary (Last 24 hours) at 01/22/2024 0845 Last data filed at 01/21/2024 1252 Gross per 24 hour  Intake 657.29 ml  Output --  Net 657.29 ml   Filed Weights   01/19/24 0329  Weight: 80.2 kg   Body mass index is 26.88 kg/m.   Physical Exam:  General:  Average built, not in obvious distress HENT:   No scleral pallor or icterus noted. Oral mucosa is moist.  Chest:  Clear breath sounds.  Diminished breath sounds bilaterally. No crackles or wheezes.  CVS: S1 &S2 heard. No murmur.  Regular rate and rhythm. Abdomen: Soft, nontender, nondistended.  Bowel sounds are heard.   Extremities: Right second toe with gangrenous changes.  Erythema noted over the right foot. Psych: Alert, awake and oriented, normal mood CNS:  No cranial nerve deficits.  Power equal in all extremities.   Skin: Warm and dry.  Right foot with erythema, gangrene second toe.   Data Review: I have personally reviewed the following laboratory data and studies,  CBC: Recent Labs  Lab 01/19/24 0648 01/20/24 0416 01/21/24 0712 01/22/24 0440  WBC 7.6 15.1* 8.6 8.0  HGB 13.4 12.2 11.6* 11.6*  HCT 39.2 36.0 34.8* 35.4*  MCV 100.8* 102.3* 103.3* 104.7*  PLT 266 294 288 302   Basic Metabolic Panel: Recent Labs  Lab 01/19/24 0648 01/20/24 0416 01/21/24 0712 01/22/24 0440  NA 130* 135 136 135  K 3.4* 3.3* 4.2 4.2  CL 94* 98 102 100  CO2 24 26 23 26   GLUCOSE 432* 219* 183* 203*  BUN 8 11 8 8   CREATININE 0.82 0.74 0.73 0.81  CALCIUM 8.6* 9.1 8.5* 8.8*  MG 1.6*  --  2.0   --   PHOS  --   --  2.8  --    Liver Function Tests: Recent Labs  Lab 01/19/24 0648 01/20/24 0416  AST 13* 11*  ALT 8 5  ALKPHOS 78 62  BILITOT 0.6 0.4  PROT 6.8 6.1*  ALBUMIN 2.6* 2.3*   No results for input(s): LIPASE, AMYLASE in the last 168 hours. No results for input(s): AMMONIA in the last 168 hours. Cardiac Enzymes: No results for input(s): CKTOTAL, CKMB, CKMBINDEX, TROPONINI in the last 168 hours. BNP (last 3 results) No results for input(s): BNP in the last 8760 hours.  ProBNP (last 3 results) No results for input(s): PROBNP in the last 8760 hours.  CBG: Recent Labs  Lab 01/21/24 1211 01/21/24 1605 01/21/24 2112 01/22/24 0409 01/22/24 0728  GLUCAP 216* 206* 191* 202* 294*   No results found for this or any previous visit (from the past 240 hours).  Studies: No results found.     Vernal Alstrom, MD  [facility] 01/22/2024  If 7PM-7AM, please contact night-coverage

## 2024-01-22 NOTE — Inpatient Diabetes Management (Signed)
 Inpatient Diabetes Program Recommendations  AACE/ADA: New Consensus Statement on Inpatient Glycemic Control  Target Ranges:  Prepandial:   less than 140 mg/dL      Peak postprandial:   less than 180 mg/dL (1-2 hours)      Critically ill patients:  140 - 180 mg/dL   Lab Results  Component Value Date   GLUCAP 294 (H) 01/22/2024   HGBA1C 8.7 (H) 01/19/2024    Latest Reference Range & Units 01/21/24 06:03 01/21/24 08:26 01/21/24 12:11 01/21/24 16:05 01/21/24 21:12 01/22/24 04:09 01/22/24 07:28  Glucose-Capillary 70 - 99 mg/dL 816 (H) 879 (H) 783 (H) 206 (H) 191 (H) 202 (H) 294 (H)   Review of Glycemic Control  Diabetes history: DM2  Outpatient Diabetes medications:  Lantus 24 units daily  Novolog  0-2 units TID  Current orders for Inpatient glycemic control:  Semglee 24 units daily  Novolog  0-15 units TID + 0-5 at bedtime   Inpatient Diabetes Program Recommendations:   Please consider adding Novolog  3 units TID with meals (if patient consumes atleast 50% of meal).   Thanks, Lavanda Search, RN, MSN, [facility]  Inpatient Diabetes Coordinator  Pager [phone] (8a-5p)

## 2024-01-22 NOTE — Progress Notes (Signed)
   PODIATRY PROGRESS NOTE Patient Name: Brandy Hall  DOB 08/30/1960 DOA 01/19/2024  Hospital Day: 4  Assessment:  63 y.o. 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    A1c 8.7 XR right foot 1. Prominent diffuse soft tissue swelling throughout the foot. No focal bony abnormality.     Plan:  - Plan for surgery with vascular team tomorrow. Will follow up with her Wednesday and determine if any digit amputation is recommended this admission vs allowing further demarcation as outpatient.  - Recommend starting IV abx broad spectrum pending further culture data - Anticoagulation: Okay to continue - Wound care: Betadine paint to the distal second toe - WB status: Weightbearing as tolerated right foot - Will continue to follow        Brandy Hall, DPM [facility]    Subjective:  Discussed plans to revisit after her vascular procedure tomorrow to determine if intervention is recommend this admission vs waiting for further demarcation.   Objective:   Vitals:   01/22/24 0356 01/22/24 0729  BP: 139/71 (!) 152/82  Pulse: 65   Resp: 18 18  Temp: 98 F (36.7 C) 97.7 F (36.5 C)  SpO2: 99%        Latest Ref Rng & Units 01/22/2024    4:40 AM 01/21/2024    7:12 AM 01/20/2024    4:16 AM  CBC  WBC 4.0 - 10.5 K/uL 8.0  8.6  15.1   Hemoglobin 12.0 - 15.0 g/dL 88.3  88.3  87.7   Hematocrit 36.0 - 46.0 % 35.4  34.8  36.0   Platelets 150 - 400 K/uL 302  288  294        Latest Ref Rng & Units 01/22/2024    4:40 AM 01/21/2024    7:12 AM 01/20/2024    4:16 AM  BMP  Glucose 70 - 99 mg/dL 796  816  780   BUN 8 - 23 mg/dL 8  8  11    Creatinine 0.44 - 1.00 mg/dL 9.18  9.26  9.25   Sodium 135 - 145 mmol/L 135  136  135   Potassium 3.5 - 5.1 mmol/L 4.2  4.2  3.3   Chloride 98 - 111 mmol/L 100  102  98   CO2 22 - 32 mmol/L 26  23  26     Calcium 8.9 - 10.3 mg/dL 8.8  8.5  9.1     General: AAOx3, NAD  Lower Extremity Exam Right foot nonpalpable DP PT pulses  Edema right foot   Erythema decreased from prior   Significantly tender to palpation to the right forefoot   Dry gangrenous changes to the distal aspect of the second toe on the right foot  Also with some worsening necrotic changes to the plantar distal hallux right foot     Radiology:  Results reviewed. See assessment for pertinent imaging results

## 2024-01-22 NOTE — Telephone Encounter (Signed)
 Pharmacy Patient Advocate Encounter  Insurance verification completed.    The patient is insured through [employer]. Patient has Medicare and is not eligible for a copay card, but may be able to apply for patient assistance or Medicare RX Payment Plan (Patient Must reach out to their plan, if eligible for payment plan), if available.    Ran test claim for Breztri  [id number] and the current 30 day co-pay is $40.   This test claim was processed through [facility]- copay amounts may vary at other pharmacies due to pharmacy/plan contracts, or as the patient moves through the different stages of their insurance plan.

## 2024-01-22 NOTE — Progress Notes (Addendum)
  Progress Note    01/22/2024 10:16 AM  Subjective:  no complaints   Vitals:   01/22/24 0356 01/22/24 0729  BP: 139/71 (!) 152/82  Pulse: 65   Resp: 18 18  Temp: 98 F (36.7 C) 97.7 F (36.5 C)  SpO2: 99%    Physical Exam: Lungs:  non labored Extremities:  R 2nd toe amp Neurologic: A&O  CBC    Component Value Date/Time   WBC 8.0 01/22/2024 0440   RBC 3.38 (L) 01/22/2024 0440   HGB 11.6 (L) 01/22/2024 0440   HGB 14.0 12/20/2023 1104   HCT 35.4 (L) 01/22/2024 0440   HCT 42.0 12/20/2023 1104   PLT 302 01/22/2024 0440   PLT 213 12/20/2023 1104   MCV 104.7 (H) 01/22/2024 0440   MCV 106 (H) 12/20/2023 1104   MCH 34.3 (H) 01/22/2024 0440   MCHC 32.8 01/22/2024 0440   RDW 12.7 01/22/2024 0440   RDW 13.2 12/20/2023 1104   LYMPHSABS 1.0 12/20/2023 1104   MONOABS 0.7 08/16/2023 0952   EOSABS 0.0 12/20/2023 1104   BASOSABS 0.0 12/20/2023 1104    BMET    Component Value Date/Time   NA 135 01/22/2024 0440   NA 140 12/20/2023 1104   K 4.2 01/22/2024 0440   CL 100 01/22/2024 0440   CO2 26 01/22/2024 0440   GLUCOSE 203 (H) 01/22/2024 0440   BUN 8 01/22/2024 0440   BUN 7 (L) 12/20/2023 1104   CREATININE 0.81 01/22/2024 0440   CALCIUM 8.8 (L) 01/22/2024 0440   GFRNONAA >60 01/22/2024 0440    INR No results found for: INR   Intake/Output Summary (Last 24 hours) at 01/22/2024 1016 Last data filed at 01/22/2024 0856 Gross per 24 hour  Intake 600 ml  Output --  Net 600 ml     Assessment/Plan:  63 y.o. female with R 2nd toe gangrene  Plan is for aortic and bilateral common iliac artery stenting  versus AUI with L to R femoral to femoral bypass tomorrow with Dr. Sheree.  She is aware she may also require R 2nd toe amputation.  Case was discussed in detail with the patient and she is willing to proceed.  NPO past midnight.     Donnice Sender, PA-C [facility] [phone] 01/22/2024 10:16 AM   I have independently interviewed and  examined patient and agree with PA assessment and plan above.  We again discussed procedural details aortic and bilateral iliac artery stenting versus AUI with left to right femoral bypass.  She will then need evaluation of her left second toe by podiatry after revascularization.  We again discussed the risk benefits and alternatives and she will be n.p.o. past midnight.  Ceili Boshers C. Sheree, MD [facility] [place] Office: [phone] Pager: [phone]

## 2024-01-23 ENCOUNTER — Encounter (HOSPITAL_COMMUNITY): Payer: Self-pay | Admitting: Internal Medicine

## 2024-01-23 ENCOUNTER — Encounter (HOSPITAL_COMMUNITY): Disposition: A | Payer: Self-pay | Source: Other Acute Inpatient Hospital | Attending: Internal Medicine

## 2024-01-23 ENCOUNTER — Inpatient Hospital Stay (HOSPITAL_COMMUNITY)

## 2024-01-23 ENCOUNTER — Inpatient Hospital Stay (HOSPITAL_COMMUNITY): Payer: Self-pay | Admitting: Certified Registered Nurse Anesthetist

## 2024-01-23 DIAGNOSIS — I70261 Atherosclerosis of native arteries of extremities with gangrene, right leg: Secondary | ICD-10-CM

## 2024-01-23 DIAGNOSIS — L03031 Cellulitis of right toe: Secondary | ICD-10-CM | POA: Diagnosis not present

## 2024-01-23 DIAGNOSIS — I2510 Atherosclerotic heart disease of native coronary artery without angina pectoris: Secondary | ICD-10-CM | POA: Diagnosis not present

## 2024-01-23 DIAGNOSIS — F17210 Nicotine dependence, cigarettes, uncomplicated: Secondary | ICD-10-CM

## 2024-01-23 DIAGNOSIS — I700 Atherosclerosis of aorta: Secondary | ICD-10-CM

## 2024-01-23 DIAGNOSIS — I70202 Unspecified atherosclerosis of native arteries of extremities, left leg: Secondary | ICD-10-CM | POA: Diagnosis not present

## 2024-01-23 HISTORY — PX: ULTRASOUND GUIDANCE FOR VASCULAR ACCESS: SHX6516

## 2024-01-23 HISTORY — PX: INSERTION OF ILIAC STENT: SHX6256

## 2024-01-23 LAB — CBC
HCT: 35.8 % — ABNORMAL LOW (ref 36.0–46.0)
Hemoglobin: 11.7 g/dL — ABNORMAL LOW (ref 12.0–15.0)
MCH: 34.4 pg — ABNORMAL HIGH (ref 26.0–34.0)
MCHC: 32.7 g/dL (ref 30.0–36.0)
MCV: 105.3 fL — ABNORMAL HIGH (ref 80.0–100.0)
Platelets: 326 K/uL (ref 150–400)
RBC: 3.4 MIL/uL — ABNORMAL LOW (ref 3.87–5.11)
RDW: 12.6 % (ref 11.5–15.5)
WBC: 7.4 K/uL (ref 4.0–10.5)
nRBC: 0 % (ref 0.0–0.2)

## 2024-01-23 LAB — POCT I-STAT 7, (LYTES, BLD GAS, ICA,H+H)
Acid-Base Excess: 3 mmol/L — ABNORMAL HIGH (ref 0.0–2.0)
Bicarbonate: 27.1 mmol/L (ref 20.0–28.0)
Calcium, Ion: 1.27 mmol/L (ref 1.15–1.40)
HCT: 34 % — ABNORMAL LOW (ref 36.0–46.0)
Hemoglobin: 11.6 g/dL — ABNORMAL LOW (ref 12.0–15.0)
O2 Saturation: 97 %
Patient temperature: 35.9
Potassium: 3.5 mmol/L (ref 3.5–5.1)
Sodium: 142 mmol/L (ref 135–145)
TCO2: 28 mmol/L (ref 22–32)
pCO2 arterial: 37.9 mmHg (ref 32–48)
pH, Arterial: 7.459 — ABNORMAL HIGH (ref 7.35–7.45)
pO2, Arterial: 85 mmHg (ref 83–108)

## 2024-01-23 LAB — GLUCOSE, CAPILLARY
Glucose-Capillary: 259 mg/dL — ABNORMAL HIGH (ref 70–99)
Glucose-Capillary: 141 mg/dL — ABNORMAL HIGH (ref 70–99)
Glucose-Capillary: 115 mg/dL — ABNORMAL HIGH (ref 70–99)
Glucose-Capillary: 103 mg/dL — ABNORMAL HIGH (ref 70–99)
Glucose-Capillary: 80 mg/dL (ref 70–99)
Glucose-Capillary: 189 mg/dL — ABNORMAL HIGH (ref 70–99)

## 2024-01-23 LAB — BASIC METABOLIC PANEL WITH GFR
Anion gap: 10 (ref 5–15)
BUN: 6 mg/dL — ABNORMAL LOW (ref 8–23)
CO2: 26 mmol/L (ref 22–32)
Calcium: 8.8 mg/dL — ABNORMAL LOW (ref 8.9–10.3)
Chloride: 99 mmol/L (ref 98–111)
Creatinine, Ser: 0.72 mg/dL (ref 0.44–1.00)
GFR, Estimated: >60 mL/min (ref >60)
Glucose, Bld: 270 mg/dL — ABNORMAL HIGH (ref 70–99)
Potassium: 4 mmol/L (ref 3.5–5.1)
Sodium: 135 mmol/L (ref 135–145)

## 2024-01-23 LAB — SURGICAL PCR SCREEN
MRSA, PCR: NEGATIVE
Staphylococcus aureus: NEGATIVE

## 2024-01-23 LAB — PREPARE RBC (CROSSMATCH)

## 2024-01-23 LAB — POCT ACTIVATED CLOTTING TIME
Activated Clotting Time: 239 s
Activated Clotting Time: 262 s

## 2024-01-23 LAB — PHOSPHORUS: Phosphorus: 2.6 mg/dL (ref 2.5–4.6)

## 2024-01-23 LAB — MAGNESIUM: Magnesium: 1.9 mg/dL (ref 1.7–2.4)

## 2024-01-23 SURGERY — INSERTION, STENT, ARTERY, ILIAC
Anesthesia: General | Site: Groin | Laterality: Bilateral

## 2024-01-23 MED ORDER — ACETAMINOPHEN 10 MG/ML IV SOLN
INTRAVENOUS | Status: AC
  Filled 2024-01-23: qty 100

## 2024-01-23 MED ORDER — HEPARIN SODIUM (PORCINE) 1000 UNIT/ML IJ SOLN
INTRAMUSCULAR | Status: DC | PRN
  Administered 2024-01-23 (×2): 5000 [IU] via INTRAVENOUS

## 2024-01-23 MED ORDER — HEPARIN SODIUM (PORCINE) 5000 UNIT/ML IJ SOLN
5000.0000 [IU] | Freq: Three times a day (TID) | INTRAMUSCULAR | Status: DC
  Administered 2024-01-24: 5000 [IU] via SUBCUTANEOUS
  Filled 2024-01-23: qty 1

## 2024-01-23 MED ORDER — ROCURONIUM BROMIDE 10 MG/ML (PF) SYRINGE
PREFILLED_SYRINGE | INTRAVENOUS | Status: DC | PRN
  Administered 2024-01-23: 70 mg via INTRAVENOUS

## 2024-01-23 MED ORDER — ONDANSETRON HCL 4 MG/2ML IJ SOLN
INTRAMUSCULAR | Status: AC
Start: 2024-01-23 — End: 2024-01-23
  Filled 2024-01-23: qty 2

## 2024-01-23 MED ORDER — ACETAMINOPHEN 10 MG/ML IV SOLN
INTRAVENOUS | Status: DC | PRN
  Administered 2024-01-23: 1000 mg via INTRAVENOUS

## 2024-01-23 MED ORDER — SODIUM CHLORIDE 0.9 % IV SOLN: INTRAVENOUS | Status: AC

## 2024-01-23 MED ORDER — HEPARIN 6000 UNIT IRRIGATION SOLUTION
Status: AC
  Filled 2024-01-23: qty 500

## 2024-01-23 MED ORDER — ALBUTEROL SULFATE HFA 108 (90 BASE) MCG/ACT IN AERS
INHALATION_SPRAY | RESPIRATORY_TRACT | Status: DC | PRN
  Administered 2024-01-23 (×2): 8 via RESPIRATORY_TRACT

## 2024-01-23 MED ORDER — HYDROMORPHONE HCL 1 MG/ML IJ SOLN
INTRAMUSCULAR | Status: AC
  Filled 2024-01-23: qty 1

## 2024-01-23 MED ORDER — IPRATROPIUM-ALBUTEROL 0.5-2.5 (3) MG/3ML IN SOLN
3.0000 mL | Freq: Once | RESPIRATORY_TRACT | Status: AC
  Administered 2024-01-23: 3 mL via RESPIRATORY_TRACT

## 2024-01-23 MED ORDER — MIDAZOLAM HCL (PF) 2 MG/2ML IJ SOLN
INTRAMUSCULAR | Status: DC | PRN
  Administered 2024-01-23 (×2): 1 mg via INTRAVENOUS

## 2024-01-23 MED ORDER — PHENYLEPHRINE 80 MCG/ML (10ML) SYRINGE FOR IV PUSH (FOR BLOOD PRESSURE SUPPORT)
PREFILLED_SYRINGE | INTRAVENOUS | Status: DC | PRN
Start: 2024-01-23 — End: 2024-01-23
  Administered 2024-01-23 (×2): 80 ug via INTRAVENOUS

## 2024-01-23 MED ORDER — LACTATED RINGERS IV SOLN: INTRAVENOUS | Status: DC | PRN

## 2024-01-23 MED ORDER — AMISULPRIDE (ANTIEMETIC) 5 MG/2ML IV SOLN: 10.0000 mg | Freq: Once | INTRAVENOUS | Status: DC | PRN

## 2024-01-23 MED ORDER — MIDAZOLAM HCL 2 MG/2ML IJ SOLN
INTRAMUSCULAR | Status: AC
  Filled 2024-01-23: qty 2

## 2024-01-23 MED ORDER — SUGAMMADEX SODIUM 200 MG/2ML IV SOLN
INTRAVENOUS | Status: DC | PRN
  Administered 2024-01-23: 150 mg via INTRAVENOUS

## 2024-01-23 MED ORDER — PROPOFOL 10 MG/ML IV BOLUS
INTRAVENOUS | Status: AC
Start: 2024-01-23 — End: 2024-01-23
  Filled 2024-01-23: qty 20

## 2024-01-23 MED ORDER — LIDOCAINE 2% (20 MG/ML) 5 ML SYRINGE
INTRAMUSCULAR | Status: DC | PRN
  Administered 2024-01-23: 100 mg via INTRAVENOUS

## 2024-01-23 MED ORDER — FENTANYL CITRATE (PF) 250 MCG/5ML IJ SOLN
INTRAMUSCULAR | Status: AC
  Filled 2024-01-23: qty 5

## 2024-01-23 MED ORDER — ROCURONIUM BROMIDE 10 MG/ML (PF) SYRINGE
PREFILLED_SYRINGE | INTRAVENOUS | Status: AC
  Filled 2024-01-23: qty 10

## 2024-01-23 MED ORDER — POTASSIUM CHLORIDE CRYS ER 20 MEQ PO TBCR: 40.0000 meq | EXTENDED_RELEASE_TABLET | Freq: Every day | ORAL | Status: DC | PRN

## 2024-01-23 MED ORDER — ONDANSETRON HCL 4 MG/2ML IJ SOLN
INTRAMUSCULAR | Status: DC | PRN
  Administered 2024-01-23: 4 mg via INTRAVENOUS

## 2024-01-23 MED ORDER — METOPROLOL TARTRATE 5 MG/5ML IV SOLN: 2.5000 mg | INTRAVENOUS | Status: DC | PRN

## 2024-01-23 MED ORDER — PROPOFOL 10 MG/ML IV BOLUS
INTRAVENOUS | Status: DC | PRN
  Administered 2024-01-23: 90 mg via INTRAVENOUS

## 2024-01-23 MED ORDER — PANTOPRAZOLE SODIUM 40 MG PO TBEC
40.0000 mg | DELAYED_RELEASE_TABLET | Freq: Every day | ORAL | Status: DC
  Administered 2024-01-23 – 2024-01-24 (×2): 40 mg via ORAL
  Filled 2024-01-23 (×2): qty 1

## 2024-01-23 MED ORDER — 0.9 % SODIUM CHLORIDE (POUR BTL) OPTIME
TOPICAL | Status: DC | PRN
  Administered 2024-01-23: 2000 mL

## 2024-01-23 MED ORDER — IODIXANOL 320 MG/ML IV SOLN
INTRAVENOUS | Status: DC | PRN
  Administered 2024-01-23: 132.6 mL via INTRA_ARTERIAL

## 2024-01-23 MED ORDER — DOCUSATE SODIUM 100 MG PO CAPS
100.0000 mg | ORAL_CAPSULE | Freq: Every day | ORAL | Status: DC
  Filled 2024-01-23 (×2): qty 1

## 2024-01-23 MED ORDER — PHENYLEPHRINE HCL-NACL 20-0.9 MG/250ML-% IV SOLN
INTRAVENOUS | Status: DC | PRN
  Administered 2024-01-23: 40 ug/min via INTRAVENOUS

## 2024-01-23 MED ORDER — HYDROMORPHONE HCL 1 MG/ML IJ SOLN
0.2500 mg | INTRAMUSCULAR | Status: DC | PRN
  Administered 2024-01-23: 0.5 mg via INTRAVENOUS

## 2024-01-23 MED ORDER — ONDANSETRON HCL 4 MG/2ML IJ SOLN: 4.0000 mg | Freq: Once | INTRAMUSCULAR | Status: DC | PRN

## 2024-01-23 MED ORDER — SODIUM CHLORIDE 0.9 % IV SOLN: 10.0000 mL/h | Freq: Once | INTRAVENOUS | Status: AC

## 2024-01-23 MED ORDER — FENTANYL CITRATE (PF) 250 MCG/5ML IJ SOLN
INTRAMUSCULAR | Status: DC | PRN
  Administered 2024-01-23 (×3): 50 ug via INTRAVENOUS

## 2024-01-23 MED ORDER — IPRATROPIUM-ALBUTEROL 0.5-2.5 (3) MG/3ML IN SOLN
RESPIRATORY_TRACT | Status: AC
  Filled 2024-01-23: qty 3

## 2024-01-23 MED ORDER — PROTAMINE SULFATE 10 MG/ML IV SOLN
INTRAVENOUS | Status: AC
  Filled 2024-01-23: qty 5

## 2024-01-23 MED ORDER — HEPARIN 6000 UNIT IRRIGATION SOLUTION
Status: AC
Start: 2024-01-23 — End: 2024-01-23
  Filled 2024-01-23: qty 500

## 2024-01-23 MED ORDER — PHENOL 1.4 % MT LIQD
1.0000 | OROMUCOSAL | Status: DC | PRN
Start: 2024-01-23 — End: 2024-01-24

## 2024-01-23 MED ORDER — CEFAZOLIN SODIUM-DEXTROSE 2-4 GM/100ML-% IV SOLN: 2.0000 g | Freq: Three times a day (TID) | INTRAVENOUS | Status: DC

## 2024-01-23 MED ORDER — HEPARIN 6000 UNIT IRRIGATION SOLUTION
Status: DC | PRN
  Administered 2024-01-23: 1

## 2024-01-23 MED ORDER — HEPARIN SODIUM (PORCINE) 1000 UNIT/ML IJ SOLN
INTRAMUSCULAR | Status: AC
  Filled 2024-01-23: qty 10

## 2024-01-23 MED ORDER — ALBUTEROL SULFATE HFA 108 (90 BASE) MCG/ACT IN AERS
INHALATION_SPRAY | RESPIRATORY_TRACT | Status: AC
  Filled 2024-01-23: qty 6.7

## 2024-01-23 MED ORDER — LIDOCAINE 2% (20 MG/ML) 5 ML SYRINGE
INTRAMUSCULAR | Status: AC
Start: 2024-01-23 — End: 2024-01-23
  Filled 2024-01-23: qty 5

## 2024-01-23 SURGICAL SUPPLY — 64 items
BAG COUNTER SPONGE SURGICOUNT (BAG) ×4 IMPLANT
BAG SNAP BAND KOVER 36X36 (MISCELLANEOUS) IMPLANT
BALLOON ATLAS 14X40X75 (BALLOONS) IMPLANT
BALLOON COD OCL 2-9.0-35-120-3 (BALLOONS) IMPLANT
BALLOON MUSTANG 4.0X40 75 (BALLOONS) IMPLANT
BALLOON MUSTANG 6.0X40 75 (BALLOONS) IMPLANT
CANISTER SUCTION 3000ML PPV (SUCTIONS) ×4 IMPLANT
CATH BEACON 5 .035 65 KMP TIP (CATHETERS) IMPLANT
CATH CROSS OVER TEMPO 5F (CATHETERS) IMPLANT
CATH OMNI FLUSH 5F 65CM (CATHETERS) ×4 IMPLANT
CLIP LIGATING EXTRA MED SLVR (CLIP) ×4 IMPLANT
CLIP LIGATING EXTRA SM BLUE (MISCELLANEOUS) ×4 IMPLANT
CLOSURE PERCLOSE PROSTYLE (Vascular Products) IMPLANT
COVER BACK TABLE 60X90IN (DRAPES) IMPLANT
COVER BACK TABLE 80X110 HD (DRAPES) IMPLANT
COVER DOME SNAP 22 D (MISCELLANEOUS) IMPLANT
COVER MAYO STAND STRL (DRAPES) IMPLANT
COVER PROBE W GEL 5X96 (DRAPES) IMPLANT
DERMABOND ADVANCED .7 DNX12 (GAUZE/BANDAGES/DRESSINGS) ×4 IMPLANT
DEVICE TORQUE KENDALL .025-038 (MISCELLANEOUS) IMPLANT
DRAPE INCISE IOBAN 66X45 STRL (DRAPES) IMPLANT
ELECTRODE REM PT RTRN 9FT ADLT (ELECTROSURGICAL) ×4 IMPLANT
EVACUATOR SILICONE 100CC (DRAIN) IMPLANT
EXCLUDER EXT ENDO 26X4.5 15F (Endovascular Graft) IMPLANT
GLIDEWIRE ADV .035X260CM (WIRE) IMPLANT
GLOVE BIO SURGEON STRL SZ7.5 (GLOVE) ×4 IMPLANT
GOWN STRL REUS W/ TWL LRG LVL3 (GOWN DISPOSABLE) ×8 IMPLANT
GOWN STRL REUS W/ TWL XL LVL3 (GOWN DISPOSABLE) ×4 IMPLANT
GRAFT ENDO ALPHA 2 24X105 (Graft) IMPLANT
GUIDEWIRE ANGLED .035X150CM (WIRE) IMPLANT
HEMOSTAT SNOW SURGICEL 2X4 (HEMOSTASIS) IMPLANT
KIT BASIN OR (CUSTOM PROCEDURE TRAY) ×4 IMPLANT
KIT ENCORE 26 ADVANTAGE (KITS) IMPLANT
KIT TURNOVER KIT B (KITS) ×4 IMPLANT
PACK ENDO MINOR (CUSTOM PROCEDURE TRAY) ×4 IMPLANT
PACK PERIPHERAL VASCULAR (CUSTOM PROCEDURE TRAY) ×4 IMPLANT
PAD ARMBOARD POSITIONER FOAM (MISCELLANEOUS) ×8 IMPLANT
POWDER SURGICEL 3.0 GRAM (HEMOSTASIS) IMPLANT
SET MICROPUNCTURE 5F STIFF (MISCELLANEOUS) IMPLANT
SHEATH BRITE TIP 8FR 23CM (SHEATH) IMPLANT
SHEATH DRYSEAL FLEX 14FR 33CM (SHEATH) IMPLANT
SHEATH DRYSEAL FLEX 16FR 33CM (SHEATH) IMPLANT
SHEATH PINNACLE 8F 10CM (SHEATH) ×4 IMPLANT
SOLN 0.9% NACL POUR BTL 1000ML (IV SOLUTION) ×8 IMPLANT
SOLN STERILE WATER BTL 1000 ML (IV SOLUTION) ×4 IMPLANT
STENT VIABAHN 8X59 8FR 135 (Permanent Stent) IMPLANT
STOPCOCK MORSE 400PSI 3WAY (MISCELLANEOUS) IMPLANT
SUT MNCRL AB 4-0 PS2 18 (SUTURE) IMPLANT
SUT PROLENE 5 0 C 1 24 (SUTURE) ×8 IMPLANT
SUT PROLENE 6 0 BV (SUTURE) ×4 IMPLANT
SUT VIC AB 2-0 CT1 TAPERPNT 27 (SUTURE) ×8 IMPLANT
SUT VIC AB 3-0 SH 27X BRD (SUTURE) ×8 IMPLANT
SYR 10ML LL (SYRINGE) IMPLANT
SYR 20ML LL LF (SYRINGE) IMPLANT
SYR 30ML LL (SYRINGE) IMPLANT
SYR MEDRAD MARK V 150ML (SYRINGE) ×4 IMPLANT
TOWEL GREEN STERILE (TOWEL DISPOSABLE) ×4 IMPLANT
TRAY FOLEY MTR SLVR 14FR STAT (SET/KITS/TRAYS/PACK) IMPLANT
TRAY FOLEY MTR SLVR 16FR STAT (SET/KITS/TRAYS/PACK) ×4 IMPLANT
TUBING HIGH PRESSURE 120CM (CONNECTOR) ×4 IMPLANT
TUBING INJECTOR 48 (MISCELLANEOUS) IMPLANT
WIRE AMPLATZ SS-J .035X180CM (WIRE) IMPLANT
WIRE AMPLATZ SS-J .035X260CM (WIRE) IMPLANT
WIRE BENTSON .035X145CM (WIRE) ×4 IMPLANT

## 2024-01-23 NOTE — Plan of Care (Signed)
  Problem: Education: Goal: Knowledge of General Education information will improve Description: Including pain rating scale, medication(s)/side effects and non-pharmacologic comfort measures Outcome: Progressing   Problem: Health Behavior/Discharge Planning: Goal: Ability to manage health-related needs will improve Outcome: Progressing   Problem: Clinical Measurements: Goal: Ability to maintain clinical measurements within normal limits will improve Outcome: Progressing Goal: Will remain free from infection Outcome: Progressing Goal: Diagnostic test results will improve Outcome: Progressing Goal: Respiratory complications will improve Outcome: Progressing Goal: Cardiovascular complication will be avoided Outcome: Progressing   Problem: Activity: Goal: Risk for activity intolerance will decrease Outcome: Progressing   Problem: Nutrition: Goal: Adequate nutrition will be maintained Outcome: Progressing   Problem: Coping: Goal: Level of anxiety will decrease Outcome: Progressing   Problem: Elimination: Goal: Will not experience complications related to bowel motility Outcome: Progressing Goal: Will not experience complications related to urinary retention Outcome: Progressing   Problem: Pain Managment: Goal: General experience of comfort will improve and/or be controlled Outcome: Progressing   Problem: Safety: Goal: Ability to remain free from injury will improve Outcome: Progressing   Problem: Skin Integrity: Goal: Risk for impaired skin integrity will decrease Outcome: Progressing   Problem: Education: Goal: Ability to describe self-care measures that may prevent or decrease complications (Diabetes Survival Skills Education) will improve Outcome: Progressing Goal: Individualized Educational Video(s) Outcome: Progressing   Problem: Coping: Goal: Ability to adjust to condition or change in health will improve Outcome: Progressing   Problem: Fluid  Volume: Goal: Ability to maintain a balanced intake and output will improve Outcome: Progressing   Problem: Health Behavior/Discharge Planning: Goal: Ability to identify and utilize available resources and services will improve Outcome: Progressing Goal: Ability to manage health-related needs will improve Outcome: Progressing   Problem: Metabolic: Goal: Ability to maintain appropriate glucose levels will improve Outcome: Progressing   Problem: Nutritional: Goal: Maintenance of adequate nutrition will improve Outcome: Progressing Goal: Progress toward achieving an optimal weight will improve Outcome: Progressing   Problem: Skin Integrity: Goal: Risk for impaired skin integrity will decrease Outcome: Progressing   Problem: Tissue Perfusion: Goal: Adequacy of tissue perfusion will improve Outcome: Progressing   Problem: Education: Goal: Knowledge of discharge needs will improve Outcome: Progressing   Problem: Clinical Measurements: Goal: Postoperative complications will be avoided or minimized Outcome: Progressing   Problem: Respiratory: Goal: Will achieve and/or maintain a regular respiratory rate, without signs or symptoms of dyspnea Outcome: Progressing   Problem: Skin Integrity: Goal: Demonstration of wound healing without infection will improve Outcome: Progressing

## 2024-01-23 NOTE — Anesthesia Procedure Notes (Signed)
 Arterial Line Insertion Start/End11/18/2025 7:05 AM, 01/23/2024 7:15 AM Performed by: [name], MD, Atanacio Arland HERO, CRNA, CRNA  Patient location: Pre-op. Preanesthetic checklist: patient identified, IV checked, site marked, risks and benefits discussed, surgical consent, monitors and equipment checked, pre-op evaluation, timeout performed and anesthesia consent Lidocaine  1% used for infiltration Right, radial was placed Catheter size: 20 G Hand hygiene performed  and maximum sterile barriers used   Attempts: 1 Following insertion, dressing applied and Biopatch. Post procedure assessment: normal and unchanged  Patient tolerated the procedure well with no immediate complications.

## 2024-01-23 NOTE — Plan of Care (Signed)
  Problem: Education: Goal: Knowledge of General Education information will improve Description: Including pain rating scale, medication(s)/side effects and non-pharmacologic comfort measures Outcome: Progressing   Problem: Clinical Measurements: Goal: Ability to maintain clinical measurements within normal limits will improve Outcome: Progressing Goal: Will remain free from infection Outcome: Progressing Goal: Diagnostic test results will improve Outcome: Progressing Goal: Respiratory complications will improve Outcome: Progressing Goal: Cardiovascular complication will be avoided Outcome: Progressing   Problem: Activity: Goal: Risk for activity intolerance will decrease Outcome: Progressing   Problem: Nutrition: Goal: Adequate nutrition will be maintained Outcome: Progressing   Problem: Coping: Goal: Level of anxiety will decrease Outcome: Progressing   Problem: Elimination: Goal: Will not experience complications related to bowel motility Outcome: Progressing Goal: Will not experience complications related to urinary retention Outcome: Progressing   Problem: Pain Managment: Goal: General experience of comfort will improve and/or be controlled Outcome: Progressing   Problem: Safety: Goal: Ability to remain free from injury will improve Outcome: Progressing   Problem: Skin Integrity: Goal: Risk for impaired skin integrity will decrease Outcome: Progressing   Problem: Coping: Goal: Ability to adjust to condition or change in health will improve Outcome: Progressing   Problem: Metabolic: Goal: Ability to maintain appropriate glucose levels will improve Outcome: Progressing   Problem: Nutritional: Goal: Maintenance of adequate nutrition will improve Outcome: Progressing   Problem: Skin Integrity: Goal: Risk for impaired skin integrity will decrease Outcome: Progressing   Problem: Tissue Perfusion: Goal: Adequacy of tissue perfusion will improve Outcome:  Progressing   Problem: Clinical Measurements: Goal: Postoperative complications will be avoided or minimized Outcome: Progressing   Problem: Respiratory: Goal: Will achieve and/or maintain a regular respiratory rate, without signs or symptoms of dyspnea Outcome: Progressing   Problem: Skin Integrity: Goal: Demonstration of wound healing without infection will improve Outcome: Progressing

## 2024-01-23 NOTE — Progress Notes (Signed)
 Pharmacy note: postop antibiotics  63 yo female s/p procedure and ordered ancef for postop prophylaxis.   -She is currently on zosyn and vancomycin for cellulitis  Plan -Will discontinue ancef as current antibiotics will cover for post-op prophylaxis   Prentice Poisson, PharmD Clinical Pharmacist **Pharmacist phone directory can now be found on [email or web address] ([email or web address]).  Listed under [facility].

## 2024-01-23 NOTE — Anesthesia Postprocedure Evaluation (Signed)
 Anesthesia Post Note  Patient: Brandy Hall  Procedure(s) Performed: INSERTION OF BILATERAL ILIAC STENT, ARTERY (Bilateral: Abdomen) ULTRASOUND GUIDANCE, FOR VASCULAR ACCESS, BILATERAL FEMORAL ARTERIES (Bilateral: Groin)     Patient location during evaluation: PACU Anesthesia Type: General Level of consciousness: awake Pain management: pain level controlled Vital Signs Assessment: post-procedure vital signs reviewed and stable Respiratory status: spontaneous breathing Cardiovascular status: blood pressure returned to baseline Postop Assessment: no apparent nausea or vomiting Anesthetic complications: no   No notable events documented.                Lauraine KATHEE [place]

## 2024-01-23 NOTE — Op Note (Addendum)
 Patient name: Brandy Hall MRN: [id number] DOB: March 19, 1960 Sex: female  01/23/2024 Pre-operative Diagnosis: Atherosclerosis native arteries with right second toe gangrene, aortoiliac occlusive disease Post-operative diagnosis:  Same Surgeon:  Penne BROCKS. Sheree, MD Assistant: Lucie Apt, PA Procedure Performed:   1. Covered endovascular reconstruction of aortic bifurcation (CERAB) with aortic stent Cook Zenith alpha 24 x 105 extended distally with Gore conformable excluder cuff 26 x 4.5 cm.  Bilateral common iliac artery stenting with 8L x 59 mm VBX postdilated proximally with 14 mm balloons   2.  Percutaneous access and closure bilateral common femoral arteries with Pro-glide devices  Indications: 63 year old female with known aortoiliac occlusive disease and aortic ectasia with right second toe gangrene and right sided ABIs that are severely depressed with toe pressure that is 0.  We have reviewed her CT scan and discussed her options being aggressive options of stenting versus AUI with left to right femorofemoral bypass versus aortobifemoral bypass.  She has elected for stenting with bailout AUI and femorofemoral.  We have discussed the risk benefits and alternatives and she demonstrates good understanding and agrees to proceed.  An experienced assistant was necessary to facilitate percutaneous access to the bilateral common femoral arteries as well as passage of catheters, wires and sheaths.  Findings: The right common iliac artery was occluded for approximately 3 cm.  We were unable to cross the common iliac artery on the right from the right side but were ultimately able to come from the left side and ballooned the common iliac artery open and then were able to pass a wire centrally.  We elected for stenting of the aorta with  [facility] alpha 24 x 105.  This did initially deploy shattering both renal arteries we were able to pull this down and at completion both renal arteries did  fill briskly.  We did have to extend to the bifurcation of the aorta with Gore cuff which was 26 x 4.5 cm.  The common iliac arteries were stented with 8L x 59 mm VBX postdilated proximally 14 mm balloons.  At completion there was brisk flow into the bilateral common femoral arteries and these were percutaneous closed.  The left dorsalis pedis signal was the strongest and in the PACU was palpable and the right posterior tibial signal was strongest and ultimately was palpable.   Procedure:  The patient was identified in the holding area and taken to the operating room where she was placed upon operative table and general anesthesia was induced.  She was sterilely prepped and draped in the bilateral groins and abdomen in the usual fashion, antibiotics were administered and a timeout was called.  We began using ultrasound to identify the left common femoral artery which was somewhat disease posteriorly but this was cannulated with a micropuncture needle followed by wire and sheath under fluoroscopic guidance.  We placed a Bentson wire which did traverse into the aorta through the common iliac artery diseased area and we then placed a 5 French sheath.  Attention was then turned to the right common femoral artery which also was disease.  Ultrasound was used to cannulate this with a micropuncture needle followed by wire and sheath.  I initially placed a Bentson wire but an 8 French sheath would not track I then placed the micropuncture sheath and placed an Amplatz wire and placed an 8 French sheath into the right groin.  5000 units of heparin  was administered.  Using combination of Kumpe catheter and Glidewire advantage and  Bentson wire I attempted to traverse the occluded common iliac artery on the right.  We performed retrograde angiogram which demonstrated occlusion for approximately 3 cm.  I then placed the Amplatz and placed a longer 8 x 23 cm sheath.  Again I could not cross the occluded common iliac artery.  From  the left side I then used a crossover catheter and was able to get a Glidewire to traverse into the right external iliac artery and placed the crossover catheter into the right external iliac artery and confirmed intraluminal access.  I then placed the Amplatz wire up and over the bifurcation and then primarily ballooned to the left common iliac artery from the right sided sheath with a 4 mm balloon.  From the right side I was then able to get a Glidewire to traverse into the aorta and confirmed intraluminal access and then placed a Glidewire advantage and advanced the long 8 French sheath into the aorta.  On the left side we then placed a Bentson wire into the aorta and then dilated the wire tract with a Bentson wire and then deployed Pro-glide devices at 10:00 and 2:00.  We replaced the 8 French sheath and then placed the Amplatz wire into the aorta using a Kumpe catheter.  I elected to first balloon the left common iliac artery with a 6 mm balloon.  The [facility] alpha was then prepared and the sheath was removed from the left groin and the graft was placed into the aorta under direct fluoroscopic visualization.  An additional 5000 units of heparin  was administered.  From the right side we then placed an Omni catheter and performed aortogram.  The graft was then deployed just below the renal arteries.  Completion angiography demonstrated patency of the renal arteries although it did appear to somewhat impinge on the renal arteries but distally we were short of the aortic bifurcation.  We then elected to cover distally with a Gore cuff.  We first exchanged for a 16 French Gore dry seal sheath on the left over the Amplatz wire.  We then deployed a cuff to the aortic bifurcation with a 26 x 4.5 Gore conformable excluder cuff.  From the right side we then selected into the cuff with a Glidewire advantage and placed an Omni catheter and this was able to spin was noted to The Wire We Performed Angiography Which  Demonstrated That the Catheter Was within the Graft.  With an LAO Projection We Performed Retrograde Angiography and Then Elected for Deployment of an 8L x 50 9 Mm VBX from the Right Side Which Landed Just above the Hypogastric Artery.  On the Left Side We Then Placed the Omni Catheter and Performed Retrograde Angiography and Also Deployed an 8L x 50 9 Mm VBX.  These Were Then Both Postdilated with 14 Mm Balloons Simultaneously within the Aorta.  We Then Performed Completion Angiography and Unfortunately the Graft Appeared to Still Impinge on the Bilateral Renal Arteries More on the Left Than the Right.  We Used a [employer] and Inflated This with Attempts to Pull the Graft down.  Ultimately We Would Likely Pulled It down Approximately 1 Mm with Both Renal Arteries Briskly Filling I Elected for No Stenting of the Renal Arteries.  Completion Angiography Demonstrated Patency of the Bilateral Common Iliac Artery Stents.  We Then Performed Retrograde Angiography on the Left Which Demonstrated Patency of the Hypogastric Artery and the Left External Iliac Artery Appeared Healthy.  We Then Exchanged for  a Bentson Wire on the Left and Deployed Both Pro-Glide Devices and Hemostasis Was Obtained.  On the Right Side We Placed a Bentson Wire and Retracted the 8 French Sheath and Perform Retrograde Angiography and the Hypogastric Was Patent the Left External Iliac Artery Appeared to Be Satisfactory with Brisk Flow without Dissection.  We Then Placed the Pro-Glide Device on the Right and Deployed This.  Prior to Fully Cinching the Suture We Placed a Micropuncture Sheath and Perform Retrograde Angiography Which Demonstrated Patency.  Pro-Glide Device Was Then Fully Tightened.  There Was Strong Signals at the Left Dorsalis Pedis and Right Posterior Tibial Which Were the Strongest.  I Elected Not to heparinization.  Prior to transfer to the recovery area she had a palpable right posterior tibial pulse and left dorsalis pedis.   She was awakened from anesthesia having tolerated the procedure without any complication.  All counts were correct at completion.  Contrast: 136cc  EBL: 250cc   Ardra Kuznicki C. Sheree, MD [facility] [place] Office: [phone] Pager: [phone]

## 2024-01-23 NOTE — Anesthesia Preprocedure Evaluation (Signed)
 Anesthesia Evaluation  Patient identified by MRN, date of birth, ID band Patient awake    Reviewed: Allergy & Precautions, NPO status , Patient's Chart, lab work & pertinent test results, reviewed documented beta blocker date and time   History of Anesthesia Complications Negative for: history of anesthetic complications  Airway Mallampati: II  TM Distance: >3 FB Neck ROM: Full    Dental  (+) Teeth Intact, Dental Advisory Given   Pulmonary sleep apnea , COPD, Current Smoker   breath sounds clear to auscultation       Cardiovascular hypertension (Baseline SBP 150s), Pt. on medications and Pt. on home beta blockers + CAD, + Past MI and + Peripheral Vascular Disease (on Plavix (last dose 01/22/24))   Rhythm:Regular Rate:Normal  EKG (01/19/24): NSR with prolonged QTC.  TTE (10/2022): Normal biventricular function. Mild concentric LVH. No significant valvular abnormalities.   Neuro/Psych    GI/Hepatic ,GERD  Medicated and Controlled,,Hx of Esophageal Dilation   Endo/Other  diabetes (Last A1c of 8.7), Poorly Controlled, Type 2, Insulin  Dependent    Renal/GU      Musculoskeletal Cellulitis of R toe c/b gangrene    Abdominal   Peds  Hematology Hgb 11.7, Plts 326K (01/23/24)   Anesthesia Other Findings   Reproductive/Obstetrics                              Anesthesia Physical Anesthesia Plan  ASA: 3  Anesthesia Plan: General   Post-op Pain Management:    Induction: Intravenous  PONV Risk Score and Plan: 2 and Ondansetron , Dexamethasone and Treatment may vary due to age or medical condition  Airway Management Planned: Oral ETT  Additional Equipment: Arterial line  Intra-op Plan:   Post-operative Plan: Extubation in OR  Informed Consent:      Dental advisory given  Plan Discussed with: CRNA  Anesthesia Plan Comments:         Anesthesia Quick Evaluation

## 2024-01-23 NOTE — Progress Notes (Signed)
  Progress Note  01/23/2024 7:20 AM   Subjective:  no overnight issues  Vitals:   01/23/24 0610 01/23/24 0647  BP: (!) 155/77 (!) 168/89  Pulse: 70 83  Resp:  18  Temp:  98.1 F (36.7 C)  SpO2:  96%    Physical Exam: Aaox3 Stable right 2nd toe ulcer Palpable left dp  CBC    Component Value Date/Time   WBC 7.4 01/23/2024 0431   RBC 3.40 (L) 01/23/2024 0431   HGB 11.7 (L) 01/23/2024 0431   HGB 14.0 12/20/2023 1104   HCT 35.8 (L) 01/23/2024 0431   HCT 42.0 12/20/2023 1104   PLT 326 01/23/2024 0431   PLT 213 12/20/2023 1104   MCV 105.3 (H) 01/23/2024 0431   MCV 106 (H) 12/20/2023 1104   MCH 34.4 (H) 01/23/2024 0431   MCHC 32.7 01/23/2024 0431   RDW 12.6 01/23/2024 0431   RDW 13.2 12/20/2023 1104   LYMPHSABS 1.0 12/20/2023 1104   MONOABS 0.7 08/16/2023 0952   EOSABS 0.0 12/20/2023 1104   BASOSABS 0.0 12/20/2023 1104    BMET    Component Value Date/Time   NA 135 01/23/2024 0431   NA 140 12/20/2023 1104   K 4.0 01/23/2024 0431   CL 99 01/23/2024 0431   CO2 26 01/23/2024 0431   GLUCOSE 270 (H) 01/23/2024 0431   BUN 6 (L) 01/23/2024 0431   BUN 7 (L) 12/20/2023 1104   CREATININE 0.72 01/23/2024 0431   CALCIUM 8.8 (L) 01/23/2024 0431   GFRNONAA >60 01/23/2024 0431    INR No results found for: INR   Intake/Output Summary (Last 24 hours) at 01/23/2024 0720 Last data filed at 01/22/2024 0856 Gross per 24 hour  Intake 240 ml  Output --  Net 240 ml     Assessment:  63 y.o. female is here with R 2nd toe ulceration  Plan: OR today for aortic stenting with B CIA stents, possible AUI fem-fem. Again discussed risks, benefits and alternatives.    Marico Buckle C. Sheree, MD [facility] [place] Office: [phone] Pager: [phone]  01/23/2024 7:20 AM

## 2024-01-23 NOTE — Plan of Care (Signed)
°  Problem: Clinical Measurements: Goal: Will remain free from infection Outcome: Progressing   Problem: Activity: Goal: Risk for activity intolerance will decrease Outcome: Progressing   Problem: Nutrition: Goal: Adequate nutrition will be maintained Outcome: Progressing

## 2024-01-23 NOTE — Progress Notes (Addendum)
 PROGRESS NOTE  Brandy Hall FMW:[id number] DOB: 26-Oct-1960 DOA: 01/19/2024 PCP: Marelyn Quill, MD   LOS: 4 days   Brief narrative:  Brandy Hall is a 63 y.o. female with medical history significant of diabetes mellitus 2, CAD with prior MI, peripheral vascular disease, COPD, ongoing tobacco abuse, hypertension, and dyslipidemia who had been following with podiatrist Dr. Loel in [place] presented to hospital with  pain in the foot for 2 weeks, worsening over the last 5 to 7 days with blackish discoloration of fourth second toe with difficulty ambulating.  In the ED, patient was hypotensive with blood pressure of 137/1.  Initial labs were notable for blood glucose levels at 432, hemoglobin A1c of 8.7, magnesium 1.6.  Patient did not have leukocytosis.  Sodium was low at 130 with potassium of 3.4.  Patient was then admitted to the hospital for further evaluation and treatment.   Assessment/Plan: Principal Problem:   Cellulitis of right toe Active Problems:   Gangrene of toe of right foot (HCC)  Cellulitis right toe/foot in patient with diabetes mellitus 2 Severe peripheral vascular disease with critical limb ischemia of the right second toe with necrosis Podiatry and vascular surgery on board. Previous aortogram revealed 80% stenosis of the common iliac arteries bilaterally with associated small vessel disease below the knee.  Lower extremity arterial duplex with ABI showed severe right lower extremity arterial disease and mild left lower extremity arterial disease. Vascular surgery recommended  CT angiogram abdomen and pelvis with runoff to evaluate distal aorta and iliac arteries as well as repeat angiogram to evaluate for right lower extremity perfusion with findings of abdominal aortic aneurysm of 3.1 cm with occlusion of the right common Ilac artery with reconstitution fusion of distal right common femoral artery with focal stenosis in the right anterior tibial artery.  Patient  underwent angiogram on 01/23/2024 and had endovascular reconstruction of aortic bifurcation, bilateral common iliac artery stenting with Dr. Sheree.  Empirically patient was on antibiotics with vancomycin and Zosyn for cellulitis and will pain control.  Continue aspirin  Plavix, ezetimibe .  Podiatry has seen the patient and plan for follow-up 01/24/2024 to decide on further plans.  Mild hypokalemia.  Improved after replacement.  Latest potassium of 4.0.  Hypomagnesemia.  Improved.  Latest magnesium of 1.9  Neuropathic pain, continue Lyrica   Diabetes mellitus 2 Continue glargine and sliding scale insulin .  Hemoglobin A1c of 8.7.  Diabetic diet.  Latest POC glucose of 103  CAD/history of previous MI Continue Imdur  Ranexa aspirin  Plavix and Zetia .  No acute issues.   Hypertension Continue lisinopril , hydralazine  and Norvasc .    Continue with holding parameters.   COPD/tobacco use Continue DuoNeb and home inhalers.  Currently stable.Patient states that she is supposed to be on Chantix as outpatient..  Continue nicotine patch and gums while in the hospital.  Encouraged going back on Chantix at home.  DVT prophylaxis: heparin  injection 5,000 Units Start: 01/24/24 0600 SCD's Start: 01/23/24 1210   Disposition: Home likely in 2 to 3 days  Status is: Inpatient  Remains inpatient appropriate because: status post vascular surgery intervention, pending clinical improvement, possible need for surgical intervention    Code Status:     Code Status: Full Code  Family Communication: None at bedside  Consultants: Vascular surgery Podiatry  Procedures: ABI angiogram on 01/23/2024 and had endovascular reconstruction of aortic bifurcation, bilateral common iliac artery stenting with Dr. Sheree.  Anti-infectives:  Vancomycin and Zosyn IV  Anti-infectives (From admission, onward)    Start  Dose/Rate Route Frequency Ordered Stop   01/23/24 1300  ceFAZolin (ANCEF) IVPB 2g/100 mL premix   Status:  Discontinued        2 g 200 mL/hr over 30 Minutes Intravenous Every 8 hours 01/23/24 1209 01/23/24 1214   01/20/24 0400  vancomycin (VANCOREADY) IVPB 750 mg/150 mL        750 mg 150 mL/hr over 60 Minutes Intravenous Every 12 hours 02-03-2024 1034     02/03/2024 1130  piperacillin-tazobactam (ZOSYN) IVPB 3.375 g        3.375 g 12.5 mL/hr over 240 Minutes Intravenous Every 8 hours 2024/02/03 1019     Feb 03, 2024 1130  vancomycin (VANCOREADY) IVPB 1500 mg/300 mL        1,500 mg 150 mL/hr over 120 Minutes Intravenous  Once 02-03-2024 1019 02/03/2024 1717        Subjective: Today, patient was seen and examined at bedside.  Patient denies any nausea vomiting fever or chills seen after surgical intervention and complains of pain and had received pain medication.   Objective: Vitals:   01/23/24 1145 01/23/24 1200  BP: 133/68   Pulse: 83   Resp: 14 15  Temp: 97.6 F (36.4 C)   SpO2: 92%     Intake/Output Summary (Last 24 hours) at 01/23/2024 1311 Last data filed at 01/23/2024 1233 Gross per 24 hour  Intake 1590 ml  Output 1900 ml  Net -310 ml   Filed Weights   2024-02-03 0329 01/23/24 0647  Weight: 80.2 kg 80.2 kg   Body mass index is 26.88 kg/m.   Physical Exam:  Body mass index is 26.88 kg/m.  General:  Average built, not in obvious distress HENT:   No scleral pallor or icterus noted. Oral mucosa is moist.  Chest: Coarse breath sounds noted. No crackles or wheezes.  CVS: S1 &S2 heard. No murmur.  Regular rate and rhythm. Abdomen: Soft, nontender, nondistended.  Bowel sounds are heard.   Extremities: Right second toe with gangrenous changes.  Erythema noted over the right foot. Psych: Alert, awake and oriented, normal mood CNS:  No cranial nerve deficits.  Power equal in all extremities.   Skin: Warm and dry.  Right foot with erythema, gangrene second toe.   Data Review: I have personally reviewed the following laboratory data and studies,  CBC: Recent Labs  Lab  Feb 03, 2024 0648 01/20/24 0416 01/21/24 0712 01/22/24 0440 01/23/24 0431  WBC 7.6 15.1* 8.6 8.0 7.4  HGB 13.4 12.2 11.6* 11.6* 11.7*  HCT 39.2 36.0 34.8* 35.4* 35.8*  MCV 100.8* 102.3* 103.3* 104.7* 105.3*  PLT 266 294 288 302 326   Basic Metabolic Panel: Recent Labs  Lab Feb 03, 2024 0648 01/20/24 0416 01/21/24 0712 01/22/24 0440 01/23/24 0431  NA 130* 135 136 135 135  K 3.4* 3.3* 4.2 4.2 4.0  CL 94* 98 102 100 99  CO2 24 26 23 26 26   GLUCOSE 432* 219* 183* 203* 270*  BUN 8 11 8 8  6*  CREATININE 0.82 0.74 0.73 0.81 0.72  CALCIUM 8.6* 9.1 8.5* 8.8* 8.8*  MG 1.6*  --  2.0  --  1.9  PHOS  --   --  2.8  --  2.6   Liver Function Tests: Recent Labs  Lab 02-03-2024 0648 01/20/24 0416  AST 13* 11*  ALT 8 5  ALKPHOS 78 62  BILITOT 0.6 0.4  PROT 6.8 6.1*  ALBUMIN 2.6* 2.3*   No results for input(s): LIPASE, AMYLASE in the last 168 hours. No results for input(s):  AMMONIA in the last 168 hours. Cardiac Enzymes: No results for input(s): CKTOTAL, CKMB, CKMBINDEX, TROPONINI in the last 168 hours. BNP (last 3 results) No results for input(s): BNP in the last 8760 hours.  ProBNP (last 3 results) No results for input(s): PROBNP in the last 8760 hours.  CBG: Recent Labs  Lab 01/22/24 1955 01/23/24 0612 01/23/24 0823 01/23/24 1020 01/23/24 1107  GLUCAP 137* 259* 141* 115* 103*   Recent Results (from the past 240 hours)  Surgical pcr screen     Status: None   Collection Time: 01/23/24  5:05 AM   Specimen: Nasal Mucosa; Nasal Swab  Result Value Ref Range Status   MRSA, PCR NEGATIVE NEGATIVE Final   Staphylococcus aureus NEGATIVE NEGATIVE Final    Comment: (NOTE) The Xpert SA Assay (FDA approved for NASAL specimens in patients 73 years of age and older), is one component of a comprehensive surveillance program. It is not intended to diagnose infection nor to guide or monitor treatment. Performed at [facility], [address], [place],  [place] [address]      Studies: HYBRID OR IMAGING ([facility] ONLY) Result Date: 01/23/2024 There is no interpretation for this exam.  This order is for images obtained during a surgical procedure.  Please See Surgeries Tab for more information regarding the procedure.       Adilenne Ashworth, MD  [facility] 01/23/2024  If 7PM-7AM, please contact night-coverage

## 2024-01-23 NOTE — Anesthesia Procedure Notes (Signed)
 Procedure Name: Intubation Date/Time: 01/23/2024 7:54 AM  Performed by: Atanacio Arland HERO, CRNAPre-anesthesia Checklist: Patient identified, Emergency Drugs available, Suction available and Patient being monitored Patient Re-evaluated:Patient Re-evaluated prior to induction Oxygen Delivery Method: Circle System Utilized Preoxygenation: Pre-oxygenation with 100% oxygen Induction Type: IV induction Ventilation: Mask ventilation without difficulty Laryngoscope Size: Mac and 4 Grade View: Grade II Tube type: Oral Tube size: 7.0 mm Number of attempts: 1 Airway Equipment and Method: Stylet Placement Confirmation: ETT inserted through vocal cords under direct vision, positive ETCO2 and breath sounds checked- equal and bilateral Secured at: 22 cm Tube secured with: Tape Dental Injury: Teeth and Oropharynx as per pre-operative assessment

## 2024-01-23 NOTE — Transfer of Care (Signed)
 Immediate Anesthesia Transfer of Care Note  Patient: Brandy Hall  Procedure(s) Performed: INSERTION OF BILATERAL ILIAC STENT, ARTERY (Bilateral: Abdomen) ULTRASOUND GUIDANCE, FOR VASCULAR ACCESS, BILATERAL FEMORAL ARTERIES (Bilateral: Groin)  Patient Location: PACU  Anesthesia Type:General  Level of Consciousness: drowsy  Airway & Oxygen Therapy: Patient Spontanous Breathing and Patient connected to face mask oxygen  Post-op Assessment: Report given to RN and Post -op Vital signs reviewed and stable  Post vital signs: Reviewed and stable  Last Vitals:  Vitals Value Taken Time  BP 126/82 01/23/24 11:01  Temp 97.6   Pulse 84 01/23/24 11:04  Resp 16 01/23/24 11:04  SpO2 94% 01/23/24 11:04  Vitals shown include unfiled device data.  Last Pain:  Vitals:   01/23/24 0647  TempSrc: Oral  PainSc:       Patients Stated Pain Goal: 5 (01/20/24 2020)  Complications: No notable events documented.

## 2024-01-24 ENCOUNTER — Inpatient Hospital Stay (HOSPITAL_COMMUNITY)

## 2024-01-24 DIAGNOSIS — Z9889 Other specified postprocedural states: Secondary | ICD-10-CM

## 2024-01-24 DIAGNOSIS — I70202 Unspecified atherosclerosis of native arteries of extremities, left leg: Secondary | ICD-10-CM

## 2024-01-24 DIAGNOSIS — L03031 Cellulitis of right toe: Secondary | ICD-10-CM | POA: Diagnosis not present

## 2024-01-24 DIAGNOSIS — I70261 Atherosclerosis of native arteries of extremities with gangrene, right leg: Secondary | ICD-10-CM | POA: Diagnosis not present

## 2024-01-24 DIAGNOSIS — I700 Atherosclerosis of aorta: Secondary | ICD-10-CM | POA: Diagnosis not present

## 2024-01-24 DIAGNOSIS — Z95828 Presence of other vascular implants and grafts: Secondary | ICD-10-CM

## 2024-01-24 LAB — GLUCOSE, CAPILLARY
Glucose-Capillary: 113 mg/dL — ABNORMAL HIGH (ref 70–99)
Glucose-Capillary: 172 mg/dL — ABNORMAL HIGH (ref 70–99)

## 2024-01-24 LAB — CBC
HCT: 34.6 % — ABNORMAL LOW (ref 36.0–46.0)
Hemoglobin: 11 g/dL — ABNORMAL LOW (ref 12.0–15.0)
MCH: 33.6 pg (ref 26.0–34.0)
MCHC: 31.8 g/dL (ref 30.0–36.0)
MCV: 105.8 fL — ABNORMAL HIGH (ref 80.0–100.0)
Platelets: 304 K/uL (ref 150–400)
RBC: 3.27 MIL/uL — ABNORMAL LOW (ref 3.87–5.11)
RDW: 12.8 % (ref 11.5–15.5)
WBC: 8.9 K/uL (ref 4.0–10.5)
nRBC: 0 % (ref 0.0–0.2)

## 2024-01-24 LAB — BASIC METABOLIC PANEL WITH GFR
Anion gap: 11 (ref 5–15)
BUN: 5 mg/dL — ABNORMAL LOW (ref 8–23)
CO2: 23 mmol/L (ref 22–32)
Calcium: 8.3 mg/dL — ABNORMAL LOW (ref 8.9–10.3)
Chloride: 103 mmol/L (ref 98–111)
Creatinine, Ser: 0.59 mg/dL (ref 0.44–1.00)
GFR, Estimated: >60 mL/min (ref >60)
Glucose, Bld: 153 mg/dL — ABNORMAL HIGH (ref 70–99)
Potassium: 4.2 mmol/L (ref 3.5–5.1)
Sodium: 137 mmol/L (ref 135–145)

## 2024-01-24 MED ORDER — TRAMADOL HCL 50 MG PO TABS: 50.0000 mg | ORAL_TABLET | Freq: Four times a day (QID) | ORAL | 0 refills | Status: DC | PRN

## 2024-01-24 MED ORDER — NICOTINE POLACRILEX 2 MG MT GUM: 2.0000 mg | CHEWING_GUM | OROMUCOSAL | 0 refills | Status: AC | PRN

## 2024-01-24 MED ORDER — AMLODIPINE BESYLATE 5 MG PO TABS: 5.0000 mg | ORAL_TABLET | Freq: Every day | ORAL | 0 refills | Status: DC

## 2024-01-24 MED ORDER — DOCUSATE SODIUM 100 MG PO CAPS: 100.0000 mg | ORAL_CAPSULE | Freq: Every day | ORAL | 0 refills | Status: AC

## 2024-01-24 MED ORDER — PREGABALIN 50 MG PO CAPS: 50.0000 mg | ORAL_CAPSULE | Freq: Three times a day (TID) | ORAL | 0 refills | Status: DC

## 2024-01-24 MED ORDER — NICOTINE 21 MG/24HR TD PT24: 21.0000 mg | MEDICATED_PATCH | Freq: Every day | TRANSDERMAL | 0 refills | Status: AC

## 2024-01-24 MED ORDER — AMOXICILLIN-POT CLAVULANATE 875-125 MG PO TABS: 1.0000 | ORAL_TABLET | Freq: Two times a day (BID) | ORAL | 0 refills | Status: DC

## 2024-01-24 NOTE — Progress Notes (Signed)
 Orthopedic Tech Progress Note Patient Details:  Brandy Hall 06-Jul-1960 [id number]  Ortho Devices Type of Ortho Device: Postop shoe/boot Ortho Device/Splint Location: RLE Ortho Device/Splint Interventions: Ordered  Left at Bedside patient was getting a bath. Post Interventions Patient Tolerated: Brandy Hall Brandy Hall 01/24/2024, 10:39 AM

## 2024-01-24 NOTE — Discharge Summary (Signed)
 Physician Discharge Summary  Brandy Hall FMW:[id number] DOB: 10/21/1960 DOA: 01/19/2024  PCP: Marelyn Quill, MD  Admit date: 01/19/2024 Discharge date: 01/24/2024  Admitted From: Home  Discharge disposition: Home   Recommendations for Outpatient Follow-Up:   Follow up with your primary care provider in one week.  Check CBC, BMP, magnesium in the next visit Follow-up with Dr. Joaquim podiatry as scheduled by the clinic in 1 week or so Patient should be encouraged to quit smoking completely  Discharge Diagnosis:   Principal Problem:   Cellulitis of right toe Active Problems:   Gangrene of toe of right foot (HCC)   Discharge Condition: Improved.  Diet recommendation: Low sodium, heart healthy.  Carbohydrate-modified.    Wound care: None.  Code status: Full.   History of Present Illness:   Brandy Hall is a 63 y.o. female with medical history significant of diabetes mellitus 2, CAD with prior MI, peripheral vascular disease, COPD, ongoing tobacco abuse, hypertension, and dyslipidemia who had been following with podiatrist Dr. Loel in [place] presented to hospital with  pain in the foot for 2 weeks, worsening over the last 5 to 7 days with blackish discoloration of fourth second toe with difficulty ambulating.  In the ED, patient was hypotensive with blood pressure of 137/1.  Initial labs were notable for blood glucose levels at 432, hemoglobin A1c of 8.7, magnesium 1.6.  Patient did not have leukocytosis.  Sodium was low at 130 with potassium of 3.4.  Patient was then admitted to the hospital for further evaluation and treatment.   Hospital Course:   Following conditions were addressed during hospitalization as listed below,  Cellulitis right toe/foot in patient with diabetes mellitus 2 Severe peripheral vascular disease with critical limb ischemia of the right second toe with necrosis Podiatry and vascular surgery followed the patient during  hospitalization.  Previous aortogram revealed 80% stenosis of the common iliac arteries bilaterally with associated small vessel disease below the knee.  Lower extremity arterial duplex with ABI showed severe right lower extremity arterial disease and mild left lower extremity arterial disease. Vascular surgery recommended  CT angiogram abdomen and pelvis with runoff to evaluate distal aorta and iliac arteries as well as repeat angiogram to evaluate for right lower extremity perfusion with findings of abdominal aortic aneurysm of 3.1 cm with occlusion of the right common Ilac artery with reconstitution fusion of distal right common femoral artery with focal stenosis in the right anterior tibial artery.  Patient underwent angiogram on 01/23/2024 and had endovascular reconstruction of aortic bifurcation, bilateral common iliac artery stenting with Dr. Sheree.  Empirically patient was on antibiotics with vancomycin and Zosyn for cellulitis and this will be changed to Augmentin for next 5 days on discharge to complete the course.  Continue aspirin  Plavix, ezetimibe .  Podiatry has seen the patient today and plan for outpatient follow-up with Dr. Joaquim, podiatry as outpatient to decide further treatment plan and to allow toes to demarcate better.   Mild hypokalemia.  Improved after replacement.  Latest potassium of 4.2   Hypomagnesemia.  Improved.  Latest magnesium of 1.9   Neuropathic pain, continue Lyrica will prescribe on discharge.   Diabetes mellitus 2 Continue glargine and sliding scale insulin .  Hemoglobin A1c of 8.7.  Diabetic diet.  Latest POC glucose of 113   CAD/history of previous MI Continue Imdur  Ranexa, aspirin  Plavix and Zetia .  No acute issues.   Hypertension Continue lisinopril , hydralazine  and Norvasc .    Continue with holding parameters.  COPD/tobacco use Continue DuoNeb and home inhalers.  Currently stable.Patient states that she is supposed to be on Chantix as outpatient..  Continue  nicotine patch and gums while in the hospital.  Prescribed patch and gums on discharge.  Encouraged going back on Chantix at home.   Disposition.  At this time, patient is stable for disposition home with outpatient PCP and podiatry follow-up.  Medical Consultants:   Vascular surgery Podiatry  Procedures:    ABI angiogram on 01/23/2024 and had endovascular reconstruction of aortic bifurcation, bilateral common iliac artery stenting with Dr. Sheree.  Subjective:   Today, patient was seen and examined.  Complains of mild foot pain.  Overall otherwise feels okay.  Denies any shortness of breath dyspnea fever or chills.  Discharge Exam:   Vitals:   01/24/24 0600 01/24/24 0830  BP:  130/76  Pulse: 78 82  Resp:  15  Temp:  98 F (36.7 C)  SpO2: 99% 96%   Vitals:   01/23/24 2303 01/24/24 0257 01/24/24 0600 01/24/24 0830  BP: (!) 151/84 112/74  130/76  Pulse: 85 79 78 82  Resp: 16 16  15   Temp: 97.7 F (36.5 C) 98 F (36.7 C)  98 F (36.7 C)  TempSrc: Oral Oral  Oral  SpO2: 100% 100% 99% 96%  Weight:      Height:       Body mass index is 26.88 kg/m.   General: Alert awake, not in obvious distress HENT: pupils equally reacting to light,  No scleral pallor or icterus noted. Oral mucosa is moist.  Chest:  Clear breath sounds.  No crackles or wheezes.  CVS: S1 &S2 heard. No murmur.  Regular rate and rhythm. Abdomen: Soft, nontender, nondistended.  Bowel sounds are heard.   Extremities: No cyanosis, clubbing, right second toe with gangrenous tip, blackish discoloration of the 3rd and 4th toes as well. Psych: Alert, awake and oriented, normal mood CNS:  No cranial nerve deficits.  Power equal in all extremities.   Skin: Warm and dry.  Right foot ischemic toes.  The results of significant diagnostics from this hospitalization (including imaging, microbiology, ancillary and laboratory) are listed below for reference.     Diagnostic Studies:   CT ANGIO AO+BIFEM W & OR WO  CONTRAST Result Date: 01/20/2024 CLINICAL DATA:  Atherosclerosis with rest pain EXAM: CT ANGIOGRAPHY OF ABDOMINAL AORTA WITH ILIOFEMORAL RUNOFF TECHNIQUE: Multidetector CT imaging of the abdomen, pelvis and lower extremities was performed using the standard protocol during bolus administration of intravenous contrast. Multiplanar CT image reconstructions and MIPs were obtained to evaluate the vascular anatomy. RADIATION DOSE REDUCTION: This exam was performed according to the departmental dose-optimization program which includes automated exposure control, adjustment of the mA and/or kV according to patient size and/or use of iterative reconstruction technique. CONTRAST:  OMNIPAQUE IOHEXOL 350 MG/ML SOLN COMPARISON:  CTA of the chest abdomen and pelvis performed April 13, 2022 FINDINGS: VASCULAR Aorta: Fusiform aneurysm of the infrarenal segment measuring 3.1 x 3.0 cm with moderate wall adherent plaque/thrombus. Celiac: Patent. SMA: Patent. Renals: Patent. IMA: Patent. RIGHT Lower Extremity Inflow: The right common iliac artery is occluded in the proximal segment. The occluded segment is dilated measuring 1.9 cm in largest dimension. The length of the occlusion is approximately 4 cm. The distal right common iliac artery is reconstituted. A moderate stenosis is present in the proximal right internal iliac artery. The right external iliac artery is ectatic and demonstrates mild diffuse disease. Outflow: Mild atherosclerotic changes secondary to  fibrofatty and calcified plaque in the right common femoral artery. Multifocal mild disease is present within the right SFA. The right popliteal artery is patent. Runoff: Proximal origin of the right anterior tibial artery. There is a focal moderate stenosis estimated at 50% in the mid segment of the anterior tibial artery. The posterior tibial artery and peroneal arteries are patent. LEFT Lower Extremity Inflow: Focal mild-to-moderate stenosis is present the proximal  left common iliac artery which is estimated at 50%. There is mild dilation of the distal left common iliac artery. A moderate stenosis is present in the proximal left internal iliac artery. A mild stenosis is present in the proximal left external iliac artery. The left external iliac artery is patent. Outflow: Left common femoral artery is patent. Mild atherosclerotic changes are present in the left SFA. Left popliteal artery is patent. Runoff: Proximal origin of the left anterior tibial artery. A focal moderate stenosis is present in the distal posterior tibial artery. The peroneal artery is patent. Veins: No obvious venous abnormality within the limitations of this arterial phase study. Review of the MIP images confirms the above findings. NON-VASCULAR Lower chest: Nothing acute. Hepatobiliary: Postsurgical changes from cholecystectomy. Pancreas: Unremarkable. No pancreatic ductal dilatation or surrounding inflammatory changes. Spleen: Normal in size without focal abnormality. Adrenals/Urinary Tract: The adrenal glands are within normal limits. There is no hydronephrosis. Small cysts are present in both kidneys. Largest in the right kidney measures 2.0 cm in the largest in the left kidney measures 3.6 cm. These are low in attenuation in favored to be simple. The ureters are grossly unremarkable. The bladder is within normal limits. Stomach/Bowel: No dilated loops of bowel are seen. Colonic diverticulosis. Lymphatic: No significant lymphadenopathy. Reproductive: Status post hysterectomy. No adnexal masses. Other: Nothing significant. Musculoskeletal: Degenerative changes are present in the lumbar spine. Chronic L1 compression deformity. IMPRESSION: 1. Abdominal aortic aneurysm measuring 3.1 cm. 2. Right lower extremity: Occlusion of the right common iliac artery measuring approximately 4 cm in length. Reconstitution of the distal right common iliac artery. Mild outflow disease. Focal moderate stenosis in the right  anterior tibial artery. The peroneal and posterior tibial arteries are patent. 3. Left lower extremity: Mild to moderate disease is present in the proximal left common iliac artery. The left external iliac artery is patent. Patent outflow with mild disease. Focal moderate stenosis in the distal left posterior tibial artery. The anterior tibial and peroneal arteries are patent. Electronically Signed   By: Maude Naegeli M.D.   On: 01/20/2024 08:07   VAS US  ABI WITH/WO TBI Result Date: 01/19/2024  LOWER EXTREMITY DOPPLER STUDY Patient Name:  Brandy Hall  Date of Exam:   01/19/2024 Medical Rec #: [id number]         Accession #:    [id number] Date of Birth: 1960-10-29         Patient Gender: F Patient Age:   69 years Exam Location:  [facility] Procedure:      VAS US  ABI WITH/WO TBI Referring Phys: TERETHA DAMME --------------------------------------------------------------------------------  Indications: Peripheral artery disease. Present with worsening erythema and pai              in her fight foot and worsening necrotic changes in the 2nd right              toe. High Risk Factors: Hypertension, hyperlipidemia, Diabetes, past history of                    smoking, coronary  artery disease. Other Factors: History of bilateral common iliac arteries 80% stenosis on CTA on                09/06/23. No intervention performed.  Performing Technologist: Ricka Sturdivant-Jones RDMS, RVT  Examination Guidelines: A complete evaluation includes at minimum, Doppler waveform signals and systolic blood pressure reading at the level of bilateral brachial, anterior tibial, and posterior tibial arteries, when vessel segments are accessible. Bilateral testing is considered an integral part of a complete examination. Photoelectric Plethysmograph (PPG) waveforms and toe systolic pressure readings are included as required and additional duplex testing as needed. Limited examinations for reoccurring indications may be performed  as noted.  ABI Findings: +---------+------------------+-----+----------+--------+ Right    Rt Pressure (mmHg)IndexWaveform  Comment  +---------+------------------+-----+----------+--------+ Brachial 133                    triphasic          +---------+------------------+-----+----------+--------+ PTA      58                0.42 monophasic         +---------+------------------+-----+----------+--------+ DP       55                0.40 monophasic         +---------+------------------+-----+----------+--------+ Great Toe                       Absent             +---------+------------------+-----+----------+--------+ +---------+------------------+-----+---------+-------+ Left     Lt Pressure (mmHg)IndexWaveform Comment +---------+------------------+-----+---------+-------+ Brachial 137                    triphasic        +---------+------------------+-----+---------+-------+ PTA      120               0.88 biphasic         +---------+------------------+-----+---------+-------+ DP       115               0.84 biphasic         +---------+------------------+-----+---------+-------+ Great Toe96                0.70 Normal           +---------+------------------+-----+---------+-------+ +-------+-----------+-----------+------------+------------+ ABI/TBIToday's ABIToday's TBIPrevious ABIPrevious TBI +-------+-----------+-----------+------------+------------+ Right  0.42       absent     0.69        0.72         +-------+-----------+-----------+------------+------------+ Left   0.84       0.70       0.99        0.85         +-------+-----------+-----------+------------+------------+ Bilateral ABIs and TBIs appear decreased compared to prior study on 06/26/23.  Summary: Right: Resting right ankle-brachial index indicates severe right lower extremity arterial disease.  Left: Resting left ankle-brachial index indicates mild left lower extremity arterial  disease. The left toe-brachial index is normal.  *See table(s) above for measurements and observations.  Electronically signed by Penne Colorado MD on 01/19/2024 at 6:03:38 PM.    Final    VAS US  LOWER EXTREMITY ARTERIAL DUPLEX Result Date: 01/19/2024 LOWER EXTREMITY ARTERIAL DUPLEX STUDY Patient Name:  Brandy Hall  Date of Exam:   01/19/2024 Medical Rec #: [id number]         Accession #:    [id number] Date of Birth: 28-Oct-1960  Patient Gender: F Patient Age:   61 years Exam Location:  [facility] Procedure:      VAS US  LOWER EXTREMITY ARTERIAL DUPLEX Referring Phys: TERETHA DAMME --------------------------------------------------------------------------------  Indications: Peripheral artery disease, and presenting with worsening erythema              and pain in her right foot and worsening necrotic changes in the              2nd toe. High Risk Factors: Hypertension, hyperlipidemia, Diabetes, past history of                    smoking, coronary artery disease. Other Factors: COPD, GERD                Bilateral CIA 80% stenosis on 09/06/23 arteriogram. No intervention                performed.  Current ABI: Right 0.42, Left 0.84 Performing Technologist: Ricka Sturdivant-Jones RDMS, RVT  Examination Guidelines: A complete evaluation includes B-mode imaging, spectral Doppler, color Doppler, and power Doppler as needed of all accessible portions of each vessel. Bilateral testing is considered an integral part of a complete examination. Limited examinations for reoccurring indications may be performed as noted.  +-----------+--------+-----+---------------+-------------------+--------+ RIGHT      PSV cm/sRatioStenosis       Waveform           Comments +-----------+--------+-----+---------------+-------------------+--------+ CIA Prox   360          75-99% stenosis                            +-----------+--------+-----+---------------+-------------------+--------+ EIA Mid    48                           monophasic                  +-----------+--------+-----+---------------+-------------------+--------+ CFA Mid    50                          monophasic                  +-----------+--------+-----+---------------+-------------------+--------+ DFA        25                          monophasic                  +-----------+--------+-----+---------------+-------------------+--------+ SFA Prox   40                          monophasic                  +-----------+--------+-----+---------------+-------------------+--------+ SFA Mid    48                          monophasic                  +-----------+--------+-----+---------------+-------------------+--------+ SFA Distal 31                          monophasic                  +-----------+--------+-----+---------------+-------------------+--------+ POP Prox   30  monophasic                  +-----------+--------+-----+---------------+-------------------+--------+ POP Distal 29                          monophasic                  +-----------+--------+-----+---------------+-------------------+--------+ ATA Prox   27                          monophasic                  +-----------+--------+-----+---------------+-------------------+--------+ ATA Mid    20                          monophasic                  +-----------+--------+-----+---------------+-------------------+--------+ ATA Distal 18                          monophasic                  +-----------+--------+-----+---------------+-------------------+--------+ PTA Prox   28                          monophasic                  +-----------+--------+-----+---------------+-------------------+--------+ PTA Mid    26                          monophasic                  +-----------+--------+-----+---------------+-------------------+--------+ PTA Distal 27                           monophasic                  +-----------+--------+-----+---------------+-------------------+--------+ PERO Mid   19                          monophasic                  +-----------+--------+-----+---------------+-------------------+--------+ PERO Distal19                          monophasic                  +-----------+--------+-----+---------------+-------------------+--------+ DP         14                          dampened monophasic         +-----------+--------+-----+---------------+-------------------+--------+  Summary: Right: 75-99% stenosis in the right common iliac artery. No significant stenosis noted distally.  See table(s) above for measurements and observations. Electronically signed by Penne Colorado MD on 01/19/2024 at 6:02:56 PM.    Final    DG Foot 2 Views Right Result Date: 01/19/2024 EXAM: 1 or 2 VIEW(S) XRAY OF THE RIGHT FOOT 01/19/2024 10:19:00 AM COMPARISON: Outside right foot radiograph 01/18/2024. CLINICAL HISTORY: Gangrene (HCC) 10012. FINDINGS: BONES AND JOINTS: No acute fracture. No focal osseous lesion. No joint dislocation. Mild degenerative changes of the 1st MTP (metatarsophalangeal) joint. SOFT TISSUES: Prominent  diffuse soft tissue swelling throughout the foot. IMPRESSION: 1. Prominent diffuse soft tissue swelling throughout the foot. No focal bony abnormality. Electronically signed by: Ryan Chess MD 01/19/2024 02:58 PM EST RP Workstation: [id number]     Labs:   Basic Metabolic Panel: Recent Labs  Lab 01/19/24 0648 01/20/24 0416 01/21/24 0712 01/22/24 0440 01/23/24 0431 01/23/24 0930 01/24/24 0345  NA 130* 135 136 135 135 142 137  K 3.4* 3.3* 4.2 4.2 4.0 3.5 4.2  CL 94* 98 102 100 99  --  103  CO2 24 26 23 26 26   --  23  GLUCOSE 432* 219* 183* 203* 270*  --  153*  BUN 8 11 8 8  6*  --  5*  CREATININE 0.82 0.74 0.73 0.81 0.72  --  0.59  CALCIUM 8.6* 9.1 8.5* 8.8* 8.8*  --  8.3*  MG 1.6*  --  2.0  --  1.9  --   --   PHOS  --   --   2.8  --  2.6  --   --    GFR Estimated Creatinine Clearance: 77.9 mL/min (by C-G formula based on SCr of 0.59 mg/dL). Liver Function Tests: Recent Labs  Lab 01/19/24 0648 01/20/24 0416  AST 13* 11*  ALT 8 5  ALKPHOS 78 62  BILITOT 0.6 0.4  PROT 6.8 6.1*  ALBUMIN 2.6* 2.3*   No results for input(s): LIPASE, AMYLASE in the last 168 hours. No results for input(s): AMMONIA in the last 168 hours. Coagulation profile No results for input(s): INR, PROTIME in the last 168 hours.  CBC: Recent Labs  Lab 01/20/24 0416 01/21/24 0712 01/22/24 0440 01/23/24 0431 01/23/24 0930 01/24/24 0345  WBC 15.1* 8.6 8.0 7.4  --  8.9  HGB 12.2 11.6* 11.6* 11.7* 11.6* 11.0*  HCT 36.0 34.8* 35.4* 35.8* 34.0* 34.6*  MCV 102.3* 103.3* 104.7* 105.3*  --  105.8*  PLT 294 288 302 326  --  304   Cardiac Enzymes: No results for input(s): CKTOTAL, CKMB, CKMBINDEX, TROPONINI in the last 168 hours. BNP: Invalid input(s): POCBNP CBG: Recent Labs  Lab 01/23/24 1107 01/23/24 1536 01/23/24 2126 01/24/24 0615 01/24/24 1130  GLUCAP 103* 80 189* 113* 172*   D-Dimer No results for input(s): DDIMER in the last 72 hours. Hgb A1c No results for input(s): HGBA1C in the last 72 hours. Lipid Profile No results for input(s): CHOL, HDL, LDLCALC, TRIG, CHOLHDL, LDLDIRECT in the last 72 hours. Thyroid function studies No results for input(s): TSH, T4TOTAL, T3FREE, THYROIDAB in the last 72 hours.  Invalid input(s): FREET3 Anemia work up No results for input(s): VITAMINB12, FOLATE, FERRITIN, TIBC, IRON, RETICCTPCT in the last 72 hours. Microbiology Recent Results (from the past 240 hours)  Surgical pcr screen     Status: None   Collection Time: 01/23/24  5:05 AM   Specimen: Nasal Mucosa; Nasal Swab  Result Value Ref Range Status   MRSA, PCR NEGATIVE NEGATIVE Final   Staphylococcus aureus NEGATIVE NEGATIVE Final    Comment: (NOTE) The Xpert SA  Assay (FDA approved for NASAL specimens in patients 46 years of age and older), is one component of a comprehensive surveillance program. It is not intended to diagnose infection nor to guide or monitor treatment. Performed at [facility], [address], [place], [place] [address]      Discharge Instructions:   Discharge Instructions     Call MD for:  persistant nausea and vomiting   Complete by: As directed  Call MD for:  severe uncontrolled pain   Complete by: As directed    Call MD for:  temperature >100.4   Complete by: As directed    Diet Carb Modified   Complete by: As directed    Discharge instructions   Complete by: As directed    Follow-up with your primary care provider in 1 week for routine blood work.  Follow-up with Dr. Joaquim podiatry as scheduled by the clinic to discuss about further treatment plan on the  toes.  Seek medical attention for systemic symptoms.   Increase activity slowly   Complete by: As directed    No wound care   Complete by: As directed       Allergies as of 01/24/2024       Reactions   Crestor [rosuvastatin] Other (See Comments)   Myalgias    Percocet [oxycodone-acetaminophen] Hives   Statins Other (See Comments)   Per patient shuts down organs   Trulicity [dulaglutide] Nausea And Vomiting, Nausea Only, Other (See Comments)   Pancreatitis         Medication List     TAKE these medications    albuterol  108 (90 Base) MCG/ACT inhaler Commonly known as: VENTOLIN  HFA Inhale 2 puffs into the lungs every 6 (six) hours as needed for wheezing or shortness of breath.   amLODipine  5 MG tablet Commonly known as: NORVASC  Take 1 tablet (5 mg total) by mouth daily. What changed: when to take this   amoxicillin-clavulanate 875-125 MG tablet Commonly known as: AUGMENTIN Take 1 tablet by mouth 2 (two) times daily for 5 days.   ASPIRIN  81 PO Take 81 mg by mouth daily.   clopidogrel 75 MG tablet Commonly known as:  PLAVIX Take 75 mg by mouth daily.   docusate sodium 100 MG capsule Commonly known as: COLACE Take 1 capsule (100 mg total) by mouth daily. Start taking on: January 25, 2024   ezetimibe  10 MG tablet Commonly known as: ZETIA  Take 10 mg by mouth daily.   Insulin  Aspart FlexPen 100 UNIT/ML Commonly known as: NOVOLOG  Inject 0-10 Units into the skin with breakfast, with lunch, and with evening meal. Sliding Scale   isosorbide  mononitrate 30 MG 24 hr tablet Commonly known as: IMDUR  Take 30 mg by mouth daily.   Lantus SoloStar 100 UNIT/ML Solostar Pen Generic drug: insulin  glargine Inject 24 Units into the skin daily.   lisinopril  5 MG tablet Commonly known as: ZESTRIL  Take 10 mg by mouth daily.   metoprolol  tartrate 25 MG tablet Commonly known as: LOPRESSOR  Take 25 mg by mouth 2 (two) times daily.   montelukast  10 MG tablet Commonly known as: SINGULAIR  Take 1 tablet by mouth at bedtime.   multivitamin capsule Take 1 capsule by mouth daily.   nicotine 21 mg/24hr patch Commonly known as: NICODERM CQ - dosed in mg/24 hours Place 1 patch (21 mg total) onto the skin daily. Start taking on: January 25, 2024   nicotine polacrilex 2 MG gum Commonly known as: NICORETTE Take 1 each (2 mg total) by mouth as needed for smoking cessation.   nitroGLYCERIN  0.4 MG SL tablet Commonly known as: NITROSTAT  Place 0.4 mg under the tongue every 5 (five) minutes as needed for chest pain.   pantoprazole  40 MG tablet Commonly known as: PROTONIX  Take 1 tablet (40 mg total) by mouth 2 (two) times daily before a meal.   potassium chloride  10 MEQ tablet Commonly known as: KLOR-CON  Take 10 mEq by mouth daily.   pregabalin  50 MG capsule Commonly known as: LYRICA Take 1 capsule (50 mg total) by mouth 3 (three) times daily.   Ranexa 500 MG 12 hr tablet Generic drug: ranolazine Take 500 mg by mouth 2 (two) times daily.   traMADol  50 MG tablet Commonly known as: ULTRAM  Take 1 tablet (50  mg total) by mouth every 6 (six) hours as needed for severe pain (pain score 7-10) or moderate pain (pain score 4-6). What changed: reasons to take this   Trelegy Ellipta 200-62.5-25 MCG/ACT Aepb Generic drug: Fluticasone-Umeclidin-Vilant Inhale 1 puff into the lungs daily in the afternoon.   VITAMIN D -3 PO Take 1 tablet by mouth daily.        Follow-up Information     [facility] Follow up in 1 month(s).   Specialty: Vascular Surgery Why: The office will call you with your appointment Contact information: [address]  [place]  [address] [phone]        Sistasis, Laurita, MD Follow up in 1 week(s).   Specialty: Family Medicine Contact information: [address] [place] [address] [phone]         Dial, Dekarlos M, DPM Follow up.   Specialty: Podiatry Why: as per Dr Joaquim office Contact information: [address] [place] [address] [phone]                  Time coordinating discharge: 39 minutes  Signed:  Naleigha Raimondi  [facility] 01/24/2024, 12:53 PM

## 2024-01-24 NOTE — Progress Notes (Signed)
   PODIATRY PROGRESS NOTE Patient Name: Brandy Hall  DOB 08-01-1960 DOA 01/19/2024  Hospital Day: 6  Assessment:  63 y.o. 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   63 y.o. female is s/p Aortogram, Arteriogram with CERAB with aortic stent extended distally with Gore excluder and bilateral CIA stenting and angioplasty 1 Day Post-Op   A1c 8.7 XR right foot 1. Prominent diffuse soft tissue swelling throughout the foot. No focal bony abnormality.     Plan:  - S/p revascularization with vascular surgery yesterday with good improvement  - Plan to allow demarcation of dry gangrenous changes to the toes to determine level of amputation on right foot - discussed she is facing toe amputation(s) vs trans met amputation.  - Recommend  5 days augmentin as outpatient  - Anticoagulation: Continue - Wound care: Betadine paint to the distal second toe as needed - no gauze, ok to wear sock - WB status: Weightbearing as tolerated right foot in post op shoe - Will arrange follow up with Dr. Awanda in [place] in 2 weeks, stable for dc from my perspective will sign off.         Brandy Hall, DPM [facility]    Subjective:  Discussed plans to allow demarcation as an outpatient she is in agreement   Objective:   Vitals:   01/24/24 0600 01/24/24 0830  BP:  130/76  Pulse: 78 82  Resp:  15  Temp:  98 F (36.7 C)  SpO2: 99% 96%       Latest Ref Rng & Units 01/24/2024    3:45 AM 01/23/2024    9:30 AM 01/23/2024    4:31 AM  CBC  WBC 4.0 - 10.5 K/uL 8.9   7.4   Hemoglobin 12.0 - 15.0 g/dL 88.9  88.3  88.2   Hematocrit 36.0 - 46.0 % 34.6  34.0  35.8   Platelets 150 - 400 K/uL 304   326        Latest Ref Rng & Units 01/24/2024    3:45 AM 01/23/2024    9:30 AM 01/23/2024    4:31 AM  BMP  Glucose 70 - 99 mg/dL 846   729   BUN 8 - 23  mg/dL 5   6   Creatinine 9.55 - 1.00 mg/dL 9.40   9.27   Sodium 864 - 145 mmol/L 137  142  135   Potassium 3.5 - 5.1 mmol/L 4.2  3.5  4.0   Chloride 98 - 111 mmol/L 103   99   CO2 22 - 32 mmol/L 23   26   Calcium 8.9 - 10.3 mg/dL 8.3   8.8     General: AAOx3, NAD  Lower Extremity Exam Right foot min palpable pt, dp Edema right foot   Erythema decreased from prior   Decreased tender to palpation to the right forefoot   Dry gangrenous changes to the distal aspect of the second toe on the right foot  Also with some worsening changes to the plantar hallux, 3rd and 4th toes as well       Radiology:  Results reviewed. See assessment for pertinent imaging results

## 2024-01-24 NOTE — Progress Notes (Addendum)
  Progress Note    01/24/2024 8:28 AM 1 Day Post-Op  Subjective:  overall doing well. Says she has some increased sensation in her right toes. Pain is better    Vitals:   01/24/24 0257 01/24/24 0600  BP: 112/74   Pulse: 79 78  Resp: 16   Temp: 98 F (36.7 C)   SpO2: 100% 99%   Physical Exam: Cardiac:  regular Lungs:  non labored Incisions:  B common femoral access sites without swelling or hematoma  Extremities:  2+ femoral pulses, right PT palpable left DP palpable. Feet warm and well perfused. Stable ischemic changes to the right toes Abdomen:  obese, soft Neurologic: alert and oriented   CBC    Component Value Date/Time   WBC 8.9 01/24/2024 0345   RBC 3.27 (L) 01/24/2024 0345   HGB 11.0 (L) 01/24/2024 0345   HGB 14.0 12/20/2023 1104   HCT 34.6 (L) 01/24/2024 0345   HCT 42.0 12/20/2023 1104   PLT 304 01/24/2024 0345   PLT 213 12/20/2023 1104   MCV 105.8 (H) 01/24/2024 0345   MCV 106 (H) 12/20/2023 1104   MCH 33.6 01/24/2024 0345   MCHC 31.8 01/24/2024 0345   RDW 12.8 01/24/2024 0345   RDW 13.2 12/20/2023 1104   LYMPHSABS 1.0 12/20/2023 1104   MONOABS 0.7 08/16/2023 0952   EOSABS 0.0 12/20/2023 1104   BASOSABS 0.0 12/20/2023 1104    BMET    Component Value Date/Time   NA 137 01/24/2024 0345   NA 140 12/20/2023 1104   K 4.2 01/24/2024 0345   CL 103 01/24/2024 0345   CO2 23 01/24/2024 0345   GLUCOSE 153 (H) 01/24/2024 0345   BUN 5 (L) 01/24/2024 0345   BUN 7 (L) 12/20/2023 1104   CREATININE 0.59 01/24/2024 0345   CALCIUM 8.3 (L) 01/24/2024 0345   GFRNONAA >60 01/24/2024 0345    INR No results found for: INR   Intake/Output Summary (Last 24 hours) at 01/24/2024 9171 Last data filed at 01/24/2024 0600 Gross per 24 hour  Intake 2767.18 ml  Output 3000 ml  Net -232.82 ml     Assessment/Plan:  63 y.o. female is s/p Aortogram, Arteriogram with CERAB with aortic stent extended distally with Gore excluder and bilateral CIA stenting and  angioplasty 1 Day Post-Op   Maximally revascularized  Bilateral groin access sites without swelling or hematoma BLE well perfused and warm with palpable right PT, palpable left DP Increased sensation and less pain in right foot Would allow toes to demarcate but will defer to Dr. Malvin for toe management Continue Aspirin , statin, Plavix  From vascular standpoint she is stable for discharge We will arrange follow up in 1 month CTA EVAR protocol  Teretha Damme, PA-C [facility] [phone] 01/24/2024 8:28 AM  VASCULAR STAFF ADDENDUM: I have independently interviewed and examined the patient. I agree with the above.  Renal duplex today with normal flow bilaterally. Cr this morning 0.5 Palpable R PT and L DP  Norman GORMAN Serve MD [facility] Phone Number: [phone] 01/24/2024 12:50 PM

## 2024-01-25 ENCOUNTER — Other Ambulatory Visit: Payer: Self-pay | Admitting: *Deleted

## 2024-01-25 ENCOUNTER — Encounter (HOSPITAL_COMMUNITY): Payer: Self-pay | Admitting: Vascular Surgery

## 2024-01-25 DIAGNOSIS — Z8679 Personal history of other diseases of the circulatory system: Secondary | ICD-10-CM

## 2024-01-25 MED FILL — Ipratropium-Albuterol Nebu Soln 0.5-2.5(3) MG/3ML: RESPIRATORY_TRACT | Qty: 3 | Status: AC

## 2024-01-25 MED FILL — Albuterol Sulfate Soln Nebu 0.083% (2.5 MG/3ML): RESPIRATORY_TRACT | Qty: 3 | Status: AC

## 2024-01-26 LAB — TYPE AND SCREEN
ABO/RH(D): A POS
Antibody Screen: NEGATIVE
Unit division: 0
Unit division: 0

## 2024-01-26 LAB — BPAM RBC
Blood Product Expiration Date: 202512052359
Blood Product Expiration Date: 202512122359
Unit Type and Rh: 6200
Unit Type and Rh: 6200

## 2024-01-29 ENCOUNTER — Ambulatory Visit: Admitting: Podiatry

## 2024-01-29 ENCOUNTER — Encounter (HOSPITAL_COMMUNITY): Payer: Self-pay | Admitting: Internal Medicine

## 2024-01-29 ENCOUNTER — Emergency Department (HOSPITAL_COMMUNITY)

## 2024-01-29 ENCOUNTER — Ambulatory Visit

## 2024-01-29 ENCOUNTER — Other Ambulatory Visit: Payer: Self-pay

## 2024-01-29 ENCOUNTER — Telehealth: Payer: Self-pay | Admitting: Podiatry

## 2024-01-29 ENCOUNTER — Encounter: Payer: Self-pay | Admitting: Podiatry

## 2024-01-29 ENCOUNTER — Inpatient Hospital Stay (HOSPITAL_COMMUNITY)
Admission: EM | Admit: 2024-01-29 | Discharge: 2024-02-06 | DRG: 854 | Disposition: A | Discharge status: Skilled Nursing Facility | Attending: Internal Medicine | Admitting: Internal Medicine

## 2024-01-29 VITALS — BP 139/75 | HR 103 | Temp 101.1°F

## 2024-01-29 DIAGNOSIS — E1151 Type 2 diabetes mellitus with diabetic peripheral angiopathy without gangrene: Secondary | ICD-10-CM

## 2024-01-29 DIAGNOSIS — I96 Gangrene, not elsewhere classified: Secondary | ICD-10-CM | POA: Diagnosis present

## 2024-01-29 DIAGNOSIS — Z9049 Acquired absence of other specified parts of digestive tract: Secondary | ICD-10-CM

## 2024-01-29 DIAGNOSIS — K219 Gastro-esophageal reflux disease without esophagitis: Secondary | ICD-10-CM | POA: Diagnosis present

## 2024-01-29 DIAGNOSIS — J42 Unspecified chronic bronchitis: Secondary | ICD-10-CM | POA: Diagnosis not present

## 2024-01-29 DIAGNOSIS — I739 Peripheral vascular disease, unspecified: Secondary | ICD-10-CM

## 2024-01-29 DIAGNOSIS — N179 Acute kidney failure, unspecified: Secondary | ICD-10-CM | POA: Diagnosis present

## 2024-01-29 DIAGNOSIS — Z860100 Personal history of colon polyps, unspecified: Secondary | ICD-10-CM

## 2024-01-29 DIAGNOSIS — I129 Hypertensive chronic kidney disease with stage 1 through stage 4 chronic kidney disease, or unspecified chronic kidney disease: Secondary | ICD-10-CM | POA: Diagnosis present

## 2024-01-29 DIAGNOSIS — F05 Delirium due to known physiological condition: Secondary | ICD-10-CM | POA: Diagnosis present

## 2024-01-29 DIAGNOSIS — Z7982 Long term (current) use of aspirin: Secondary | ICD-10-CM

## 2024-01-29 DIAGNOSIS — N182 Chronic kidney disease, stage 2 (mild): Secondary | ICD-10-CM | POA: Diagnosis present

## 2024-01-29 DIAGNOSIS — I252 Old myocardial infarction: Secondary | ICD-10-CM

## 2024-01-29 DIAGNOSIS — I2510 Atherosclerotic heart disease of native coronary artery without angina pectoris: Secondary | ICD-10-CM | POA: Diagnosis present

## 2024-01-29 DIAGNOSIS — E11649 Type 2 diabetes mellitus with hypoglycemia without coma: Secondary | ICD-10-CM | POA: Diagnosis not present

## 2024-01-29 DIAGNOSIS — Z8679 Personal history of other diseases of the circulatory system: Secondary | ICD-10-CM

## 2024-01-29 DIAGNOSIS — I10 Essential (primary) hypertension: Secondary | ICD-10-CM | POA: Diagnosis not present

## 2024-01-29 DIAGNOSIS — E1122 Type 2 diabetes mellitus with diabetic chronic kidney disease: Secondary | ICD-10-CM | POA: Diagnosis present

## 2024-01-29 DIAGNOSIS — E7849 Other hyperlipidemia: Secondary | ICD-10-CM

## 2024-01-29 DIAGNOSIS — E1165 Type 2 diabetes mellitus with hyperglycemia: Secondary | ICD-10-CM | POA: Diagnosis present

## 2024-01-29 DIAGNOSIS — E7800 Pure hypercholesterolemia, unspecified: Secondary | ICD-10-CM | POA: Diagnosis present

## 2024-01-29 DIAGNOSIS — L089 Local infection of the skin and subcutaneous tissue, unspecified: Principal | ICD-10-CM

## 2024-01-29 DIAGNOSIS — A419 Sepsis, unspecified organism: Principal | ICD-10-CM | POA: Diagnosis present

## 2024-01-29 DIAGNOSIS — E785 Hyperlipidemia, unspecified: Secondary | ICD-10-CM | POA: Diagnosis present

## 2024-01-29 DIAGNOSIS — E871 Hypo-osmolality and hyponatremia: Secondary | ICD-10-CM | POA: Diagnosis present

## 2024-01-29 DIAGNOSIS — E119 Type 2 diabetes mellitus without complications: Secondary | ICD-10-CM

## 2024-01-29 DIAGNOSIS — E1152 Type 2 diabetes mellitus with diabetic peripheral angiopathy with gangrene: Secondary | ICD-10-CM | POA: Diagnosis present

## 2024-01-29 DIAGNOSIS — J449 Chronic obstructive pulmonary disease, unspecified: Secondary | ICD-10-CM | POA: Diagnosis present

## 2024-01-29 DIAGNOSIS — Z794 Long term (current) use of insulin: Secondary | ICD-10-CM

## 2024-01-29 DIAGNOSIS — Z79899 Other long term (current) drug therapy: Secondary | ICD-10-CM

## 2024-01-29 DIAGNOSIS — Z90710 Acquired absence of both cervix and uterus: Secondary | ICD-10-CM

## 2024-01-29 DIAGNOSIS — Z91119 Patient's noncompliance with dietary regimen due to unspecified reason: Secondary | ICD-10-CM

## 2024-01-29 DIAGNOSIS — N189 Chronic kidney disease, unspecified: Secondary | ICD-10-CM

## 2024-01-29 DIAGNOSIS — F17210 Nicotine dependence, cigarettes, uncomplicated: Secondary | ICD-10-CM | POA: Diagnosis present

## 2024-01-29 LAB — I-STAT CG4 LACTIC ACID, ED: Lactic Acid, Venous: 0.7 mmol/L (ref 0.5–1.9)

## 2024-01-29 LAB — COMPREHENSIVE METABOLIC PANEL WITH GFR
ALT: 7 U/L (ref 0–44)
AST: <10 U/L — ABNORMAL LOW (ref 15–41)
Albumin: 2.7 g/dL — ABNORMAL LOW (ref 3.5–5.0)
Alkaline Phosphatase: 68 U/L (ref 38–126)
Anion gap: 12 (ref 5–15)
BUN: 6 mg/dL — ABNORMAL LOW (ref 8–23)
CO2: 26 mmol/L (ref 22–32)
Calcium: 8.8 mg/dL — ABNORMAL LOW (ref 8.9–10.3)
Chloride: 94 mmol/L — ABNORMAL LOW (ref 98–111)
Creatinine, Ser: 1.22 mg/dL — ABNORMAL HIGH (ref 0.44–1.00)
GFR, Estimated: 49 mL/min — ABNORMAL LOW (ref >60)
Glucose, Bld: 143 mg/dL — ABNORMAL HIGH (ref 70–99)
Potassium: 3.9 mmol/L (ref 3.5–5.1)
Sodium: 132 mmol/L — ABNORMAL LOW (ref 135–145)
Total Bilirubin: 0.5 mg/dL (ref 0.0–1.2)
Total Protein: 7.1 g/dL (ref 6.5–8.1)

## 2024-01-29 LAB — CBC WITH DIFFERENTIAL/PLATELET
Abs Immature Granulocytes: 0.05 K/uL (ref 0.00–0.07)
Basophils Absolute: 0 K/uL (ref 0.0–0.1)
Basophils Relative: 0 %
Eosinophils Absolute: 0.1 K/uL (ref 0.0–0.5)
Eosinophils Relative: 1 %
HCT: 35.9 % — ABNORMAL LOW (ref 36.0–46.0)
Hemoglobin: 11.7 g/dL — ABNORMAL LOW (ref 12.0–15.0)
Immature Granulocytes: 1 %
Lymphocytes Relative: 16 %
Lymphs Abs: 1.3 K/uL (ref 0.7–4.0)
MCH: 33.5 pg (ref 26.0–34.0)
MCHC: 32.6 g/dL (ref 30.0–36.0)
MCV: 102.9 fL — ABNORMAL HIGH (ref 80.0–100.0)
Monocytes Absolute: 1.2 K/uL — ABNORMAL HIGH (ref 0.1–1.0)
Monocytes Relative: 14 %
Neutro Abs: 5.8 K/uL (ref 1.7–7.7)
Neutrophils Relative %: 68 %
Platelets: 292 K/uL (ref 150–400)
RBC: 3.49 MIL/uL — ABNORMAL LOW (ref 3.87–5.11)
RDW: 12.7 % (ref 11.5–15.5)
WBC: 8.4 K/uL (ref 4.0–10.5)
nRBC: 0 % (ref 0.0–0.2)

## 2024-01-29 LAB — CBG MONITORING, ED: Glucose-Capillary: 139 mg/dL — ABNORMAL HIGH (ref 70–99)

## 2024-01-29 MED ORDER — SODIUM CHLORIDE 0.9 % IV SOLN
2.0000 g | Freq: Once | INTRAVENOUS | Status: AC
  Administered 2024-01-29: 2 g via INTRAVENOUS
  Filled 2024-01-29: qty 12.5

## 2024-01-29 MED ORDER — METOPROLOL TARTRATE 25 MG PO TABS
25.0000 mg | ORAL_TABLET | Freq: Two times a day (BID) | ORAL | Status: DC
  Administered 2024-01-29 – 2024-02-06 (×16): 25 mg via ORAL
  Filled 2024-01-29 (×16): qty 1

## 2024-01-29 MED ORDER — NICOTINE 21 MG/24HR TD PT24: 21.0000 mg | MEDICATED_PATCH | Freq: Every day | TRANSDERMAL | Status: DC

## 2024-01-29 MED ORDER — ACETAMINOPHEN 650 MG RE SUPP: 650.0000 mg | Freq: Four times a day (QID) | RECTAL | Status: DC | PRN

## 2024-01-29 MED ORDER — INSULIN ASPART 100 UNIT/ML IJ SOLN: 0.0000 [IU] | Freq: Every day | INTRAMUSCULAR | Status: DC

## 2024-01-29 MED ORDER — INSULIN GLARGINE-YFGN 100 UNIT/ML ~~LOC~~ SOLN
24.0000 [IU] | Freq: Every day | SUBCUTANEOUS | Status: DC
  Administered 2024-01-30 – 2024-02-01 (×3): 24 [IU] via SUBCUTANEOUS
  Filled 2024-01-29 (×5): qty 0.24

## 2024-01-29 MED ORDER — LACTATED RINGERS IV SOLN: INTRAVENOUS | Status: DC

## 2024-01-29 MED ORDER — INSULIN ASPART 100 UNIT/ML IJ SOLN
0.0000 [IU] | Freq: Three times a day (TID) | INTRAMUSCULAR | Status: DC
  Administered 2024-01-30 – 2024-02-03 (×6): 1 [IU] via SUBCUTANEOUS
  Administered 2024-02-03: 3 [IU] via SUBCUTANEOUS
  Administered 2024-02-04 – 2024-02-05 (×3): 2 [IU] via SUBCUTANEOUS
  Administered 2024-02-06: 1 [IU] via SUBCUTANEOUS
  Filled 2024-01-29 (×5): qty 1
  Filled 2024-01-29: qty 2
  Filled 2024-01-29: qty 3
  Filled 2024-01-29: qty 1
  Filled 2024-01-29 (×2): qty 2

## 2024-01-29 MED ORDER — SODIUM CHLORIDE 0.9% FLUSH
3.0000 mL | Freq: Two times a day (BID) | INTRAVENOUS | Status: DC
  Administered 2024-01-30 – 2024-02-06 (×14): 3 mL via INTRAVENOUS

## 2024-01-29 MED ORDER — SODIUM CHLORIDE 0.9% FLUSH: 3.0000 mL | INTRAVENOUS | Status: DC | PRN

## 2024-01-29 MED ORDER — PIPERACILLIN-TAZOBACTAM 3.375 G IVPB
3.3750 g | Freq: Three times a day (TID) | INTRAVENOUS | Status: DC
  Administered 2024-01-30 – 2024-01-31 (×4): 3.375 g via INTRAVENOUS
  Filled 2024-01-29 (×4): qty 50

## 2024-01-29 MED ORDER — PANTOPRAZOLE SODIUM 40 MG PO TBEC
40.0000 mg | DELAYED_RELEASE_TABLET | Freq: Two times a day (BID) | ORAL | Status: DC
  Administered 2024-01-30 – 2024-02-06 (×14): 40 mg via ORAL
  Filled 2024-01-29 (×14): qty 1

## 2024-01-29 MED ORDER — ASPIRIN 81 MG PO CHEW
81.0000 mg | CHEWABLE_TABLET | Freq: Every day | ORAL | Status: DC
  Administered 2024-01-29 – 2024-02-06 (×9): 81 mg via ORAL
  Filled 2024-01-29 (×9): qty 1

## 2024-01-29 MED ORDER — EZETIMIBE 10 MG PO TABS
10.0000 mg | ORAL_TABLET | Freq: Every day | ORAL | Status: DC
  Administered 2024-01-30 – 2024-02-06 (×8): 10 mg via ORAL
  Filled 2024-01-29 (×8): qty 1

## 2024-01-29 MED ORDER — HEPARIN SODIUM (PORCINE) 5000 UNIT/ML IJ SOLN
5000.0000 [IU] | Freq: Three times a day (TID) | INTRAMUSCULAR | Status: AC
  Administered 2024-01-29 – 2024-01-30 (×2): 5000 [IU] via SUBCUTANEOUS
  Filled 2024-01-29 (×2): qty 1

## 2024-01-29 MED ORDER — VANCOMYCIN HCL 1750 MG/350ML IV SOLN
1750.0000 mg | Freq: Once | INTRAVENOUS | Status: AC
  Administered 2024-01-29: 1750 mg via INTRAVENOUS
  Filled 2024-01-29: qty 350

## 2024-01-29 MED ORDER — PREGABALIN 25 MG PO CAPS
50.0000 mg | ORAL_CAPSULE | Freq: Three times a day (TID) | ORAL | Status: DC
  Administered 2024-01-30 – 2024-02-06 (×22): 50 mg via ORAL
  Filled 2024-01-29 (×22): qty 2

## 2024-01-29 MED ORDER — TRAMADOL HCL 50 MG PO TABS
50.0000 mg | ORAL_TABLET | Freq: Four times a day (QID) | ORAL | Status: DC | PRN
  Administered 2024-01-30: 50 mg via ORAL
  Filled 2024-01-29: qty 1

## 2024-01-29 MED ORDER — FENTANYL CITRATE (PF) 50 MCG/ML IJ SOSY
50.0000 ug | PREFILLED_SYRINGE | Freq: Once | INTRAMUSCULAR | Status: AC
  Administered 2024-01-29: 50 ug via INTRAVENOUS
  Filled 2024-01-29: qty 1

## 2024-01-29 MED ORDER — SODIUM CHLORIDE 0.9% FLUSH
3.0000 mL | Freq: Two times a day (BID) | INTRAVENOUS | Status: DC
  Administered 2024-01-30 – 2024-02-06 (×12): 3 mL via INTRAVENOUS

## 2024-01-29 MED ORDER — VANCOMYCIN HCL 1250 MG/250ML IV SOLN: 1250.0000 mg | INTRAVENOUS | Status: DC

## 2024-01-29 MED ORDER — SODIUM CHLORIDE 0.9 % IV SOLN: 250.0000 mL | INTRAVENOUS | Status: AC | PRN

## 2024-01-29 MED ORDER — VANCOMYCIN HCL IN DEXTROSE 1-5 GM/200ML-% IV SOLN: 1000.0000 mg | Freq: Once | INTRAVENOUS | Status: DC

## 2024-01-29 MED ORDER — ONDANSETRON HCL 4 MG/2ML IJ SOLN: 4.0000 mg | Freq: Four times a day (QID) | INTRAMUSCULAR | Status: DC | PRN

## 2024-01-29 MED ORDER — ONDANSETRON HCL 4 MG PO TABS: 4.0000 mg | ORAL_TABLET | Freq: Four times a day (QID) | ORAL | Status: DC | PRN

## 2024-01-29 MED ORDER — MONTELUKAST SODIUM 10 MG PO TABS
10.0000 mg | ORAL_TABLET | Freq: Every day | ORAL | Status: DC
  Administered 2024-01-29 – 2024-02-05 (×8): 10 mg via ORAL
  Filled 2024-01-29 (×8): qty 1

## 2024-01-29 MED ORDER — DOCUSATE SODIUM 100 MG PO CAPS
100.0000 mg | ORAL_CAPSULE | Freq: Two times a day (BID) | ORAL | Status: DC
  Administered 2024-01-29 – 2024-02-06 (×13): 100 mg via ORAL
  Filled 2024-01-29 (×16): qty 1

## 2024-01-29 MED ORDER — CLOPIDOGREL BISULFATE 75 MG PO TABS
75.0000 mg | ORAL_TABLET | Freq: Every day | ORAL | Status: DC
  Administered 2024-01-29: 75 mg via ORAL
  Filled 2024-01-29: qty 1

## 2024-01-29 MED ORDER — RANOLAZINE ER 500 MG PO TB12
500.0000 mg | ORAL_TABLET | Freq: Two times a day (BID) | ORAL | Status: DC
  Administered 2024-01-29 – 2024-02-06 (×15): 500 mg via ORAL
  Filled 2024-01-29 (×16): qty 1

## 2024-01-29 MED ORDER — IPRATROPIUM-ALBUTEROL 0.5-2.5 (3) MG/3ML IN SOLN: 3.0000 mL | Freq: Four times a day (QID) | RESPIRATORY_TRACT | Status: DC | PRN

## 2024-01-29 MED ORDER — ACETAMINOPHEN 325 MG PO TABS
650.0000 mg | ORAL_TABLET | Freq: Four times a day (QID) | ORAL | Status: DC | PRN
  Administered 2024-02-04 – 2024-02-06 (×2): 650 mg via ORAL
  Filled 2024-01-29 (×2): qty 2

## 2024-01-29 MED ORDER — AMLODIPINE BESYLATE 5 MG PO TABS
5.0000 mg | ORAL_TABLET | Freq: Every day | ORAL | Status: DC
  Administered 2024-01-30 – 2024-02-06 (×8): 5 mg via ORAL
  Filled 2024-01-29 (×8): qty 1

## 2024-01-29 MED ORDER — BUDESON-GLYCOPYRROL-FORMOTEROL 160-9-4.8 MCG/ACT IN AERO
2.0000 | INHALATION_SPRAY | Freq: Two times a day (BID) | RESPIRATORY_TRACT | Status: DC
  Administered 2024-02-01 – 2024-02-06 (×10): 2 via RESPIRATORY_TRACT
  Filled 2024-01-29: qty 5.9

## 2024-01-29 NOTE — H&P (Signed)
 History and Physical    Brandy Hall FMW:[id number] DOB: Jul 19, 1960 DOA: 01/29/2024  PCP: Marelyn Quill, MD   Patient coming from: Home   Chief Complaint:  Chief Complaint  Patient presents with   Wound Check   ED TRIAGE note: Patient via EMS at [facility] for eval of gangrenous toes x 4 on right foot.             HPI:  Brandy Hall is a 63 y.o. female with medical history significant of DM type II, CAD, PAD s/p bilateral common iliac stenting 11/18, COPD, ongoing smoking cigarette, essential hypertension, hyperlipidemia and chronic wound of the right lower extremity presented to emergency department [facility] Dr. Danton for evaluation for right sided foot infection/blackish discoloration concern for development of gangrene. Patient reported febrile episode at home however in the ED patient is afebrile currently.  Patient denies any chill, nausea, vomiting, abdominal pain chest pain, shortness of breath and palpitation.  Reportedly stopped smoking 2 weeks ago.  Per chart review patient has bilateral common iliac stent placement on 01/23/2024 by Dr. Sheree.  Surgical site has some blackish discoloration however peripheral Doppler has good pulsation per Dr. Ginger.  Currently patient is on DAPT aspirin  and Plavix .   ED Course:  At presentation to ED patient is hemodynamically stable.  Afebrile. Lab work, CBC unremarkable no evidence of leukocytosis. CMP showing elevated creatinine 1.22, low sodium 132, low calcium 8.8, low albumin 2.7 otherwise unremarkable. Normal lactic acid level.  Blood cultures are in process.  X-ray of the right foot showed: 1. Substantial soft tissue swelling dorsally along the ankle and foot. 2. Soft tissue volume loss distally in the second toe with possible ulceration; correlate with point tenderness and visual inspection. 3. No bony destructive or erosive findings characteristic of  active osteomyelitis.   In the ED patient received vancomycin  and cefepime , due to the pain patient received fentanyl  50 mcg.  ED will consulted and discussed case with podiatry and Dr. Malvin for plan for surgical intervention possibly on Wednesday based on OR availability.  Recommended to treat with IV antibiotics and at this time there is no indication to involve vascular surgery given patient has good peripheral blast on Doppler.  Hospitalist consulted for further evaluation management of right-sided foot infection/gangrene and AKI.   Significant labs in the ED: Lab Orders         Blood culture (routine x 2)         Comprehensive metabolic panel         CBC with Differential         Urinalysis, w/ Reflex to Culture (Infection Suspected) -Urine, Clean Catch         Creatinine, urine, random         Sodium, urine, random         CBC         Comprehensive metabolic panel       :  Review of Systems  Constitutional:  Positive for fever. Negative for chills, malaise/fatigue and weight loss.  Respiratory:  Negative for cough, hemoptysis, sputum production and shortness of breath.   Cardiovascular:  Positive for leg swelling. Negative for chest pain, palpitations, orthopnea and claudication.  Gastrointestinal:  Negative for abdominal pain, blood in stool, constipation, diarrhea, heartburn, nausea and vomiting.  Musculoskeletal:  Negative for back pain and joint pain.       Right-sided foot and ankle pain  Neurological:  Negative for dizziness  and headaches.    Past Medical History:  Diagnosis Date   Allergic rhinitis    COPD (chronic obstructive pulmonary disease) (HCC)    Diabetes (HCC)    GERD (gastroesophageal reflux disease)    History of colon polyps    Hypercholesteremia    Hypertension    IBS (irritable bowel syndrome)    MI (myocardial infarction) (HCC) 2016   Seasonal allergies    Trigeminal neuralgia     Past Surgical History:  Procedure Laterality Date    ABDOMINAL AORTOGRAM N/A 08/16/2023   Procedure: ABDOMINAL AORTOGRAM;  Surgeon: Lanis Fonda BRAVO, MD;  Location: [facility] INVASIVE CV LAB;  Service: Cardiovascular;  Laterality: N/A;   ABDOMINAL AORTOGRAM N/A 09/06/2023   Procedure: ABDOMINAL AORTOGRAM;  Surgeon: Lanis Fonda BRAVO, MD;  Location: [facility] INVASIVE CV LAB;  Service: Cardiovascular;  Laterality: N/A;   ABDOMINAL HYSTERECTOMY     BREAST CYST ASPIRATION Right    COLONOSCOPY  09/15/2014   [facility]. Multiple small polyps in the transverse and sigmoid colon, not removed today as the patient is on clopidogrel    ELBOW SURGERY     bilat   ESOPHAGEAL DILATION  08/17/2023   Procedure: DILATION, ESOPHAGUS;  Surgeon: Shila Gustav GAILS, MD;  Location: [facility] ENDOSCOPY;  Service: Gastroenterology;;   ESOPHAGOGASTRODUODENOSCOPY  02/06/2014   Small hiatal hernia. Mild gastritis. Gastric polyp status post polypectomy.   ESOPHAGOGASTRODUODENOSCOPY N/A 08/17/2023   Procedure: EGD (ESOPHAGOGASTRODUODENOSCOPY);  Surgeon: Nandigam, Kavitha V, MD;  Location: [facility] ENDOSCOPY;  Service: Gastroenterology;  Laterality: N/A;   GALLBLADDER SURGERY     INSERTION OF ILIAC STENT Bilateral 01/23/2024   Procedure: INSERTION OF BILATERAL ILIAC STENT, ARTERY;  Surgeon: Sheree Penne Bruckner, MD;  Location: [facility] OR;  Service: Vascular;  Laterality: Bilateral;   KNEE SURGERY     left   LAPAROSCOPY  1999   showed endometriosis   LOWER EXTREMITY ANGIOGRAPHY N/A 08/16/2023   Procedure: Lower Extremity Angiography;  Surgeon: Lanis Fonda BRAVO, MD;  Location: [facility] INVASIVE CV LAB;  Service: Cardiovascular;  Laterality: N/A;   LOWER EXTREMITY ANGIOGRAPHY Left 09/06/2023   Procedure: Lower Extremity Angiography;  Surgeon: Lanis Fonda BRAVO, MD;  Location: [facility] INVASIVE CV LAB;  Service: Cardiovascular;  Laterality: Left;   OOPHORECTOMY  11/15/2013   Dr Sherrine- and salpingectomy   ROTATOR CUFF REPAIR     left   ULTRASOUND GUIDANCE FOR VASCULAR ACCESS Bilateral 01/23/2024   Procedure:  ULTRASOUND GUIDANCE, FOR VASCULAR ACCESS, BILATERAL FEMORAL ARTERIES;  Surgeon: Sheree Penne Bruckner, MD;  Location: [facility] OR;  Service: Vascular;  Laterality: Bilateral;     reports that she has been smoking cigarettes. She has a 12.5 pack-year smoking history. She has never used smokeless tobacco. She reports that she does not drink alcohol and does not use drugs.  Allergies  Allergen Reactions   Crestor [Rosuvastatin] Other (See Comments)    Myalgias    Percocet [Oxycodone -Acetaminophen ] Hives   Statins Other (See Comments)    Per patient shuts down organs   Trulicity [Dulaglutide] Nausea And Vomiting, Nausea Only and Other (See Comments)    Pancreatitis     History reviewed. No pertinent family history.  Prior to Admission medications   Medication Sig Start Date End Date Taking? Authorizing Provider  albuterol  (PROVENTIL  HFA;VENTOLIN  HFA) 108 (90 BASE) MCG/ACT inhaler Inhale 2 puffs into the lungs every 6 (six) hours as needed for wheezing or shortness of breath.    [provider]  amLODipine  (NORVASC ) 5 MG tablet Take 1 tablet (5 mg total)  by mouth daily. 01/24/24   Pokhrel, Laxman, MD  amoxicillin -clavulanate (AUGMENTIN ) 875-125 MG tablet Take 1 tablet by mouth 2 (two) times daily for 5 days. 01/24/24 01/29/24  Pokhrel, Vernal, MD  ASPIRIN  81 PO Take 81 mg by mouth daily.    [provider]  Cholecalciferol  (VITAMIN D -3 PO) Take 1 tablet by mouth daily.    [provider]  clopidogrel  (PLAVIX ) 75 MG tablet Take 75 mg by mouth daily.    [provider]  docusate sodium  (COLACE) 100 MG capsule Take 1 capsule (100 mg total) by mouth daily. 01/25/24   Pokhrel, Laxman, MD  ezetimibe  (ZETIA ) 10 MG tablet Take 10 mg by mouth daily. 06/28/23   [provider]  Fluticasone-Umeclidin-Vilant (TRELEGY ELLIPTA) 200-62.5-25 MCG/ACT AEPB Inhale 1 puff into the lungs daily in the afternoon.    [provider]  Insulin  Aspart FlexPen  (NOVOLOG ) 100 UNIT/ML Inject 0-10 Units into the skin with breakfast, with lunch, and with evening meal. Sliding Scale 08/14/23   [provider]  isosorbide  mononitrate (IMDUR ) 30 MG 24 hr tablet Take 30 mg by mouth daily.    [provider]  LANTUS  SOLOSTAR 100 UNIT/ML Solostar Pen Inject 24 Units into the skin daily. 07/21/15   [provider]  lisinopril  (ZESTRIL ) 5 MG tablet Take 10 mg by mouth daily. 07/17/23   [provider]  metoprolol  tartrate (LOPRESSOR ) 25 MG tablet Take 25 mg by mouth 2 (two) times daily.    [provider]  montelukast  (SINGULAIR ) 10 MG tablet Take 1 tablet by mouth at bedtime.    [provider]  Multiple Vitamin (MULTIVITAMIN) capsule Take 1 capsule by mouth daily.    [provider]  nicotine  (NICODERM CQ  - DOSED IN MG/24 HOURS) 21 mg/24hr patch Place 1 patch (21 mg total) onto the skin daily. 01/25/24   Pokhrel, Laxman, MD  nicotine  polacrilex (NICORETTE ) 2 MG gum Take 1 each (2 mg total) by mouth as needed for smoking cessation. 01/24/24   Pokhrel, Laxman, MD  nitroGLYCERIN  (NITROSTAT ) 0.4 MG SL tablet Place 0.4 mg under the tongue every 5 (five) minutes as needed for chest pain.    [provider]  pantoprazole  (PROTONIX ) 40 MG tablet Take 1 tablet (40 mg total) by mouth 2 (two) times daily before a meal. 08/17/23   Tawkaliyar, Roya, DO  potassium chloride  (KLOR-CON ) 10 MEQ tablet Take 10 mEq by mouth daily.    [provider]  pregabalin  (LYRICA ) 50 MG capsule Take 1 capsule (50 mg total) by mouth 3 (three) times daily. 01/24/24 02/23/24  Pokhrel, Vernal, MD  RANEXA  500 MG 12 hr tablet Take 500 mg by mouth 2 (two) times daily. 08/08/23   [provider]  traMADol  (ULTRAM ) 50 MG tablet Take 1 tablet (50 mg total) by mouth every 6 (six) hours as needed for severe pain (pain score 7-10) or moderate pain (pain score 4-6). 01/24/24   Pokhrel, Vernal, MD     Physical Exam: Vitals:    01/29/24 1532  BP: (!) 155/88  Pulse: 99  Resp: 18  Temp: 99 F (37.2 C)  TempSrc: Oral  SpO2: 100%    Physical Exam Constitutional:      General: She is not in acute distress.    Appearance: Normal appearance. She is not ill-appearing.  HENT:     Mouth/Throat:     Mouth: Mucous membranes are moist.  Eyes:     Pupils: Pupils are equal, round, and reactive to light.  Cardiovascular:     Rate and Rhythm: Normal rate and regular rhythm.     Pulses: Normal pulses.     Heart sounds: Normal heart sounds.  Abdominal:     Palpations: Abdomen is soft.  Musculoskeletal:        General: Tenderness present. No swelling or deformity.     Cervical back: Neck supple.     Right lower leg: Edema present.     Left lower leg: No edema.  Skin:    Capillary Refill: Capillary refill takes less than 2 seconds.  Neurological:     Mental Status: She is alert and oriented to person, place, and time.  Psychiatric:        Mood and Affect: Mood normal.      Media Information   Document Information  Photos    01/29/2024 14:16  Attached To:  Office Visit on 01/29/24 with Lamount Ethan CROME, DPM  Source Information  Lamount Ethan CROME, [place]  [facility] [place]     Media Information   Document Information  Photos    01/29/2024 14:16  Attached To:  Office Visit on 01/29/24 with Lamount Ethan CROME, DPM  Source Information  Lamount Ethan CROME, [place]  [facility]   Labs on Admission: I have personally reviewed following labs and imaging studies  CBC: Recent Labs  Lab 01/23/24 0431 01/23/24 0930 01/24/24 0345 01/29/24 1524  WBC 7.4  --  8.9 8.4  NEUTROABS  --   --   --  5.8  HGB 11.7* 11.6* 11.0* 11.7*  HCT 35.8* 34.0* 34.6* 35.9*  MCV 105.3*  --  105.8* 102.9*  PLT 326  --  304 292   Basic Metabolic Panel: Recent Labs  Lab 01/23/24 0431 01/23/24 0930 01/24/24 0345 01/29/24 1524  NA 135 142 137 132*  K 4.0 3.5 4.2 3.9  CL 99  --  103 94*  CO2 26  --  23 26  GLUCOSE 270*   --  153* 143*  BUN 6*  --  5* 6*  CREATININE 0.72  --  0.59 1.22*  CALCIUM 8.8*  --  8.3* 8.8*  MG 1.9  --   --   --   PHOS 2.6  --   --   --    GFR: Estimated Creatinine Clearance: 51.1 mL/min (A) (by C-G formula based on SCr of 1.22 mg/dL (H)). Liver Function Tests: Recent Labs  Lab 01/29/24 1524  AST <10*  ALT 7  ALKPHOS 68  BILITOT 0.5  PROT 7.1  ALBUMIN 2.7*   No results for input(s): LIPASE, AMYLASE in the last 168 hours. No results for input(s): AMMONIA in the last 168 hours. Coagulation Profile: No results for input(s): INR, PROTIME in the last 168 hours. Cardiac Enzymes: No results for input(s): CKTOTAL, CKMB, CKMBINDEX, TROPONINI, TROPONINIHS in the last 168 hours. BNP (last 3 results) No results for input(s): BNP in the last 8760 hours. HbA1C: No results for input(s): HGBA1C in the last 72 hours. CBG: Recent Labs  Lab 01/23/24 1107 01/23/24 1536 01/23/24 2126 01/24/24 0615 01/24/24 1130  GLUCAP 103* 80 189* 113* 172*   Lipid Profile: No results for input(s): CHOL, HDL, LDLCALC, TRIG, CHOLHDL, LDLDIRECT in the last 72 hours. Thyroid Function Tests: No results for input(s): TSH, T4TOTAL, FREET4, T3FREE, THYROIDAB in the last 72 hours. Anemia Panel: No results for input(s): VITAMINB12, FOLATE, FERRITIN, TIBC, IRON, RETICCTPCT in the last 72 hours. Urine analysis:    Component Value Date/Time   COLORURINE AMBER (A) 08/16/2023  1510   APPEARANCEUR TURBID (A) 08/16/2023 1510   LABSPEC 1.016 08/16/2023 1510   PHURINE 6.0 08/16/2023 1510   GLUCOSEU NEGATIVE 08/16/2023 1510   HGBUR SMALL (A) 08/16/2023 1510   BILIRUBINUR NEGATIVE 08/16/2023 1510   KETONESUR NEGATIVE 08/16/2023 1510   PROTEINUR 100 (A) 08/16/2023 1510   NITRITE POSITIVE (A) 08/16/2023 1510   LEUKOCYTESUR LARGE (A) 08/16/2023 1510    Radiological Exams on Admission: I have personally reviewed images DG Foot Complete Right Result  Date: 01/29/2024 EXAM: 3 OR MORE VIEW(S) XRAY OF THE _LATERALITY_ FOOT 01/29/2024 05:31:00 PM COMPARISON: None available. CLINICAL HISTORY: Fever, concern for gangrene in the foot. Does have a dopplerable pulse after recent arterial stenting. FINDINGS: BONES AND JOINTS: No bony destructive or erosive findings characteristic of active osteomyelitis. No acute fracture. No focal osseous lesion. No joint dislocation. SOFT TISSUES: Substantial soft tissue swelling dorsally along the ankle and foot. Prominent soft tissue volume loss distally in the second toe, cannot exclude ulceration, correlate with visual inspection. IMPRESSION: 1. Substantial soft tissue swelling dorsally along the ankle and foot. 2. Soft tissue volume loss distally in the second toe with possible ulceration; correlate with point tenderness and visual inspection. 3. No bony destructive or erosive findings characteristic of active osteomyelitis. Electronically signed by: Ryan Salvage MD 01/29/2024 06:26 PM EST RP Workstation: [id number]   DG Foot Complete Right Result Date: 01/29/2024 Please see detailed radiograph report in office note.    Assessment/Plan: Principal Problem:   Gangrene of toe of right foot (HCC) Active Problems:   Acute kidney injury superimposed on chronic kidney disease   Peripheral artery disease s/p right common Ilac stent placement 11/18   Essential hypertension   Insulin  dependent type 2 diabetes mellitus (HCC)   COPD (chronic obstructive pulmonary disease) (HCC)   Hyperlipidemia   History of CAD (coronary artery disease)   Continuous dependence on cigarette smoking    Assessment and Plan: Gangrene of the toes of the right foot -Patient was referred from right foot clinic for evaluation of the right foot toes gangrene.  Physical exam showing blackish discoloration of the all toes.  At presentation to ED patient is hemodynamically stable.  CBC no evidence of leukocytosis. -X-ray of the right foot  showed soft tissue swelling of the ankle and foot.  Soft tissue volume loss distally in the second toe with possible ulceration. No evidence of active osteomyelitis - Dr. Ginger consulted discussed case with Dr. Malvin plan for TMP amputation on Wednesday.  Recommended to treat with IV antibiotic.  Patient has recent right common Ilac artery stent placement 1 week ago currently on aspirin  and Plavix .  Bedside Doppler showed good peripheral pulse however there is some blackish discoloration around the femoral area.  Obtaining ABI index of the sided extremity.  Per podiatry at this time there is no need for vascular surgeon at this time. -Lab work showed evidence of AKI.  No evidence of leukocytosis.  Normal lactic acid level.  Blood cultures are in process.  At this time there is no concern for sepsis. - In the ED patient received IV vancomycin  and cefepime . - Blood cultures has been obtained. - Continue IV vancomycin  and Zosyn .  Need to follow-up with blood cultures result. -In the ED patient received fentanyl  50 mcg.  Continue Tylenol , and tramadol . -Patient will be n.p.o. after midnight on Tuesday. -Appreciate podiatry input.  Acute kidney injury CKD stage II Elevated creatinine 1.22.  Prerenal acute kidney injury in the setting of infection.  Patient  is also complaining about some urinary burning sensation.  Checking UA, urine creatinine and sodium. - Starting maintenance fluid LR 75 cc/h.  Hyponatremia - Low sodium 132. - Hyponatremia in the setting of AKI.  Continue to monitor.  History of PAD status post right common Ilac stent placement 11/18 -Recent right elective stent placement 1 week ago plan to continue aspirin  and Plavix  to prevent stent thrombosis.  Continue ezetimibe .  Essential hypertension -Blood pressure borderline elevated.  Continue amlodipine  and metoprolol .  Holding Lasix and lisinopril  in the setting of AKI.     History of CAD -Continue metoprolol , aspirin ,  Plavix  and ezetimibe . -Continue Ranexa .  Insulin -dependent DM type II Continue long and short acting insulin  with mealtime coverage.  History of COPD-stable -Stable.  Continue DuoNeb as needed and Breztri   History of smoking cigarettes  -Patient quitted smoking 2 weeks ago.  Hyperlipidemia -Continue ezetimibe    DVT prophylaxis:  SQ Heparin  Code Status:  Full Code Diet: Heart healthy and carb modified diet. Family Communication:   Family was present at bedside, at the time of interview. Opportunity was given to ask question and all questions were answered satisfactorily.  Disposition Plan: Need to follow-up with blood culture result. Consults: Podiatry.  Postsurgery need to consult PT and OT Admission status:   Inpatient, Telemetry bed  Severity of Illness: The appropriate patient status for this patient is INPATIENT. Inpatient status is judged to be reasonable and necessary in order to provide the required intensity of service to ensure the patient's safety. The patient's presenting symptoms, physical exam findings, and initial radiographic and laboratory data in the context of their chronic comorbidities is felt to place them at high risk for further clinical deterioration. Furthermore, it is not anticipated that the patient will be medically stable for discharge from the hospital within 2 midnights of admission.   * I certify that at the point of admission it is my clinical judgment that the patient will require inpatient hospital care spanning beyond 2 midnights from the point of admission due to high intensity of service, high risk for further deterioration and high frequency of surveillance required.DEWAINE    Izel Eisenhardt, MD [facility]  How to contact the [facility] Attending or Consulting provider 7A - 7P or covering provider during after hours 7P -7A, for this patient.  Check the care team in [facility] and look for a) attending/consulting [facility] provider listed and b) the [facility] team  listed Log into [email or web address] and use [facility]'s universal password to access. If you do not have the password, please contact the hospital operator. Locate the [facility] provider you are looking for under [facility] and page to a number that you can be directly reached. If you still have difficulty reaching the provider, please page the [facility] (Director on Call) for the Hospitalists listed on amion for assistance.  01/29/2024, 8:00 PM

## 2024-01-29 NOTE — Telephone Encounter (Signed)
 Dr. Loel patient, daughter called, would like to have patient seen at [facility] office today, is it possible for Dr. Lamount to see patient at 1:45 today that is the only slot available on his schedule, due to Dr. Loel is in [place] today, diabetic, right foot swelling, blisters and red, just came from hospital yesterday

## 2024-01-29 NOTE — ED Provider Triage Note (Signed)
 Emergency Medicine Provider Triage Evaluation Note  Brandy Hall , a 63 y.o. female  was evaluated in triage.  Pt complains of fever, right foot pain, and recent right arterial leg stenting.  Review of Systems  Positive: Fevers, chills, urinary frequency, foot discoloration and redness going up the leg, severe pain Negative: Nausea, vomiting, congestion, cough, chest pain, shortness of  Physical Exam  BP (!) 155/88 (BP Location: Right Arm)   Pulse 99   Temp 99 F (37.2 C) (Oral)   Resp 18   SpO2 100%  Gen:   Awake, no distress   Resp:  Normal effort  MSK:   Black and blistered toes on right foot with severe pain.  I was able to get a Doppler DP and PT pulse on the right leg.  Decree sensation in some toes. Other:  Redness going up the foot and shin  Medical Decision Making  Medically screening exam initiated at 4:29 PM.  Appropriate orders placed.  Brandy Hall was informed that the remainder of the evaluation will be completed by another provider, this initial triage assessment does not replace that evaluation, and the importance of remaining in the ED until their evaluation is complete.  Brandy Hall is a 63 y.o. female with a past medical history significant for hypertension, hypercholesterolemia, diabetes, CAD with previous MI, GERD, hysterectomy, cholecystectomy, recent vascular stenting in the right leg on 01/23/2024 who was sent from podiatry for fever and concern for gangrenous foot.  According to patient, she was discharged in the hospital last Thursday after having her right leg stented for a vascular occlusion and since then has been having worsening pain in her right foot.  She reports that she has had fever for the last 2 days up to 102.  She also had subjective chills.  She has not had any congestion, cough, nausea, or vomiting.  She does report some urinary frequency but no dysuria.  She reports that her toes are turning black and extremely painful.  She reports it  is blistering.  The pain is rising up the leg where she has redness streaking up past her ankle.  On my initial evaluation, lungs have wheezing.  Chest nontender.  Abdomen nontender.  Distally I could not get a DP or PT pulse on palpation and a Doppler was used and a Doppler pulse was heard in the right leg.  She has tenderness and swelling and blistering and black toes.  Severe pain and erythema going up the leg.   Clinically I am concerned about gangrene developing as was the concern of the podiatrist.  Will get screening labs, x-ray, and blood cultures.  Will get urinalysis given the frequency.  Will give her pain medicine.  She will need seen by provider.  I will order antibiotics as well.  Anticipate evaluation when seen by provider    Brandy Hall, Brandy PARAS, MD 01/29/24 [phone]

## 2024-01-29 NOTE — Progress Notes (Signed)
 Pharmacy Antibiotic Note  Brandy Hall is a 63 y.o. female for which pharmacy has been consulted for vancomycin  and zosyn  dosing for wound infection.  Patient with a history of T2DM, CAD, PAD s/p bilateral iliac stenting, COPD, HTN, HLD, chronic RLE wound. Patient sent to ED from podiatry office secondary to concern for rt foot infection w/ c/f gangrene.  SCr 1.22 -  baseline ~0.7 WBC 8.4; LA 0.7; T 99; HR 99; RR 18  Cefepime  given x 1 in ED  Plan: Zosyn  3.375g IV q8h (4 hour infusion) Vancomycin  1750 mg once then 1250 mg q24hr (eAUC 504.7) unless change in renal function --suspect Scr change in AM, may need dose adjustment Monitor WBC, fever, renal function, cultures De-escalate when able Levels at steady state     Temp (24hrs), Avg:100.1 F (37.8 C), Min:99 F (37.2 C), Max:101.1 F (38.4 C)  Recent Labs  Lab 01/23/24 0431 01/24/24 0345 01/29/24 1524 01/29/24 1532  WBC 7.4 8.9 8.4  --   CREATININE 0.72 0.59 1.22*  --   LATICACIDVEN  --   --   --  0.7    Estimated Creatinine Clearance: 51.1 mL/min (A) (by C-G formula based on SCr of 1.22 mg/dL (H)).    Allergies  Allergen Reactions   Crestor [Rosuvastatin] Other (See Comments)    Myalgias    Percocet [Oxycodone -Acetaminophen ] Hives   Statins Other (See Comments)    Per patient shuts down organs   Trulicity [Dulaglutide] Nausea And Vomiting, Nausea Only and Other (See Comments)    Pancreatitis    Microbiology results: Pending  Thank you for allowing pharmacy to be a part of this patient's care.  Dorn Buttner, PharmD, BCPS 01/29/2024 7:50 PM ED Clinical Pharmacist -  [phone]

## 2024-01-29 NOTE — ED Triage Notes (Signed)
 Patient via EMS at [facility] for eval of gangrenous toes x 4 on right foot.

## 2024-01-29 NOTE — ED Provider Notes (Signed)
 [place] EMERGENCY DEPARTMENT AT [facility] Provider Note   CSN: [id number] Arrival date & time: 01/29/24  1518     Patient presents with: Wound Check   Brandy Hall is a 63 y.o. female.   The history is provided by the patient and medical records.  Foot Pain This is a new problem. The current episode started more than 1 week ago. The problem occurs constantly. The problem has been gradually worsening. Pertinent negatives include no chest pain, no abdominal pain, no headaches and no shortness of breath. Nothing aggravates the symptoms. Nothing relieves the symptoms. She has tried nothing for the symptoms. The treatment provided no relief.       Prior to Admission medications   Medication Sig Start Date End Date Taking? Authorizing Provider  albuterol  (PROVENTIL  HFA;VENTOLIN  HFA) 108 (90 BASE) MCG/ACT inhaler Inhale 2 puffs into the lungs every 6 (six) hours as needed for wheezing or shortness of breath.    [provider]  amLODipine  (NORVASC ) 5 MG tablet Take 1 tablet (5 mg total) by mouth daily. 01/24/24   Pokhrel, Laxman, MD  amoxicillin -clavulanate (AUGMENTIN ) 875-125 MG tablet Take 1 tablet by mouth 2 (two) times daily for 5 days. 01/24/24 01/29/24  Pokhrel, Vernal, MD  ASPIRIN  81 PO Take 81 mg by mouth daily.    [provider]  Cholecalciferol  (VITAMIN D -3 PO) Take 1 tablet by mouth daily.    [provider]  clopidogrel  (PLAVIX ) 75 MG tablet Take 75 mg by mouth daily.    [provider]  docusate sodium  (COLACE) 100 MG capsule Take 1 capsule (100 mg total) by mouth daily. 01/25/24   Pokhrel, Laxman, MD  ezetimibe  (ZETIA ) 10 MG tablet Take 10 mg by mouth daily. 06/28/23   [provider]  Fluticasone-Umeclidin-Vilant (TRELEGY ELLIPTA) 200-62.5-25 MCG/ACT AEPB Inhale 1 puff into the lungs daily in the afternoon.    [provider]  Insulin  Aspart FlexPen (NOVOLOG ) 100 UNIT/ML Inject 0-10 Units into the skin  with breakfast, with lunch, and with evening meal. Sliding Scale 08/14/23   [provider]  isosorbide  mononitrate (IMDUR ) 30 MG 24 hr tablet Take 30 mg by mouth daily.    [provider]  LANTUS  SOLOSTAR 100 UNIT/ML Solostar Pen Inject 24 Units into the skin daily. 07/21/15   [provider]  lisinopril  (ZESTRIL ) 5 MG tablet Take 10 mg by mouth daily. 07/17/23   [provider]  metoprolol  tartrate (LOPRESSOR ) 25 MG tablet Take 25 mg by mouth 2 (two) times daily.    [provider]  montelukast  (SINGULAIR ) 10 MG tablet Take 1 tablet by mouth at bedtime.    [provider]  Multiple Vitamin (MULTIVITAMIN) capsule Take 1 capsule by mouth daily.    [provider]  nicotine  (NICODERM CQ  - DOSED IN MG/24 HOURS) 21 mg/24hr patch Place 1 patch (21 mg total) onto the skin daily. 01/25/24   Pokhrel, Laxman, MD  nicotine  polacrilex (NICORETTE ) 2 MG gum Take 1 each (2 mg total) by mouth as needed for smoking cessation. 01/24/24   Pokhrel, Laxman, MD  nitroGLYCERIN  (NITROSTAT ) 0.4 MG SL tablet Place 0.4 mg under the tongue every 5 (five) minutes as needed for chest pain.    [provider]  pantoprazole  (PROTONIX ) 40 MG tablet Take 1 tablet (40 mg total) by mouth 2 (two) times daily before a meal. 08/17/23   Tawkaliyar, Roya, DO  potassium chloride  (KLOR-CON ) 10 MEQ tablet Take 10 mEq by mouth daily.  [provider]  pregabalin  (LYRICA ) 50 MG capsule Take 1 capsule (50 mg total) by mouth 3 (three) times daily. 01/24/24 02/23/24  Pokhrel, Vernal, MD  RANEXA  500 MG 12 hr tablet Take 500 mg by mouth 2 (two) times daily. 08/08/23   [provider]  traMADol  (ULTRAM ) 50 MG tablet Take 1 tablet (50 mg total) by mouth every 6 (six) hours as needed for severe pain (pain score 7-10) or moderate pain (pain score 4-6). 01/24/24   Pokhrel, Laxman, MD    Allergies: Crestor [rosuvastatin], Percocet [oxycodone -acetaminophen ], Statins, and  Trulicity [dulaglutide]    Review of Systems  Constitutional:  Positive for chills, fatigue and fever.  HENT:  Negative for congestion.   Respiratory:  Negative for cough, chest tightness, shortness of breath and wheezing.   Cardiovascular:  Negative for chest pain.  Gastrointestinal:  Negative for abdominal pain, constipation, diarrhea, nausea and vomiting.  Genitourinary:  Positive for frequency.  Musculoskeletal:  Negative for back pain, neck pain and neck stiffness.  Skin:  Positive for color change and rash.  Neurological:  Positive for numbness. Negative for weakness, light-headedness and headaches.  Psychiatric/Behavioral:  Negative for agitation and confusion.   All other systems reviewed and are negative.   Updated Vital Signs BP (!) 155/88 (BP Location: Right Arm)   Pulse 99   Temp 99 F (37.2 C) (Oral)   Resp 18   SpO2 100%   Physical Exam Vitals and nursing note reviewed.  Constitutional:      General: She is not in acute distress.    Appearance: She is well-developed. She is not ill-appearing, toxic-appearing or diaphoretic.  HENT:     Head: Normocephalic and atraumatic.     Nose: No congestion or rhinorrhea.     Mouth/Throat:     Mouth: Mucous membranes are moist.     Pharynx: No oropharyngeal exudate or posterior oropharyngeal erythema.  Eyes:     Extraocular Movements: Extraocular movements intact.     Conjunctiva/sclera: Conjunctivae normal.     Pupils: Pupils are equal, round, and reactive to light.  Cardiovascular:     Rate and Rhythm: Regular rhythm. Tachycardia present.     Heart sounds: No murmur heard. Pulmonary:     Effort: Pulmonary effort is normal. No respiratory distress.     Breath sounds: Normal breath sounds. No wheezing, rhonchi or rales.  Chest:     Chest wall: No tenderness.  Abdominal:     Palpations: Abdomen is soft.     Tenderness: There is no abdominal tenderness.  Musculoskeletal:        General: Swelling and tenderness  present.     Cervical back: Neck supple. No tenderness.     Right lower leg: Edema present.     Left lower leg: No edema.  Skin:    General: Skin is warm and dry.     Capillary Refill: Capillary refill takes less than 2 seconds.     Findings: Erythema and rash present.  Neurological:     Mental Status: She is alert.     Sensory: Sensory deficit present.     Motor: No weakness.  Psychiatric:        Mood and Affect: Mood normal.     (all labs ordered are listed, but only abnormal results are displayed) Labs Reviewed  COMPREHENSIVE METABOLIC PANEL WITH GFR - Abnormal; Notable for the following components:      Result Value   Sodium 132 (*)    Chloride 94 (*)  Glucose, Bld 143 (*)    BUN 6 (*)    Creatinine, Ser 1.22 (*)    Calcium 8.8 (*)    Albumin 2.7 (*)    AST <10 (*)    GFR, Estimated 49 (*)    All other components within normal limits  CBC WITH DIFFERENTIAL/PLATELET - Abnormal; Notable for the following components:   RBC 3.49 (*)    Hemoglobin 11.7 (*)    HCT 35.9 (*)    MCV 102.9 (*)    Monocytes Absolute 1.2 (*)    All other components within normal limits  CULTURE, BLOOD (ROUTINE X 2)  CULTURE, BLOOD (ROUTINE X 2)  URINALYSIS, W/ REFLEX TO CULTURE (INFECTION SUSPECTED)  CREATININE, URINE, RANDOM  SODIUM, URINE, RANDOM  CBC  COMPREHENSIVE METABOLIC PANEL WITH GFR  I-STAT CG4 LACTIC ACID, ED    EKG: None  Radiology: DG Foot Complete Right Result Date: 01/29/2024 EXAM: 3 OR MORE VIEW(S) XRAY OF THE _LATERALITY_ FOOT 01/29/2024 05:31:00 PM COMPARISON: None available. CLINICAL HISTORY: Fever, concern for gangrene in the foot. Does have a dopplerable pulse after recent arterial stenting. FINDINGS: BONES AND JOINTS: No bony destructive or erosive findings characteristic of active osteomyelitis. No acute fracture. No focal osseous lesion. No joint dislocation. SOFT TISSUES: Substantial soft tissue swelling dorsally along the ankle and foot. Prominent soft tissue  volume loss distally in the second toe, cannot exclude ulceration, correlate with visual inspection. IMPRESSION: 1. Substantial soft tissue swelling dorsally along the ankle and foot. 2. Soft tissue volume loss distally in the second toe with possible ulceration; correlate with point tenderness and visual inspection. 3. No bony destructive or erosive findings characteristic of active osteomyelitis. Electronically signed by: Ryan Salvage MD 01/29/2024 06:26 PM EST RP Workstation: [id number]   DG Foot Complete Right Result Date: 01/29/2024 Please see detailed radiograph report in office note.    Procedures   CRITICAL CARE Performed by: Lonni PARAS Doloris Servantes Total critical care time: 30 minutes Critical care time was exclusive of separately billable procedures and treating other patients. Critical care was necessary to treat or prevent imminent or life-threatening deterioration. Critical care was time spent personally by me on the following activities: development of treatment plan with patient and/or surrogate as well as nursing, discussions with consultants, evaluation of patient's response to treatment, examination of patient, obtaining history from patient or surrogate, ordering and performing treatments and interventions, ordering and review of laboratory studies, ordering and review of radiographic studies, pulse oximetry and re-evaluation of patient's condition.   Medications Ordered in the ED  aspirin  chewable tablet 81 mg (has no administration in time range)  traMADol  (ULTRAM ) tablet 50 mg (has no administration in time range)  ezetimibe  (ZETIA ) tablet 10 mg (has no administration in time range)  metoprolol  tartrate (LOPRESSOR ) tablet 25 mg (has no administration in time range)  ranolazine  (RANEXA ) 12 hr tablet 500 mg (has no administration in time range)  insulin  glargine-yfgn (SEMGLEE ) injection 24 Units (has no administration in time range)  pantoprazole  (PROTONIX ) EC tablet  40 mg (has no administration in time range)  clopidogrel  (PLAVIX ) tablet 75 mg (has no administration in time range)  pregabalin  (LYRICA ) capsule 50 mg (has no administration in time range)  budesonide -glycopyrrolate -formoterol  (BREZTRI ) 160-9-4.8 MCG/ACT inhaler 2 puff (has no administration in time range)  ipratropium-albuterol  (DUONEB) 0.5-2.5 (3) MG/3ML nebulizer solution 3 mL (has no administration in time range)  montelukast  (SINGULAIR ) tablet 10 mg (has no administration in time range)  heparin  injection 5,000 Units (has no administration  in time range)  lactated ringers  infusion (has no administration in time range)  sodium chloride  flush (NS) 0.9 % injection 3 mL (has no administration in time range)  sodium chloride  flush (NS) 0.9 % injection 3 mL (has no administration in time range)  sodium chloride  flush (NS) 0.9 % injection 3 mL (has no administration in time range)  0.9 %  sodium chloride  infusion (has no administration in time range)  acetaminophen  (TYLENOL ) tablet 650 mg (has no administration in time range)    Or  acetaminophen  (TYLENOL ) suppository 650 mg (has no administration in time range)  ondansetron  (ZOFRAN ) tablet 4 mg (has no administration in time range)    Or  ondansetron  (ZOFRAN ) injection 4 mg (has no administration in time range)  docusate sodium  (COLACE) capsule 100 mg (has no administration in time range)  insulin  aspart (novoLOG ) injection 0-9 Units (has no administration in time range)  insulin  aspart (novoLOG ) injection 0-5 Units (has no administration in time range)  amLODipine  (NORVASC ) tablet 5 mg (has no administration in time range)  vancomycin  (VANCOREADY) IVPB 1250 mg/250 mL (has no administration in time range)  piperacillin -tazobactam (ZOSYN ) IVPB 3.375 g (has no administration in time range)  fentaNYL  (SUBLIMAZE ) injection 50 mcg (50 mcg Intravenous Given 01/29/24 1810)  ceFEPIme  (MAXIPIME ) 2 g in sodium chloride  0.9 % 100 mL IVPB (0 g  Intravenous Stopped 01/29/24 1853)  vancomycin  (VANCOREADY) IVPB 1750 mg/350 mL (0 mg Intravenous Stopped 01/29/24 2047)                                    Medical Decision Making Amount and/or Complexity of Data Reviewed Labs: ordered. Radiology: ordered.  Risk Prescription drug management. Decision regarding hospitalization.    AMAYRANY CAFARO is a 63 y.o. female with a past medical history significant for hypertension, hypercholesterolemia, diabetes, CAD with previous MI, GERD, hysterectomy, cholecystectomy, recent vascular stenting in the right leg on 01/23/2024 who was sent from podiatry for fever and concern for gangrenous foot.  According to patient, she was discharged in the hospital last Thursday after having her right leg stented for a vascular occlusion and since then has been having worsening pain in her right foot.  She reports that she has had fever for the last 2 days up to 102.  She also had subjective chills.  She has not had any congestion, cough, nausea, or vomiting.  She does report some urinary frequency but no dysuria.  She reports that her toes are turning black and extremely painful.  She reports it is blistering.  The pain is rising up the leg where she has redness streaking up past her ankle.   On my initial evaluation, lungs have wheezing.  Chest nontender.  Abdomen nontender.  Distally I could not get a DP or PT pulse on palpation and a Doppler was used and a Doppler pulse was heard in the right leg.  She has tenderness and swelling and blistering and black toes.  Severe pain and erythema going up the leg.    Clinically I am concerned about gangrene developing as was the concern of the podiatrist.  Will get screening labs, x-ray, and blood cultures.  Will get urinalysis given the frequency.  Will give her pain medicine.  6:32 PM I spoke to Dr. Malvin with podiatry who stated they will plan on taking the patient to do an amputation of some degree of the right  lower extremity on Wednesday.  They agree with medicine admission.  Will call for medicine admission      Final diagnoses:  Right foot infection    Clinical Impression: 1. Right foot infection     Disposition: Admit  This note was prepared with assistance of Dragon voice recognition software. Occasional wrong-word or sound-a-like substitutions may have occurred due to the inherent limitations of voice recognition software.     Aneeka Bowden, Lonni PARAS, MD 01/29/24 2150

## 2024-01-29 NOTE — Progress Notes (Signed)
  Subjective:  Patient ID: Brandy Hall, female    DOB: 1960-04-17,  MRN: [id number]  Chief Complaint  Patient presents with   blisters    Right foot first and second toe has a blister on the top pt has a lot of swelling in foot she stated that the tips of her toes are black  her foot is warm to the touch. she stated that she had a procedure done to try and open up the blood flow to her foot she stated that she is in a lot of pain     Discussed the use of AI scribe software for clinical note transcription with the patient, who gave verbal consent to proceed.  History of Present Illness Brandy Hall is a 63 year old female with diabetes and arterial disease who presents with worsening gangrene in the right foot.  The big toe, third toe, and a portion of the fourth toe on her right foot have turned black with no sensation. She underwent a procedure to improve blood flow in her feet approximately one week ago, but blistering and color changes appeared a few days post-procedure. These symptoms have persisted for about two months.  She experiences significant pain in the affected areas, describing it as severe. There is no fever, nausea, vomiting, or chills.  Poorly controlled diabetes.  A1c 8.7.      Objective:    Physical Exam EXTREMITIES: Right foot warm with swelling with significant cooling of the toes distally. Right forefoot extremely tender on palpation. Gangrene developing on right foot toes 1-4.  Bulla formation about the base of the first toe dorsally and encompassing the second toe noted appears ischemic in nature PT pulse faintly palpable, DP pulse difficult to palpate due to edema due to pain         Results    Assessment:   1. Gangrene of right foot (HCC)   2. Type II diabetes mellitus with peripheral circulatory disorder [facility])      Plan:  Patient was evaluated and treated and all questions answered.  Assessment and Plan Assessment & Plan Right foot  gangrene due to peripheral arterial disease Gangrene in the right foot due to peripheral arterial disease, with color changes and blistering on the toes. Likely due to small vessel disease as larger vessels have been addressed. Condition is painful and requires urgent attention. - Arranged ambulance transport to the hospital for urgent evaluation. - Recommend arterial ultrasound to evaluate foot circulation and vascular surgery consultation.  Status post bilateral iliac stenting with Dr. Sheree -May benefit from MRI to evaluate for osteomyelitis however believe underlying etiology is ischemic in nature - Notified podiatry team for further management - Monitor closely for potential need for amputation, likely at midfoot level pending demarcation of tissues.  Peripheral arterial disease of lower extremities Peripheral arterial disease affecting lower extremities, contributing to gangrene in the right foot. Poor circulation, particularly in small vessels, resulting in ischemia and gangrene. - Continue monitoring circulation and response to previous procedure. - Evaluate vascular studies to assess current blood flow status.      No follow-ups on file.

## 2024-01-29 NOTE — ED Notes (Signed)
 Hospitalist at bedside

## 2024-01-30 ENCOUNTER — Encounter (HOSPITAL_COMMUNITY): Payer: Self-pay | Admitting: Internal Medicine

## 2024-01-30 ENCOUNTER — Inpatient Hospital Stay (HOSPITAL_COMMUNITY)

## 2024-01-30 DIAGNOSIS — I96 Gangrene, not elsewhere classified: Secondary | ICD-10-CM

## 2024-01-30 DIAGNOSIS — I739 Peripheral vascular disease, unspecified: Secondary | ICD-10-CM

## 2024-01-30 LAB — URINALYSIS, W/ REFLEX TO CULTURE (INFECTION SUSPECTED)
Bacteria, UA: NONE SEEN
Bilirubin Urine: NEGATIVE
Glucose, UA: 150 mg/dL — AB
Hgb urine dipstick: NEGATIVE
Ketones, ur: NEGATIVE mg/dL
Leukocytes,Ua: NEGATIVE
Nitrite: NEGATIVE
Protein, ur: 30 mg/dL — AB
Specific Gravity, Urine: 1.01 (ref 1.005–1.030)
pH: 6 (ref 5.0–8.0)

## 2024-01-30 LAB — COMPREHENSIVE METABOLIC PANEL WITH GFR
ALT: 7 U/L (ref 0–44)
AST: 10 U/L — ABNORMAL LOW (ref 15–41)
Albumin: 2.5 g/dL — ABNORMAL LOW (ref 3.5–5.0)
Alkaline Phosphatase: 65 U/L (ref 38–126)
Anion gap: 13 (ref 5–15)
BUN: 6 mg/dL — ABNORMAL LOW (ref 8–23)
CO2: 20 mmol/L — ABNORMAL LOW (ref 22–32)
Calcium: 8.7 mg/dL — ABNORMAL LOW (ref 8.9–10.3)
Chloride: 99 mmol/L (ref 98–111)
Creatinine, Ser: 0.97 mg/dL (ref 0.44–1.00)
GFR, Estimated: >60 mL/min (ref >60)
Glucose, Bld: 138 mg/dL — ABNORMAL HIGH (ref 70–99)
Potassium: 4.1 mmol/L (ref 3.5–5.1)
Sodium: 132 mmol/L — ABNORMAL LOW (ref 135–145)
Total Bilirubin: 0.9 mg/dL (ref 0.0–1.2)
Total Protein: 6.6 g/dL (ref 6.5–8.1)

## 2024-01-30 LAB — CBG MONITORING, ED: Glucose-Capillary: 129 mg/dL — ABNORMAL HIGH (ref 70–99)

## 2024-01-30 LAB — CBC
HCT: 35.3 % — ABNORMAL LOW (ref 36.0–46.0)
Hemoglobin: 11.4 g/dL — ABNORMAL LOW (ref 12.0–15.0)
MCH: 34 pg (ref 26.0–34.0)
MCHC: 32.3 g/dL (ref 30.0–36.0)
MCV: 105.4 fL — ABNORMAL HIGH (ref 80.0–100.0)
Platelets: 270 K/uL (ref 150–400)
RBC: 3.35 MIL/uL — ABNORMAL LOW (ref 3.87–5.11)
RDW: 12.5 % (ref 11.5–15.5)
WBC: 7.6 K/uL (ref 4.0–10.5)
nRBC: 0 % (ref 0.0–0.2)

## 2024-01-30 LAB — GLUCOSE, CAPILLARY: Glucose-Capillary: 107 mg/dL — ABNORMAL HIGH (ref 70–99)

## 2024-01-30 LAB — SODIUM, URINE, RANDOM: Sodium, Ur: 78 mmol/L

## 2024-01-30 LAB — CREATININE, URINE, RANDOM: Creatinine, Urine: 47 mg/dL

## 2024-01-30 LAB — VAS US ABI WITH/WO TBI
Left ABI: 0.86
Right ABI: 0.96

## 2024-01-30 MED ORDER — ADULT MULTIVITAMIN W/MINERALS CH
1.0000 | ORAL_TABLET | Freq: Every day | ORAL | Status: DC
  Administered 2024-01-30 – 2024-02-06 (×8): 1 via ORAL
  Filled 2024-01-30 (×8): qty 1

## 2024-01-30 MED ORDER — LACTATED RINGERS IV SOLN: INTRAVENOUS | Status: AC

## 2024-01-30 MED ORDER — VANCOMYCIN HCL 1500 MG/300ML IV SOLN
1500.0000 mg | INTRAVENOUS | Status: DC
  Administered 2024-01-30: 1500 mg via INTRAVENOUS
  Filled 2024-01-30 (×2): qty 300

## 2024-01-30 MED ORDER — MORPHINE SULFATE (PF) 2 MG/ML IV SOLN: 2.0000 mg | INTRAVENOUS | Status: DC | PRN

## 2024-01-30 MED ORDER — TRAMADOL HCL 50 MG PO TABS
50.0000 mg | ORAL_TABLET | Freq: Four times a day (QID) | ORAL | Status: AC | PRN
  Administered 2024-01-30 – 2024-02-04 (×9): 50 mg via ORAL
  Filled 2024-01-30 (×11): qty 1

## 2024-01-30 MED ORDER — CLOPIDOGREL BISULFATE 75 MG PO TABS
75.0000 mg | ORAL_TABLET | Freq: Every day | ORAL | Status: DC
  Administered 2024-01-30 – 2024-02-06 (×8): 75 mg via ORAL
  Filled 2024-01-30 (×8): qty 1

## 2024-01-30 MED ORDER — MULTIVITAMINS PO CAPS: 1.0000 | ORAL_CAPSULE | Freq: Every day | ORAL | Status: DC

## 2024-01-30 MED ORDER — HYDRALAZINE HCL 20 MG/ML IJ SOLN: 5.0000 mg | Freq: Four times a day (QID) | INTRAMUSCULAR | Status: AC | PRN

## 2024-01-30 MED ORDER — MORPHINE SULFATE (PF) 2 MG/ML IV SOLN
2.0000 mg | INTRAVENOUS | Status: AC | PRN
  Administered 2024-02-01: 2 mg via INTRAVENOUS
  Filled 2024-01-30: qty 1

## 2024-01-30 NOTE — Progress Notes (Addendum)
 PROGRESS NOTE  [name]  FMW:[id number] DOB: [birth year] DOA: 01/29/2024 PCP: Marelyn Quill, MD   [name] is a 62 year old female with history of CAD, PAD status post bilateral common femoral iliac stenting on 01/23/2024, COPD, hypertension, ongoing tobacco smoking, hyperlipidemia, who presents emergency department for chief concerns of right foot wound at the advice of podiatry.  01/29/2024: Presented to the emergency department  11/24: Admitted to [facility] for gangrene of the toes on right foot. Per admitting hospitalist, Dr.Tegeler consulted and discussed with Dr. Malvin for TMP amputation on Wednesday, 11/26. Vancomycin  and Zosyn  were initiated. Patient also treated for acute kidney injury on CKD stage I, hyponatremia.  11/25: I assumed care of the patient.  Heart healthy/carb modified diet placed. N.p.o. after midnight order placed.  Continue antibiotic.  Continue Plavix /ASA and is okay per podiatry.   Assessment & Plan:   Principal Problem:   Gangrene of toe of right foot (HCC) Active Problems:   Acute kidney injury superimposed on chronic kidney disease   Peripheral artery disease s/p right common Ilac stent placement 11/18   Essential hypertension   Insulin  dependent type 2 diabetes mellitus (HCC)   COPD (chronic obstructive pulmonary disease) (HCC)   Hyperlipidemia   History of CAD (coronary artery disease)   Continuous dependence on cigarette smoking   Assessment and Plan:  * Gangrene of toe of right foot (HCC) With ongoing tobacco use Podiatry is aware Patient okay to continue dual antiplatelet therapy per podiatry Right lower extremity ultrasound for ABI has been ordered Continue vancomycin  and Zosyn  per pharmacy Tramadol  50 mg every 6 hours as needed for moderate pain, 5 days ordered; morphine  2 mg IV every 4 hours as needed for severe pain, 1 day ordered; morphine  2 mg IV every 4 hours as needed for severe pain, 2 day  ordered  Peripheral artery disease s/p right common Ilac stent placement 11/18 On aspirin  and Plavix  Discussed with podiatry that this would be okay prior to TMA  Acute kidney injury superimposed on chronic kidney disease 11/25: Resolved Given patient will be n.p.o. after midnight and going for TMA tomorrow, I have reordered LR infusion at 125 mL/h, 10 hours ordered. Will recheck BMP in the a.m.  Continuous dependence on cigarette smoking Counseling at bedside regarding the benefits of tobacco cessation in setting of PAD and gangrene of the right foot.  I discussed that if patient continues to use cigarettes, this will delay/prevent wound healing which could cause worsening amputation including whole foot amputation, leg amputation etc.  Patient endorses understanding and compliance  History of CAD (coronary artery disease) Aspirin  81 mg, Plavix  75 mg daily  Hyperlipidemia Ezetimibe  10 mg  COPD (chronic obstructive pulmonary disease) (HCC) Patient is not in acute exacerbation Long-acting inhaler/maintenance inhaler were resumed DuoNebs every 6 hours as needed for wheezing and shortness of breath  Insulin  dependent type 2 diabetes mellitus (HCC) Insulin  long-acting, 24 units daily, insulin  SSI with at bedtime coverage was initiated on admission Goal inpatient blood glucose levels 140-180  Essential hypertension Amlodipine  5 mg daily, metoprolol  tartrate 25 mg p.o. twice daily  DVT prophylaxis: Heparin  to stop at 2200 hrs. today.  AM team to reinitiate pharmacologic DVT prophylaxis when benefits outweigh the risk Code Status: Full code Family Communication: Updated daughter, Brandy Hall at bedside with patient's permission Disposition Plan: Pending clinical course Level of care: Telemetry  Consultants:  Podiatry  Procedures:  None at the  Antimicrobials: Vancomycin , Zosyn   Subjective:  At bedside, patient was  able to tell me her first and last name, age, location, current  calendar year.  She was able to identify her daughter, Brandy Hall at bedside.  She reports she has no complaints at this time.  She understands that she will need total amputation tomorrow.  I explained the benefit of discontinuation of tobacco use as this will promote more wound healing.  Objective: Vitals:   01/30/24 0900 01/30/24 1000 01/30/24 1210 01/30/24 1529  BP: 121/70 (!) 150/76 (!) 119/90 121/80  Pulse: 73 79 72 80  Resp: 17 (!) 22 18 17   Temp:   99 F (37.2 C) 97.9 F (36.6 C)  TempSrc:   Oral   SpO2: 100% 98% 99% 96%    Intake/Output Summary (Last 24 hours) at 01/30/2024 1619 Last data filed at 01/30/2024 1604 Gross per 24 hour  Intake 1314.21 ml  Output --  Net 1314.21 ml   There were no vitals filed for this visit.  Examination:  General exam: Appears calm and comfortable  Respiratory system: Clear to auscultation. Respiratory effort normal. Cardiovascular system: S1 & S2 heard, RRR. No JVD, murmurs, rubs, gallops or clicks. No pedal edema. Gastrointestinal system: Abdomen is nondistended, soft and nontender. No organomegaly or masses felt. Normal bowel sounds heard. Central nervous system: Alert and oriented. No focal neurological deficits. Extremities: Symmetric 5 x 5 power. Skin:  right toes     Psychiatry: Judgement and insight appear normal. Mood & affect appropriate.   Data Reviewed: I have personally reviewed following labs and imaging studies  CBC: Recent Labs  Lab 01/24/24 0345 01/29/24 1524 01/30/24 0303  WBC 8.9 8.4 7.6  NEUTROABS  --  5.8  --   HGB 11.0* 11.7* 11.4*  HCT 34.6* 35.9* 35.3*  MCV 105.8* 102.9* 105.4*  PLT 304 292 270   Basic Metabolic Panel: Recent Labs  Lab 01/24/24 0345 01/29/24 1524 01/30/24 0303  NA 137 132* 132*  K 4.2 3.9 4.1  CL 103 94* 99  CO2 23 26 20*  GLUCOSE 153* 143* 138*  BUN 5* 6* 6*  CREATININE 0.59 1.22* 0.97  CALCIUM 8.3* 8.8* 8.7*   GFR: Estimated Creatinine Clearance: 64.3 mL/min (by C-G  formula based on SCr of 0.97 mg/dL).  Liver Function Tests: Recent Labs  Lab 01/29/24 1524 01/30/24 0303  AST <10* 10*  ALT 7 7  ALKPHOS 68 65  BILITOT 0.5 0.9  PROT 7.1 6.6  ALBUMIN 2.7* 2.5*   CBG: Recent Labs  Lab 01/24/24 0615 01/24/24 1130 01/29/24 2256 01/30/24 0744 01/30/24 1301  GLUCAP 113* 172* 139* 129* 107*   Sepsis Labs: Recent Labs  Lab 01/29/24 1532  LATICACIDVEN 0.7   Recent Results (from the past 240 hours)  Surgical pcr screen     Status: None   Collection Time: 01/23/24  5:05 AM   Specimen: Nasal Mucosa; Nasal Swab  Result Value Ref Range Status   MRSA, PCR NEGATIVE NEGATIVE Final   Staphylococcus aureus NEGATIVE NEGATIVE Final    Comment: (NOTE) The Xpert SA Assay (FDA approved for NASAL specimens in patients 57 years of age and older), is one component of a comprehensive surveillance program. It is not intended to diagnose infection nor to guide or monitor treatment. Performed at [facility], [address], [place], [place] [address]   Blood culture (routine x 2)     Status: None (Preliminary result)   Collection Time: 01/29/24  5:38 PM   Specimen: BLOOD  Result Value Ref Range Status   Specimen  Description BLOOD SITE NOT SPECIFIED  Final   Special Requests   Final    BOTTLES DRAWN AEROBIC AND ANAEROBIC Blood Culture results may not be optimal due to an inadequate volume of blood received in culture bottles   Culture   Final    NO GROWTH < 12 HOURS Performed at [facility], [address], [place], [place] [address]    Report Status PENDING  Incomplete  Blood culture (routine x 2)     Status: None (Preliminary result)   Collection Time: 01/29/24  6:00 PM   Specimen: BLOOD RIGHT HAND  Result Value Ref Range Status   Specimen Description BLOOD RIGHT HAND  Final   Special Requests   Final    BOTTLES DRAWN AEROBIC AND ANAEROBIC Blood Culture results may not be optimal due to an inadequate volume of blood received in culture  bottles   Culture   Final    NO GROWTH < 12 HOURS Performed at [facility], [address] [place], [place] [address]    Report Status PENDING  Incomplete    Radiology Studies: VAS US  ABI WITH/WO TBI Result Date: 01/30/2024  LOWER EXTREMITY DOPPLER STUDY Patient Name:  HEYLI MIN  Date of Exam:   01/30/2024 Medical Rec #: [id number]         Accession #:    [id number] Date of Birth: 06-21-1960         Patient Gender: F Patient Age:   74 years Exam Location:  [facility] Procedure:      VAS US  ABI WITH/WO TBI Referring Phys: SUBRINA SUNDIL --------------------------------------------------------------------------------  Indications: Peripheral artery disease. High Risk Factors: Hypertension, hyperlipidemia, Diabetes, current smoker.  Vascular Interventions: 01/23/2024 - INSERTION OF BILATERAL ILIAC STENT, ARTERY                         ULTRASOUND GUIDANCE, FOR VASCULAR ACCESS, BILATERAL                         FEMORAL ARTERIES. Comparison Study: 01/19/2024 - Right: Resting right ankle-brachial index                   indicates severe right lower                   extremity arterial disease.                    Left: Resting left ankle-brachial index indicates mild left                   lower                   extremity arterial disease. The left toe-brachial index is                   normal. Performing Technologist: Gerome Ny RVT  Examination Guidelines: A complete evaluation includes at minimum, Doppler waveform signals and systolic blood pressure reading at the level of bilateral brachial, anterior tibial, and posterior tibial arteries, when vessel segments are accessible. Bilateral testing is considered an integral part of a complete examination. Photoelectric Plethysmograph (PPG) waveforms and toe systolic pressure readings are included as required and additional duplex testing as needed. Limited examinations for reoccurring indications may be performed as noted.  ABI Findings:  +---------+------------------+-----+-----------+--------+ Right    Rt Pressure (mmHg)IndexWaveform   Comment  +---------+------------------+-----+-----------+--------+ Brachial 131  triphasic           +---------+------------------+-----+-----------+--------+ PTA      133               0.83 multiphasic         +---------+------------------+-----+-----------+--------+ DP       154               0.96 multiphasic         +---------+------------------+-----+-----------+--------+ Great Toe                       Absent              +---------+------------------+-----+-----------+--------+ +---------+------------------+-----+-----------+-------+ Left     Lt Pressure (mmHg)IndexWaveform   Comment +---------+------------------+-----+-----------+-------+ Brachial 161                    triphasic          +---------+------------------+-----+-----------+-------+ PTA      138               0.86 multiphasic        +---------+------------------+-----+-----------+-------+ DP       129               0.80 multiphasic        +---------+------------------+-----+-----------+-------+ Great Toe110               0.68                    +---------+------------------+-----+-----------+-------+ +-------+-----------+-----------+------------+------------+ ABI/TBIToday's ABIToday's TBIPrevious ABIPrevious TBI +-------+-----------+-----------+------------+------------+ Right  0.96       Absent                              +-------+-----------+-----------+------------+------------+ Left   0.86       0.68                                +-------+-----------+-----------+------------+------------+  Summary: Right: Resting right ankle-brachial index is within normal range.  Unable to obtain TBI due to absent waveforms. Left: Resting left ankle-brachial index indicates mild left lower extremity arterial disease. The left toe-brachial index is abnormal.  *See  table(s) above for measurements and observations.  Electronically signed by Penne Colorado MD on 01/30/2024 at 3:59:48 PM.    Final    DG Foot Complete Right Result Date: 01/29/2024 EXAM: 3 OR MORE VIEW(S) XRAY OF THE _LATERALITY_ FOOT 01/29/2024 05:31:00 PM COMPARISON: None available. CLINICAL HISTORY: Fever, concern for gangrene in the foot. Does have a dopplerable pulse after recent arterial stenting. FINDINGS: BONES AND JOINTS: No bony destructive or erosive findings characteristic of active osteomyelitis. No acute fracture. No focal osseous lesion. No joint dislocation. SOFT TISSUES: Substantial soft tissue swelling dorsally along the ankle and foot. Prominent soft tissue volume loss distally in the second toe, cannot exclude ulceration, correlate with visual inspection. IMPRESSION: 1. Substantial soft tissue swelling dorsally along the ankle and foot. 2. Soft tissue volume loss distally in the second toe with possible ulceration; correlate with point tenderness and visual inspection. 3. No bony destructive or erosive findings characteristic of active osteomyelitis. Electronically signed by: Ryan Salvage MD 01/29/2024 06:26 PM EST RP Workstation: [id number]   DG Foot Complete Right Result Date: 01/29/2024 Please see detailed radiograph report in office note.  Scheduled Meds:  amLODipine   5 mg Oral Daily   aspirin   81 mg  Oral Daily   budesonide -glycopyrrolate -formoterol   2 puff Inhalation BID   clopidogrel   75 mg Oral Daily   docusate sodium   100 mg Oral BID   ezetimibe   10 mg Oral Daily   heparin   5,000 Units Subcutaneous Q8H   insulin  aspart  0-5 Units Subcutaneous QHS   insulin  aspart  0-9 Units Subcutaneous TID WC   insulin  glargine-yfgn  24 Units Subcutaneous Daily   metoprolol  tartrate  25 mg Oral BID   montelukast   10 mg Oral QHS   multivitamin with minerals  1 tablet Oral Daily   pantoprazole   40 mg Oral BID AC   pregabalin   50 mg Oral TID   ranolazine   500 mg Oral BID    sodium chloride  flush  3 mL Intravenous Q12H   sodium chloride  flush  3 mL Intravenous Q12H   Continuous Infusions:  sodium chloride      lactated ringers      piperacillin -tazobactam 12.5 mL/hr at 01/30/24 1348   vancomycin       LOS: 1 day   Time spent: 50 minutes  Dr. Sherre [facility] If 7PM-7AM, please contact night-coverage 01/30/2024, 4:19 PM

## 2024-01-30 NOTE — Assessment & Plan Note (Signed)
 Counseling at bedside regarding the benefits of tobacco cessation in setting of PAD and gangrene of the right foot.  I discussed that if patient continues to use cigarettes, this will delay/prevent wound healing which could cause worsening amputation including whole foot amputation, leg amputation etc.  Patient endorses understanding and compliance

## 2024-01-30 NOTE — Assessment & Plan Note (Signed)
 Patient is not in acute exacerbation Long-acting inhaler/maintenance inhaler were resumed DuoNebs every 6 hours as needed for wheezing and shortness of breath

## 2024-01-30 NOTE — Assessment & Plan Note (Signed)
 On aspirin  and Plavix  Discussed with podiatry that this would be okay prior to TMA

## 2024-01-30 NOTE — Progress Notes (Signed)
  Progress Note    01/30/2024 4:21 PM   Subjective:  concerned about surgery  Vitals:   01/30/24 1210 01/30/24 1529  BP: (!) 119/90 121/80  Pulse: 72 80  Resp: 18 17  Temp: 99 F (37.2 C) 97.9 F (36.6 C)  SpO2: 99% 96%    Physical Exam: Aaox3 B groins are soft without hematoma Palpable R pt, L dp Blistering and ongoing ischemic changes toes 1-4 on the right  CBC    Component Value Date/Time   WBC 7.6 01/30/2024 0303   RBC 3.35 (L) 01/30/2024 0303   HGB 11.4 (L) 01/30/2024 0303   HGB 14.0 12/20/2023 1104   HCT 35.3 (L) 01/30/2024 0303   HCT 42.0 12/20/2023 1104   PLT 270 01/30/2024 0303   PLT 213 12/20/2023 1104   MCV 105.4 (H) 01/30/2024 0303   MCV 106 (H) 12/20/2023 1104   MCH 34.0 01/30/2024 0303   MCHC 32.3 01/30/2024 0303   RDW 12.5 01/30/2024 0303   RDW 13.2 12/20/2023 1104   LYMPHSABS 1.3 01/29/2024 1524   LYMPHSABS 1.0 12/20/2023 1104   MONOABS 1.2 (H) 01/29/2024 1524   EOSABS 0.1 01/29/2024 1524   EOSABS 0.0 12/20/2023 1104   BASOSABS 0.0 01/29/2024 1524   BASOSABS 0.0 12/20/2023 1104    BMET    Component Value Date/Time   NA 132 (L) 01/30/2024 0303   NA 140 12/20/2023 1104   K 4.1 01/30/2024 0303   CL 99 01/30/2024 0303   CO2 20 (L) 01/30/2024 0303   GLUCOSE 138 (H) 01/30/2024 0303   BUN 6 (L) 01/30/2024 0303   BUN 7 (L) 12/20/2023 1104   CREATININE 0.97 01/30/2024 0303   CALCIUM 8.7 (L) 01/30/2024 0303   GFRNONAA >60 01/30/2024 0303    INR No results found for: INR   Intake/Output Summary (Last 24 hours) at 01/30/2024 1621 Last data filed at 01/30/2024 1604 Gross per 24 hour  Intake 1314.21 ml  Output --  Net 1314.21 ml     Assessment:  63 y.o. female is s/p:  1. Covered endovascular reconstruction of aortic bifurcation (CERAB) with aortic stent Cook Zenith alpha 24 x 105 extended distally with Gore conformable excluder cuff 26 x 4.5 cm.  Bilateral common iliac artery stenting with 8L x 59 mm VBX postdilated proximally  with 14 mm balloons   2.  Percutaneous access and closure bilateral common femoral arteries with Pro-glide devices  Plan: Agree with need for R tma and this was reiterated to patient and she demonstrates good understanding but remains nervous and reluctant.    Ekaterini Capitano C. Sheree, MD [facility] Office: [phone] Pager: [phone]  01/30/2024 4:21 PM

## 2024-01-30 NOTE — Anesthesia Preprocedure Evaluation (Addendum)
 Anesthesia Evaluation  Patient identified by MRN, date of birth, ID band Patient awake    Reviewed: Allergy & Precautions, NPO status , Patient's Chart, lab work & pertinent test results  History of Anesthesia Complications Negative for: history of anesthetic complications  Airway Mallampati: II  TM Distance: >3 FB Neck ROM: Full    Dental no notable dental hx. (+) Teeth Intact, Dental Advisory Given   Pulmonary sleep apnea , COPD,  COPD inhaler, Current Smoker and Patient abstained from smoking.   Pulmonary exam normal breath sounds clear to auscultation       Cardiovascular hypertension, Pt. on medications (-) angina + CAD and + Peripheral Vascular Disease  (-) Past MI Normal cardiovascular exam Rhythm:Regular Rate:Normal     Neuro/Psych  Headaches    GI/Hepatic Neg liver ROS,GERD  ,,  Endo/Other  diabetes, Type 2, Insulin  Dependent    Renal/GU negative Renal ROSLab Results      Component                Value               Date                                          K                        4.1                 01/30/2024                CO2                      20 (L)              01/30/2024                BUN                      6 (L)               01/30/2024                CREATININE               0.97                01/30/2024                GFRNONAA                 >60                 01/30/2024                                Musculoskeletal  (+) Arthritis ,    Abdominal   Peds  Hematology Lab Results      Component                Value               Date                      WBC  7.6                 01/30/2024                HGB                      11.4 (L)            01/30/2024                HCT                      35.3 (L)            01/30/2024                MCV                      105.4 (H)           01/30/2024                PLT                      270                  01/30/2024              Anesthesia Other Findings   Reproductive/Obstetrics                              Anesthesia Physical Anesthesia Plan  ASA: 3  Anesthesia Plan: MAC and Regional   Post-op Pain Management: Tylenol  PO (pre-op)*, Precedex and Regional block*   Induction:   PONV Risk Score and Plan: 2 and Treatment may vary due to age or medical condition and Propofol  infusion  Airway Management Planned: Natural Airway and Nasal Cannula  Additional Equipment: None  Intra-op Plan:   Post-operative Plan:   Informed Consent: I have reviewed the patients History and Physical, chart, labs and discussed the procedure including the risks, benefits and alternatives for the proposed anesthesia with the patient or authorized representative who has indicated his/her understanding and acceptance.     Dental advisory given  Plan Discussed with: CRNA and Surgeon  Anesthesia Plan Comments: (R pop + mac)         Anesthesia Quick Evaluation

## 2024-01-30 NOTE — Assessment & Plan Note (Addendum)
 Amlodipine  5 mg daily, metoprolol  tartrate 25 mg p.o. twice daily

## 2024-01-30 NOTE — Progress Notes (Signed)
 ABI's have been completed. Preliminary results can be found in CV Proc through chart review.   01/30/24 11:08 AM Cathlyn Collet RVT

## 2024-01-30 NOTE — Assessment & Plan Note (Addendum)
 11/25: Resolved Given patient will be n.p.o. after midnight and going for TMA tomorrow, I have reordered LR infusion at 125 mL/h, 10 hours ordered. Will recheck BMP in the a.m.

## 2024-01-30 NOTE — Consult Note (Signed)
 PODIATRY CONSULTATION  NAME Brandy Hall MRN [id number] DOB 1960-04-19 DOA 01/29/2024   Reason for consult:  Chief Complaint  Patient presents with   Wound Check    Attending/Consulting physician: A. Cox DO  History of present illness: Brandy Hall is a 63 y.o. female with medical history significant of DM type II, CAD, PAD s/p bilateral common iliac stenting 11/18, COPD, ongoing smoking cigarette, essential hypertension, hyperlipidemia and chronic wound of the right lower extremity presented to emergency department [facility]  for evaluation for right sided foot infection/blackish discoloration concern for development of gangrene.   Patient known to me from prior admission plan was to allow demarcation of the right foot gangrene which appears to have done so as evidenced by worsening gangrenous changes of digits 1 through 4 on the right foot.  At this point due to fevers and concern for worsening infection of the right foot recommended amputation of the forefoot on the right side.  Patient is reluctant but willing to proceed.  Discussed plan for surgery tomorrow.  Past Medical History:  Diagnosis Date   Allergic rhinitis    COPD (chronic obstructive pulmonary disease) (HCC)    Diabetes (HCC)    GERD (gastroesophageal reflux disease)    History of colon polyps    Hypercholesteremia    Hypertension    IBS (irritable bowel syndrome)    MI (myocardial infarction) (HCC) 2016   Seasonal allergies    Trigeminal neuralgia        Latest Ref Rng & Units 01/30/2024    3:03 AM 01/29/2024    3:24 PM 01/24/2024    3:45 AM  CBC  WBC 4.0 - 10.5 K/uL 7.6  8.4  8.9   Hemoglobin 12.0 - 15.0 g/dL 88.5  88.2  88.9   Hematocrit 36.0 - 46.0 % 35.3  35.9  34.6   Platelets 150 - 400 K/uL 270  292  304        Latest Ref Rng & Units 01/30/2024    3:03 AM 01/29/2024    3:24 PM 01/24/2024    3:45 AM  BMP  Glucose 70 - 99 mg/dL 861  856  846   BUN 8 - 23  mg/dL 6  6  5    Creatinine 0.44 - 1.00 mg/dL 9.02  8.77  9.40   Sodium 135 - 145 mmol/L 132  132  137   Potassium 3.5 - 5.1 mmol/L 4.1  3.9  4.2   Chloride 98 - 111 mmol/L 99  94  103   CO2 22 - 32 mmol/L 20  26  23    Calcium 8.9 - 10.3 mg/dL 8.7  8.8  8.3       Physical Exam: Lower Extremity Exam  Right foot DP and PT nonpalpable  Significant erythema and edema of the forefoot.  There is some dorsal blistering at the extending of the 1st and 2nd toe.  Dry gangrenous changes of the distal plantar aspect of the 1st through 4th digits.  Pain on palpation  Sensation intact to light touch        ASSESSMENT/PLAN OF CARE 63 y.o. female with PMHx significant for   DM type II, CAD, PAD s/p bilateral common iliac stenting 11/18, COPD, ongoing smoking cigarette, essential hypertension, hyperlipidemia  with gangrene of digits 1 through 4 on the right foot.  Status post prior common iliac stenting with vascular surgery last admission  - N.p.o. past midnight for or tomorrow for right foot  transmetatarsal amputation.  Discussed risk of nonhealing amputation site requiring further amputation more proximally.  Patient agrees to proceed - Continue IV abx broad spectrum pending further culture data - Anticoagulation: Okay to continue per primary/vascular recommendations - Wound care: None required preoperatively - WB status: Will be nonweightbearing following surgery and postoperative - Will continue to follow   Thank you for the consult.  Please contact me directly with any questions or concerns.           Brandy Hall, DPM [facility]    [address] [place], [place] [address]                Office [phone]  Fax [phone]

## 2024-01-30 NOTE — Hospital Course (Addendum)
 Ms. Alaina Donati is a 63 year old female with history of CAD, PAD status post bilateral common femoral iliac stenting on 01/23/2024, COPD, hypertension, ongoing tobacco smoking, hyperlipidemia, who presents emergency department for chief concerns of right foot wound at the advice of podiatry.  01/29/2024: Presented to the emergency department  11/24: Admitted to [facility] service for gangrene of the toes on right foot. Per admitting hospitalist, Dr.Tegeler consulted and discussed with Dr. Malvin for TMP amputation on Wednesday, 11/26. Vancomycin  and Zosyn  were initiated. Patient also treated for acute kidney injury on CKD stage I, hyponatremia.  11/25: I assumed care of the patient.  Heart healthy/carb modified diet placed. N.p.o. after midnight order placed.  Continue antibiotic.  Continue Plavix /ASA and is okay per podiatry.

## 2024-01-30 NOTE — Assessment & Plan Note (Signed)
Ezetimibe 10mg 

## 2024-01-30 NOTE — Assessment & Plan Note (Addendum)
 With ongoing tobacco use Podiatry is aware Patient okay to continue dual antiplatelet therapy per podiatry Right lower extremity ultrasound for ABI has been ordered Continue vancomycin  and Zosyn  per pharmacy Tramadol  50 mg every 6 hours as needed for moderate pain, 5 days ordered; morphine  2 mg IV every 4 hours as needed for severe pain, 1 day ordered; morphine  2 mg IV every 4 hours as needed for severe pain, 2 day ordered

## 2024-01-30 NOTE — Assessment & Plan Note (Addendum)
 Aspirin  81 mg, Plavix  75 mg daily

## 2024-01-30 NOTE — Assessment & Plan Note (Signed)
 Insulin  long-acting, 24 units daily, insulin  SSI with at bedtime coverage was initiated on admission Goal inpatient blood glucose levels 140-180

## 2024-01-30 NOTE — Progress Notes (Signed)
 PHARMACY NOTE:  ANTIMICROBIAL RENAL DOSAGE ADJUSTMENT  Current antimicrobial regimen includes a mismatch between antimicrobial dosage and estimated renal function.  As per policy approved by the Pharmacy & Therapeutics and Medical Executive Committees, the antimicrobial dosage will be adjusted accordingly.  Current antimicrobial dosage:  vancomycin  1250mg  q24h (eAUC 409, Cmin 9.2)  Indication: wound infection  Renal Function:  Estimated Creatinine Clearance: 64.3 mL/min (by C-G formula based on SCr of 0.97 mg/dL). []      On intermittent HD, scheduled: []      On CRRT    Antimicrobial dosage has been changed to:  Vancomycin  1500mg  q24h (eAUC 491, Scr 0.9)  Additional comments: F/u surgical plans  Thank you for allowing pharmacy to be a part of this patient's care.  Leonor GORMAN Bash, [facility] 01/30/2024 10:45 AM

## 2024-01-30 NOTE — Plan of Care (Signed)
   Problem: Education: Goal: Knowledge of General Education information will improve Description Including pain rating scale, medication(s)/side effects and non-pharmacologic comfort measures Outcome: Progressing

## 2024-01-30 NOTE — Progress Notes (Signed)
.  [facility]  PROCEDURAL EXPEDITER PROGRESS NOTE  Patient Name: Brandy Hall  DOB:[birth year] Date of Admission: 01/29/2024  Date of Assessment:01/30/24   -------------------------------------------------------------------------------------------------------------------   Brief clinical summary: Pt to the OR tomorrow for right transmetatarsal amputation  Orders in place:  Yes   Communication with surgical team if no orders: N/A  Labs, test, and orders reviewed: Y  Requires surgical clearance:  No  Barriers noted: N/A   Intervention provided by [facility] team: N/A  Barrier resolved:  not applicable   -------------------------------------------------------------------------------------------------------------------  [employer], [place], [place] Please contact us  directly via secure chat (search for [facility]) or by calling us  at [phone] [facility]).

## 2024-01-31 ENCOUNTER — Other Ambulatory Visit: Payer: Self-pay

## 2024-01-31 ENCOUNTER — Encounter (HOSPITAL_COMMUNITY)
Admission: EM | Disposition: A | Discharge status: Skilled Nursing Facility | Payer: Self-pay | Source: Home / Self Care | Attending: Internal Medicine

## 2024-01-31 ENCOUNTER — Inpatient Hospital Stay (HOSPITAL_COMMUNITY): Payer: Self-pay | Admitting: Anesthesiology

## 2024-01-31 ENCOUNTER — Inpatient Hospital Stay (HOSPITAL_COMMUNITY)

## 2024-01-31 ENCOUNTER — Encounter (HOSPITAL_COMMUNITY): Payer: Self-pay | Admitting: Internal Medicine

## 2024-01-31 DIAGNOSIS — F17210 Nicotine dependence, cigarettes, uncomplicated: Secondary | ICD-10-CM

## 2024-01-31 DIAGNOSIS — E1152 Type 2 diabetes mellitus with diabetic peripheral angiopathy with gangrene: Secondary | ICD-10-CM | POA: Diagnosis not present

## 2024-01-31 DIAGNOSIS — I96 Gangrene, not elsewhere classified: Secondary | ICD-10-CM | POA: Diagnosis not present

## 2024-01-31 DIAGNOSIS — I2510 Atherosclerotic heart disease of native coronary artery without angina pectoris: Secondary | ICD-10-CM | POA: Diagnosis not present

## 2024-01-31 DIAGNOSIS — I10 Essential (primary) hypertension: Secondary | ICD-10-CM | POA: Diagnosis not present

## 2024-01-31 HISTORY — PX: TRANSMETATARSAL AMPUTATION: SHX6197

## 2024-01-31 LAB — CBC
HCT: 32.8 % — ABNORMAL LOW (ref 36.0–46.0)
Hemoglobin: 10.7 g/dL — ABNORMAL LOW (ref 12.0–15.0)
MCH: 33.4 pg (ref 26.0–34.0)
MCHC: 32.6 g/dL (ref 30.0–36.0)
MCV: 102.5 fL — ABNORMAL HIGH (ref 80.0–100.0)
Platelets: 319 K/uL (ref 150–400)
RBC: 3.2 MIL/uL — ABNORMAL LOW (ref 3.87–5.11)
RDW: 12.9 % (ref 11.5–15.5)
WBC: 7.4 K/uL (ref 4.0–10.5)
nRBC: 0 % (ref 0.0–0.2)

## 2024-01-31 LAB — GLUCOSE, CAPILLARY
Glucose-Capillary: 137 mg/dL — ABNORMAL HIGH (ref 70–99)
Glucose-Capillary: 84 mg/dL (ref 70–99)
Glucose-Capillary: 74 mg/dL (ref 70–99)
Glucose-Capillary: 70 mg/dL (ref 70–99)
Glucose-Capillary: 58 mg/dL — ABNORMAL LOW (ref 70–99)
Glucose-Capillary: 59 mg/dL — ABNORMAL LOW (ref 70–99)
Glucose-Capillary: 69 mg/dL — ABNORMAL LOW (ref 70–99)
Glucose-Capillary: 74 mg/dL (ref 70–99)
Glucose-Capillary: 134 mg/dL — ABNORMAL HIGH (ref 70–99)
Glucose-Capillary: 94 mg/dL (ref 70–99)

## 2024-01-31 LAB — BASIC METABOLIC PANEL WITH GFR
Anion gap: 10 (ref 5–15)
BUN: 6 mg/dL — ABNORMAL LOW (ref 8–23)
CO2: 25 mmol/L (ref 22–32)
Calcium: 8.6 mg/dL — ABNORMAL LOW (ref 8.9–10.3)
Chloride: 101 mmol/L (ref 98–111)
Creatinine, Ser: 1.13 mg/dL — ABNORMAL HIGH (ref 0.44–1.00)
GFR, Estimated: 54 mL/min — ABNORMAL LOW (ref >60)
Glucose, Bld: 56 mg/dL — ABNORMAL LOW (ref 70–99)
Potassium: 3.3 mmol/L — ABNORMAL LOW (ref 3.5–5.1)
Sodium: 136 mmol/L (ref 135–145)

## 2024-01-31 SURGERY — AMPUTATION, FOOT, TRANSMETATARSAL
Anesthesia: Monitor Anesthesia Care | Site: Toe | Laterality: Right

## 2024-01-31 MED ORDER — PROPOFOL 10 MG/ML IV BOLUS
INTRAVENOUS | Status: AC
Start: 2024-01-31 — End: 2024-01-31
  Filled 2024-01-31: qty 20

## 2024-01-31 MED ORDER — LIDOCAINE HCL (PF) 1 % IJ SOLN
INTRAMUSCULAR | Status: AC
  Filled 2024-01-31: qty 10

## 2024-01-31 MED ORDER — OXYCODONE HCL 5 MG PO TABS: 5.0000 mg | ORAL_TABLET | Freq: Once | ORAL | Status: DC | PRN

## 2024-01-31 MED ORDER — DEXTROSE 50 % IV SOLN
INTRAVENOUS | Status: AC
  Filled 2024-01-31: qty 50

## 2024-01-31 MED ORDER — ORAL CARE MOUTH RINSE: 15.0000 mL | Freq: Once | OROMUCOSAL | Status: AC

## 2024-01-31 MED ORDER — ROPIVACAINE HCL 5 MG/ML IJ SOLN
INTRAMUSCULAR | Status: DC | PRN
  Administered 2024-01-31: 30 mL via PERINEURAL

## 2024-01-31 MED ORDER — CHLORHEXIDINE GLUCONATE 0.12 % MT SOLN
OROMUCOSAL | Status: AC
  Administered 2024-01-31: 15 mL via OROMUCOSAL
  Filled 2024-01-31: qty 15

## 2024-01-31 MED ORDER — HYDROMORPHONE HCL 1 MG/ML IJ SOLN: 0.2500 mg | INTRAMUSCULAR | Status: DC | PRN

## 2024-01-31 MED ORDER — VANCOMYCIN HCL 1250 MG/250ML IV SOLN
1250.0000 mg | INTRAVENOUS | Status: DC
  Filled 2024-01-31: qty 250

## 2024-01-31 MED ORDER — SODIUM CHLORIDE 0.9 % IR SOLN
Status: DC | PRN
  Administered 2024-01-31: 1000 mL

## 2024-01-31 MED ORDER — MIDAZOLAM HCL 2 MG/2ML IJ SOLN
INTRAMUSCULAR | Status: AC
  Administered 2024-01-31: 1 mg via INTRAVENOUS
  Filled 2024-01-31: qty 2

## 2024-01-31 MED ORDER — BUPIVACAINE HCL (PF) 0.5 % IJ SOLN
INTRAMUSCULAR | Status: AC
  Filled 2024-01-31: qty 10

## 2024-01-31 MED ORDER — LACTATED RINGERS IV SOLN: INTRAVENOUS | Status: DC

## 2024-01-31 MED ORDER — CLONIDINE HCL (ANALGESIA) 100 MCG/ML EP SOLN
EPIDURAL | Status: DC | PRN
  Administered 2024-01-31: 100 ug

## 2024-01-31 MED ORDER — ACETAMINOPHEN 10 MG/ML IV SOLN: 1000.0000 mg | Freq: Once | INTRAVENOUS | Status: DC | PRN

## 2024-01-31 MED ORDER — OXYCODONE HCL 5 MG/5ML PO SOLN: 5.0000 mg | Freq: Once | ORAL | Status: DC | PRN

## 2024-01-31 MED ORDER — CHLORHEXIDINE GLUCONATE 0.12 % MT SOLN: 15.0000 mL | Freq: Once | OROMUCOSAL | Status: AC

## 2024-01-31 MED ORDER — FENTANYL CITRATE (PF) 100 MCG/2ML IJ SOLN: 50.0000 ug | Freq: Once | INTRAMUSCULAR | Status: AC

## 2024-01-31 MED ORDER — LIDOCAINE 2% (20 MG/ML) 5 ML SYRINGE
INTRAMUSCULAR | Status: AC
  Filled 2024-01-31: qty 5

## 2024-01-31 MED ORDER — PHENYLEPHRINE 80 MCG/ML (10ML) SYRINGE FOR IV PUSH (FOR BLOOD PRESSURE SUPPORT)
PREFILLED_SYRINGE | INTRAVENOUS | Status: DC | PRN
  Administered 2024-01-31: 160 ug via INTRAVENOUS
  Administered 2024-01-31: 240 ug via INTRAVENOUS
  Administered 2024-01-31: 160 ug via INTRAVENOUS

## 2024-01-31 MED ORDER — DEXTROSE 50 % IV SOLN: 6.2500 mL | Freq: Once | INTRAVENOUS | Status: DC

## 2024-01-31 MED ORDER — MIDAZOLAM HCL (PF) 2 MG/2ML IJ SOLN: 1.0000 mg | Freq: Once | INTRAMUSCULAR | Status: AC

## 2024-01-31 MED ORDER — FENTANYL CITRATE (PF) 100 MCG/2ML IJ SOLN
INTRAMUSCULAR | Status: DC | PRN
  Administered 2024-01-31 (×2): 50 ug via INTRAVENOUS

## 2024-01-31 MED ORDER — AMOXICILLIN-POT CLAVULANATE 875-125 MG PO TABS
1.0000 | ORAL_TABLET | Freq: Two times a day (BID) | ORAL | Status: AC
  Administered 2024-01-31 – 2024-02-04 (×10): 1 via ORAL
  Filled 2024-01-31 (×10): qty 1

## 2024-01-31 MED ORDER — PROPOFOL 500 MG/50ML IV EMUL
INTRAVENOUS | Status: DC | PRN
  Administered 2024-01-31: 150 ug/kg/min via INTRAVENOUS

## 2024-01-31 MED ORDER — ONDANSETRON HCL 4 MG/2ML IJ SOLN
4.0000 mg | Freq: Once | INTRAMUSCULAR | Status: DC | PRN
Start: 2024-01-31 — End: 2024-01-31

## 2024-01-31 MED ORDER — POTASSIUM CHLORIDE 10 MEQ/100ML IV SOLN
10.0000 meq | INTRAVENOUS | Status: AC
  Administered 2024-01-31 (×3): 10 meq via INTRAVENOUS
  Filled 2024-01-31 (×4): qty 100

## 2024-01-31 MED ORDER — ONDANSETRON HCL 4 MG/2ML IJ SOLN
INTRAMUSCULAR | Status: AC
  Filled 2024-01-31: qty 2

## 2024-01-31 MED ORDER — EPHEDRINE SULFATE-NACL 50-0.9 MG/10ML-% IV SOSY
PREFILLED_SYRINGE | INTRAVENOUS | Status: DC | PRN
  Administered 2024-01-31: 5 mg via INTRAVENOUS

## 2024-01-31 MED ORDER — AMISULPRIDE (ANTIEMETIC) 5 MG/2ML IV SOLN: 10.0000 mg | Freq: Once | INTRAVENOUS | Status: DC | PRN

## 2024-01-31 MED ORDER — FENTANYL CITRATE (PF) 100 MCG/2ML IJ SOLN
INTRAMUSCULAR | Status: AC
  Administered 2024-01-31: 50 ug via INTRAVENOUS
  Filled 2024-01-31: qty 2

## 2024-01-31 MED ORDER — FENTANYL CITRATE (PF) 100 MCG/2ML IJ SOLN
INTRAMUSCULAR | Status: AC
  Filled 2024-01-31: qty 2

## 2024-01-31 SURGICAL SUPPLY — 43 items
ALLOGRAFT AMNI BIOVANCE 5X5 1L (Graft) IMPLANT
BLADE AVERAGE 25X9 (BLADE) IMPLANT
BLADE SURG 10 STRL SS (BLADE) ×2 IMPLANT
BLADE SURG 15 STRL LF DISP TIS (BLADE) ×2 IMPLANT
BNDG COHESIVE 3X5 TAN ST LF (GAUZE/BANDAGES/DRESSINGS) ×2 IMPLANT
BNDG COMPR ESMARK 4X3 LF (GAUZE/BANDAGES/DRESSINGS) ×2 IMPLANT
BNDG ELASTIC 3INX 5YD STR LF (GAUZE/BANDAGES/DRESSINGS) ×2 IMPLANT
BNDG ELASTIC 4INX 5YD STR LF (GAUZE/BANDAGES/DRESSINGS) IMPLANT
BNDG GAUZE DERMACEA FLUFF 4 (GAUZE/BANDAGES/DRESSINGS) IMPLANT
CHLORAPREP W/TINT 26 (MISCELLANEOUS) IMPLANT
COVER LIGHT HANDLE 1/PK (MISCELLANEOUS) IMPLANT
CUFF TOURN SGL QUICK 18X4 (TOURNIQUET CUFF) IMPLANT
DRSG ADAPTIC 3X8 NADH LF (GAUZE/BANDAGES/DRESSINGS) IMPLANT
DRSG XEROFORM 1X8 (GAUZE/BANDAGES/DRESSINGS) IMPLANT
ELECTRODE REM PT RTRN 9FT ADLT (ELECTROSURGICAL) ×2 IMPLANT
GAUZE PAD ABD 8X10 STRL (GAUZE/BANDAGES/DRESSINGS) IMPLANT
GAUZE SPONGE 2X2 STRL 8-PLY (GAUZE/BANDAGES/DRESSINGS) IMPLANT
GAUZE SPONGE 4X4 12PLY STRL (GAUZE/BANDAGES/DRESSINGS) ×2 IMPLANT
GAUZE STRETCH 2X75IN STRL (MISCELLANEOUS) ×2 IMPLANT
GAUZE XEROFORM 1X8 LF (GAUZE/BANDAGES/DRESSINGS) ×2 IMPLANT
GLOVE BIO SURGEON STRL SZ7.5 (GLOVE) ×2 IMPLANT
GLOVE BIOGEL PI IND STRL 7.5 (GLOVE) ×2 IMPLANT
GOWN STRL REUS W/ TWL LRG LVL3 (GOWN DISPOSABLE) ×4 IMPLANT
KIT BASIN OR (CUSTOM PROCEDURE TRAY) ×2 IMPLANT
NDL HYPO 25X1 1.5 SAFETY (NEEDLE) ×2 IMPLANT
NEEDLE HYPO 25X1 1.5 SAFETY (NEEDLE) ×1 IMPLANT
PACK ORTHO EXTREMITY (CUSTOM PROCEDURE TRAY) ×2 IMPLANT
PADDING CAST ABS COTTON 4X4 ST (CAST SUPPLIES) ×4 IMPLANT
PENCIL BUTTON HOLSTER BLD 10FT (ELECTRODE) IMPLANT
SET HNDPC FAN SPRY TIP SCT (DISPOSABLE) IMPLANT
SOLN STERILE WATER BTL 1000 ML (IV SOLUTION) ×2 IMPLANT
SPIKE FLUID TRANSFER (MISCELLANEOUS) IMPLANT
SPONGE T-LAP 18X18 ~~LOC~~+RFID (SPONGE) IMPLANT
STAPLER SKIN PROX 35W (STAPLE) IMPLANT
STOCKINETTE 4X48 STRL (DRAPES) IMPLANT
SUT ETHILON 3 0 FSLX (SUTURE) IMPLANT
SUT PROLENE 2 0 SH DA (SUTURE) IMPLANT
SUT PROLENE 3 0 PS 2 (SUTURE) IMPLANT
SUT PROLENE 4 0 PS 2 18 (SUTURE) IMPLANT
SYR CONTROL 10ML LL (SYRINGE) ×2 IMPLANT
TUBE CONNECTING 12X1/4 (SUCTIONS) IMPLANT
UNDERPAD 30X36 HEAVY ABSORB (UNDERPADS AND DIAPERS) ×2 IMPLANT
YANKAUER SUCT BULB TIP NO VENT (SUCTIONS) IMPLANT

## 2024-01-31 NOTE — Progress Notes (Signed)
 Orthopedic Tech Progress Note Patient Details:  Brandy Hall [birth year] [id number]  Ortho Devices Type of Ortho Device: Postop shoe/boot Ortho Device/Splint Location: RLE/fitted and removed Ortho Device/Splint Interventions: Ordered, Application, Adjustment   Post Interventions Patient Tolerated: Well Instructions Provided: Adjustment of device, Care of device  Brandy Hall 01/31/2024, 9:52 AM

## 2024-01-31 NOTE — Progress Notes (Signed)
 CBG 70; per Dr. Jefm no need to start hypoglicemic protocol at this time. Will continue to monitor.

## 2024-01-31 NOTE — Anesthesia Postprocedure Evaluation (Signed)
 Anesthesia Post Note  Patient: Brandy Hall  Procedure(s) Performed: AMPUTATION, FOOT, TRANSMETATARSAL (Right: Toe)     Patient location during evaluation: PACU Anesthesia Type: Regional and MAC Level of consciousness: awake and alert Pain management: pain level controlled Vital Signs Assessment: post-procedure vital signs reviewed and stable Respiratory status: spontaneous breathing, nonlabored ventilation, respiratory function stable and patient connected to nasal cannula oxygen Cardiovascular status: stable and blood pressure returned to baseline Postop Assessment: no apparent nausea or vomiting Anesthetic complications: no   No notable events documented.  Last Vitals:  Vitals:   01/31/24 1033 01/31/24 1045  BP:  (!) 117/93  Pulse: 74 75  Resp: 13 13  Temp:  37 C  SpO2: 96% 92%    Last Pain:  Vitals:   01/31/24 1045  TempSrc:   PainSc: 0-No pain                 Garnette DELENA Gab

## 2024-01-31 NOTE — Inpatient Diabetes Management (Signed)
 Inpatient Diabetes Program Recommendations  AACE/ADA: New Consensus Statement on Inpatient Glycemic Control (2015)  Target Ranges:  Prepandial:   less than 140 mg/dL      Peak postprandial:   less than 180 mg/dL (1-2 hours)      Critically ill patients:  140 - 180 mg/dL   Lab Results  Component Value Date   GLUCAP 70 01/31/2024   HGBA1C 8.7 (H) 01/19/2024    Review of Glycemic Control  Latest Reference Range & Units 01/31/24 04:35  Glucose 70 - 99 mg/dL 56 (L)   Diabetes history: DM 2 Outpatient Diabetes medications: Novolog  0-10 units tid, Lantus  24 units Daily Current orders for Inpatient glycemic control:  Semglee  24 units Daily Novolog  0-9 units tid + hs  Inpatient Diabetes Program Recommendations:    Fasting glucose in the 70's, hypoglycemia  low in labs (56). On home dose of basal insulin   -  Consider reducing Semglee  to 20 units.  Thanks,  Clotilda Bull RN, MSN, BC-ADM Inpatient Diabetes Coordinator Team Pager [phone] (8a-5p)

## 2024-01-31 NOTE — Op Note (Addendum)
 Full Operative Report  Date of Operation: 9:48 AM, 01/31/2024   Patient: Brandy Hall - 63 y.o. female  Surgeon: Malvin Marsa FALCON, DPM   Assistant: None  Diagnosis: gangrene of right foot  Procedure:  1. Right foot transmetatarsal amputation 2. Application amniotic allograft right foot tma site    Anesthesia: Monitor Anesthesia Care  Jefm Garnette LABOR, MD  Anesthesiologist: Jefm Garnette LABOR, MD CRNA: Hedy Jarred, CRNA   Estimated Blood Loss: 50 mL  Hemostasis: 1) Anatomical dissection, mechanical compression, electrocautery 2) No tourniquet was used  Implants: Implant Name Type Inv. Item Serial No. Manufacturer Lot No. LRB No. Used Action  ALLOGRAFT AMNI BIOVANCE 5X5 1L - [id number] Graft ALLOGRAFT AMNI BIOVANCE 5X5 1L  [employer] [id number] Right 1 Implanted    Materials:  skin staples  Injectables: 1) Pre-operatively: Pre op block per anesthesia 2) Post-operatively: None   Specimens: - Pathology: Right forefoot for path - Microbiology: none   Antibiotics: IV antibiotics given per schedule on the floor  Drains: None  Complications: Patient tolerated the procedure well without complication.   Operative findings: As below in detailed report  Indications for Procedure: Brandy Hall presents to Malvin Marsa FALCON, DPM with a chief complaint of right forefoot gangrene of toes 1-4. Prior revascularization per vascular surgery. The patient has failed conservative treatments of various modalities. At this time the patient has elected to proceed with surgical correction. All alternatives, risks, and complications of the procedures were thoroughly explained to the patient. Patient exhibits appropriate understanding of all discussion points and informed consent was signed and obtained in the chart with no guarantees to surgical outcome given or implied.  Description of Procedure: Patient was brought to the operating room. Patient remained on  their hospital bed in the supine position. A surgical timeout was performed and all members of the operating room, the procedure, and the surgical site were identified. anesthesia occurred as per anesthesia record. Local anesthetic as previously described was then injected about the operative field in a local infiltrative block.  The operative lower extremity as noted above was then prepped and draped in the usual sterile manner. The following procedure then began.  Attention was directed to the RIGHT lower extremity. A fish-mouth type incision was made proximal to the web spaces and encompassed the entire forefoot. The full-thickness incision was made with a longer plantar flap to allow for wound closure. The incision was continued through the soft tissue down to the shafts of the metatarsal bones. A 15 blade was then used to free up the periosteum on all the metatarsal shafts. Using an oscillating saw, the metatarsals were cut in a dorsal distal to plantar proximal orientation. The first metatarsal was beveled so that the medial cortex was shorter than the lateral, and the fifth metatarsal was beveled so that the lateral cortex was shorter than the medial, thus less prominent. The amputation was done so that a metatarsal parabola was maintained.  The distal portion including all the digits were freed from the metatarsals and soft tissue attachments. The specimen was passed off the field and sent for gross pathology. All remaining non-viable and necrotic tissues were sharply resected and removed. Extensor and flexors tendons were grasped with a hemostat and cut proximally. Good bleeding was seen throughout the procedure and hemostaisis was performed as necessary with electrocautery.  The surgical site was then flushed with 1000ml of saline .   Then to promote healing at the amputation site, a 5x5 piece of biovance  amniotic allograft was cut in half and implanted in the amputation site to promote healing given  vascular compromise as evidenced by forefoot gangrene. The entire piece of graft was used none wasted. The plantar flap was brought in approximation with the dorsal flap and the sutures material previously described was used for closure. Care was taken not to place the flaps under tension in order not to jeopardize the vascular supply.  The surgical site was then dressed with xeroform 4x4, abd, kerlix and ace wrap. The patient tolerated both the procedure and anesthesia well with vital signs stable throughout. The patient was transferred in good condition and all vital signs stable  from the OR to recovery under the discretion of anesthesia.  Condition: Vital signs stable, neurovascular status unchanged from preoperative   Surgical plan:  Expect clean margin, great bleeding seen at amputation site. NWB RLE. Dressing change POD 2. PT OT. Rec 5 days augmentin  from surgery.   The patient will be NWB in a post op shoe to the operative limb until further instructed. The dressing is to remain clean, dry, and intact. Will continue to follow unless noted elsewhere.   Marsa Honour, DPM [facility]

## 2024-01-31 NOTE — Transfer of Care (Signed)
 Immediate Anesthesia Transfer of Care Note  Patient: Brandy Hall  Procedure(s) Performed: AMPUTATION, FOOT, TRANSMETATARSAL (Right: Toe)  Patient Location: PACU  Anesthesia Type:MAC and Regional  Level of Consciousness: drowsy  Airway & Oxygen Therapy: Patient Spontanous Breathing  Post-op Assessment: Report given to RN and Post -op Vital signs reviewed and stable  Post vital signs: Reviewed and stable  Last Vitals:  Vitals Value Taken Time  BP 129/77 01/31/24 09:45  Temp 37 C 01/31/24 09:34  Pulse 75 01/31/24 09:52  Resp 19 01/31/24 09:53  SpO2 85 % 01/31/24 09:52  Vitals shown include unfiled device data.  Last Pain:  Vitals:   01/31/24 0945  TempSrc:   PainSc: 0-No pain         Complications: No notable events documented.

## 2024-01-31 NOTE — Progress Notes (Addendum)
 PROGRESS NOTE    Brandy Hall  FMW:[id number] DOB: 14-Jun-1960 DOA: 01/29/2024 PCP: Marelyn Quill, MD   Brief Narrative:  [name] is a 63 year old female with history of CAD, PAD status post bilateral common femoral iliac stenting on 01/23/2024, COPD, hypertension, ongoing tobacco smoking, hyperlipidemia, who presents emergency department for chief concerns of right foot wound at the advice of podiatry.  Assessment & Plan:   Principal Problem:   Gangrene of toe of right foot (HCC) Active Problems:   Acute kidney injury superimposed on chronic kidney disease   Peripheral artery disease s/p right common Ilac stent placement 11/18   Essential hypertension   Insulin  dependent type 2 diabetes mellitus (HCC)   COPD (chronic obstructive pulmonary disease) (HCC)   Hyperlipidemia   History of CAD (coronary artery disease)   Continuous dependence on cigarette smoking   Sepsis secondary to gangrene of toe of right foot (HCC) Complicated by ongoing tobacco use Status post amputation 01/31/2024 with podiatry Patient okay to continue dual antiplatelet therapy (asa/plavix ) per podiatry Continue vancomycin  and Zosyn  per pharmacy Pain management per podiatry -currently doing quite well with nerve block   Peripheral artery disease s/p right common Ilac stent placement 11/18 On aspirin  and Plavix  - ok to continue peri-operatively per podiatry   Acute kidney injury superimposed on chronic kidney disease 11/25: Resolved   Continuous dependence on cigarette smoking Counseling at bedside regarding the benefits of tobacco cessation in setting of PAD and gangrene of the right foot.  I discussed that if patient continues to use cigarettes, this will delay/prevent wound healing which could cause worsening amputation including whole foot amputation, leg amputation etc.   Patient endorses understanding and compliance   History of CAD (coronary artery disease) Aspirin  81 mg, Plavix  75  mg daily   Hyperlipidemia Ezetimibe  10 mg   COPD (chronic obstructive pulmonary disease) (HCC) Patient is not in acute exacerbation Long-acting inhaler/maintenance inhaler were resumed DuoNebs every 6 hours as needed for wheezing and shortness of breath   Insulin  dependent type 2 diabetes mellitus (HCC) Insulin  long-acting, 24 units daily, insulin  SSI with at bedtime coverage was initiated on admission Goal inpatient blood glucose levels 140-180   Essential hypertension Amlodipine  5 mg daily, metoprolol  tartrate 25 mg p.o. twice daily  DVT prophylaxis: SCDs Start: 01/29/24 1925 Place TED hose Start: 01/29/24 1925   Code Status:   Code Status: Full Code  Family Communication: Daughter at bedside  Status is: Inpatient  Dispo: The patient is from: Home              Anticipated d/c is to: To be determined              Anticipated d/c date is: 48 to 72 hours              Patient currently not medically stable for discharge  Consultants:  Podiatry  Procedures:  Right transmetatarsal foot amputation 01/31/2024  Antimicrobials:  Perioperatively  Subjective: No acute issues or events overnight denies nausea vomiting diarrhea constipation headache fevers chills chest pain  Objective: Vitals:   01/31/24 0800 01/31/24 0805 01/31/24 0810 01/31/24 0815  BP: 132/74 124/82 138/76 (!) 137/98  Pulse: 76 77 75 78  Resp: 11 18 14 13   Temp:      TempSrc:      SpO2: 100% 100% 100% 99%    Intake/Output Summary (Last 24 hours) at 01/31/2024 0819 Last data filed at 01/31/2024 0329 Gross per 24 hour  Intake 1927.86 ml  Output --  Net 1927.86 ml   There were no vitals filed for this visit.  Examination:  General:  Pleasantly resting in bed, No acute distress. HEENT:  Normocephalic atraumatic.  Sclerae nonicteric, noninjected.  Extraocular movements intact bilaterally. Neck:  Without mass or deformity.  Trachea is midline. Lungs:  Clear to auscultate bilaterally without  rhonchi, wheeze, or rales. Heart:  Regular rate and rhythm.  Without murmurs, rubs, or gallops. Abdomen:  Soft, nontender, nondistended.  Without guarding or rebound. Extremities: Left foot bandage clean dry intact, in boot. Skin:  Warm and dry, no erythema.  Data Reviewed: I have personally reviewed following labs and imaging studies  CBC: Recent Labs  Lab 01/29/24 1524 01/30/24 0303 01/31/24 0435  WBC 8.4 7.6 7.4  NEUTROABS 5.8  --   --   HGB 11.7* 11.4* 10.7*  HCT 35.9* 35.3* 32.8*  MCV 102.9* 105.4* 102.5*  PLT 292 270 319   Basic Metabolic Panel: Recent Labs  Lab 01/29/24 1524 01/30/24 0303 01/31/24 0435  NA 132* 132* 136  K 3.9 4.1 3.3*  CL 94* 99 101  CO2 26 20* 25  GLUCOSE 143* 138* 56*  BUN 6* 6* 6*  CREATININE 1.22* 0.97 1.13*  CALCIUM 8.8* 8.7* 8.6*   GFR: Estimated Creatinine Clearance: 55.2 mL/min (A) (by C-G formula based on SCr of 1.13 mg/dL (H)). Liver Function Tests: Recent Labs  Lab 01/29/24 1524 01/30/24 0303  AST <10* 10*  ALT 7 7  ALKPHOS 68 65  BILITOT 0.5 0.9  PROT 7.1 6.6  ALBUMIN 2.7* 2.5*   CBG: Recent Labs  Lab 01/24/24 1130 01/29/24 2256 01/30/24 0744 01/30/24 1301 01/31/24 0608  GLUCAP 172* 139* 129* 107* 74   Sepsis Labs: Recent Labs  Lab 01/29/24 1532  LATICACIDVEN 0.7    Recent Results (from the past 240 hours)  Surgical pcr screen     Status: None   Collection Time: 01/23/24  5:05 AM   Specimen: Nasal Mucosa; Nasal Swab  Result Value Ref Range Status   MRSA, PCR NEGATIVE NEGATIVE Final   Staphylococcus aureus NEGATIVE NEGATIVE Final    Comment: (NOTE) The Xpert SA Assay (FDA approved for NASAL specimens in patients 44 years of age and older), is one component of a comprehensive surveillance program. It is not intended to diagnose infection nor to guide or monitor treatment. Performed at [facility], [address] [place], [place] [address]   Blood culture (routine x 2)     Status: None  (Preliminary result)   Collection Time: 01/29/24  5:38 PM   Specimen: BLOOD  Result Value Ref Range Status   Specimen Description BLOOD SITE NOT SPECIFIED  Final   Special Requests   Final    BOTTLES DRAWN AEROBIC AND ANAEROBIC Blood Culture results may not be optimal due to an inadequate volume of blood received in culture bottles   Culture   Final    NO GROWTH 2 DAYS Performed at [facility], [address] [place], [place] [address]    Report Status PENDING  Incomplete  Blood culture (routine x 2)     Status: None (Preliminary result)   Collection Time: 01/29/24  6:00 PM   Specimen: BLOOD RIGHT HAND  Result Value Ref Range Status   Specimen Description BLOOD RIGHT HAND  Final   Special Requests   Final    BOTTLES DRAWN AEROBIC AND ANAEROBIC Blood Culture results may not be optimal due to an inadequate volume of blood received  in culture bottles   Culture   Final    NO GROWTH 2 DAYS Performed at [facility], [address], [place], [place] [address]    Report Status PENDING  Incomplete         Radiology Studies: VAS US  ABI WITH/WO TBI Result Date: 01/30/2024  LOWER EXTREMITY DOPPLER STUDY Patient Name:  CHERISSE CARRELL  Date of Exam:   01/30/2024 Medical Rec #: [id number]         Accession #:    [id number] Date of Birth: May 11, 1960         Patient Gender: F Patient Age:   58 years Exam Location:  [facility] [place] Procedure:      VAS US  ABI WITH/WO TBI Referring Phys: SUBRINA SUNDIL --------------------------------------------------------------------------------  Indications: Peripheral artery disease. High Risk Factors: Hypertension, hyperlipidemia, Diabetes, current smoker.  Vascular Interventions: 01/23/2024 - INSERTION OF BILATERAL ILIAC STENT, ARTERY                         ULTRASOUND GUIDANCE, FOR VASCULAR ACCESS, BILATERAL                         FEMORAL ARTERIES. Comparison Study: 01/19/2024 - Right: Resting right ankle-brachial index                    indicates severe right lower                   extremity arterial disease.                    Left: Resting left ankle-brachial index indicates mild left                   lower                   extremity arterial disease. The left toe-brachial index is                   normal. Performing Technologist: Gerome Ny RVT  Examination Guidelines: A complete evaluation includes at minimum, Doppler waveform signals and systolic blood pressure reading at the level of bilateral brachial, anterior tibial, and posterior tibial arteries, when vessel segments are accessible. Bilateral testing is considered an integral part of a complete examination. Photoelectric Plethysmograph (PPG) waveforms and toe systolic pressure readings are included as required and additional duplex testing as needed. Limited examinations for reoccurring indications may be performed as noted.  ABI Findings: +---------+------------------+-----+-----------+--------+ Right    Rt Pressure (mmHg)IndexWaveform   Comment  +---------+------------------+-----+-----------+--------+ Brachial 131                    triphasic           +---------+------------------+-----+-----------+--------+ PTA      133               0.83 multiphasic         +---------+------------------+-----+-----------+--------+ DP       154               0.96 multiphasic         +---------+------------------+-----+-----------+--------+ Great Toe                       Absent              +---------+------------------+-----+-----------+--------+ +---------+------------------+-----+-----------+-------+ Left     Lt Pressure (mmHg)IndexWaveform  Comment +---------+------------------+-----+-----------+-------+ Brachial 161                    triphasic          +---------+------------------+-----+-----------+-------+ PTA      138               0.86 multiphasic        +---------+------------------+-----+-----------+-------+ DP       129                0.80 multiphasic        +---------+------------------+-----+-----------+-------+ Great Toe110               0.68                    +---------+------------------+-----+-----------+-------+ +-------+-----------+-----------+------------+------------+ ABI/TBIToday's ABIToday's TBIPrevious ABIPrevious TBI +-------+-----------+-----------+------------+------------+ Right  0.96       Absent                              +-------+-----------+-----------+------------+------------+ Left   0.86       0.68                                +-------+-----------+-----------+------------+------------+  Summary: Right: Resting right ankle-brachial index is within normal range.  Unable to obtain TBI due to absent waveforms. Left: Resting left ankle-brachial index indicates mild left lower extremity arterial disease. The left toe-brachial index is abnormal.  *See table(s) above for measurements and observations.  Electronically signed by Penne Colorado MD on 01/30/2024 at 3:59:48 PM.    Final    DG Foot Complete Right Result Date: 01/29/2024 EXAM: 3 OR MORE VIEW(S) XRAY OF THE _LATERALITY_ FOOT 01/29/2024 05:31:00 PM COMPARISON: None available. CLINICAL HISTORY: Fever, concern for gangrene in the foot. Does have a dopplerable pulse after recent arterial stenting. FINDINGS: BONES AND JOINTS: No bony destructive or erosive findings characteristic of active osteomyelitis. No acute fracture. No focal osseous lesion. No joint dislocation. SOFT TISSUES: Substantial soft tissue swelling dorsally along the ankle and foot. Prominent soft tissue volume loss distally in the second toe, cannot exclude ulceration, correlate with visual inspection. IMPRESSION: 1. Substantial soft tissue swelling dorsally along the ankle and foot. 2. Soft tissue volume loss distally in the second toe with possible ulceration; correlate with point tenderness and visual inspection. 3. No bony destructive or erosive findings  characteristic of active osteomyelitis. Electronically signed by: Ryan Salvage MD 01/29/2024 06:26 PM EST RP Workstation: [id number]   DG Foot Complete Right Result Date: 01/29/2024 Please see detailed radiograph report in office note.       Scheduled Meds:  [MAR Hold] amLODipine   5 mg Oral Daily   [MAR Hold] aspirin   81 mg Oral Daily   [MAR Hold] budesonide -glycopyrrolate -formoterol   2 puff Inhalation BID   [MAR Hold] clopidogrel   75 mg Oral Daily   [MAR Hold] docusate sodium   100 mg Oral BID   [MAR Hold] ezetimibe   10 mg Oral Daily   [MAR Hold] insulin  aspart  0-5 Units Subcutaneous QHS   [MAR Hold] insulin  aspart  0-9 Units Subcutaneous TID WC   [MAR Hold] insulin  glargine-yfgn  24 Units Subcutaneous Daily   [MAR Hold] metoprolol  tartrate  25 mg Oral BID   [MAR Hold] montelukast   10 mg Oral QHS   [MAR Hold] multivitamin with minerals  1 tablet Oral Daily   [MAR Hold] pantoprazole   40  mg Oral BID AC   [MAR Hold] pregabalin   50 mg Oral TID   [MAR Hold] ranolazine   500 mg Oral BID   [MAR Hold] sodium chloride  flush  3 mL Intravenous Q12H   [MAR Hold] sodium chloride  flush  3 mL Intravenous Q12H   Continuous Infusions:  lactated ringers  10 mL/hr at 01/31/24 0810   [MAR Hold] piperacillin -tazobactam 3.375 g (01/31/24 0255)   [MAR Hold] vancomycin  1,500 mg (01/30/24 1820)     LOS: 2 days   Time spent:  Elsie JAYSON Montclair, DO [facility]  If 7PM-7AM, please contact night-coverage [email or web address]  01/31/2024, 8:19 AM

## 2024-01-31 NOTE — Progress Notes (Signed)
 History and Physical Interval Note:  01/31/2024 7:50 AM  Brandy Hall  has presented today for surgery, with the diagnosis of gangrene of digits 1-4 on right foot.  The various methods of treatment have been discussed with the patient and family. After consideration of risks, benefits and other options for treatment, the patient has consented to   Procedure(s) with comments: AMPUTATION, FOOT, TRANSMETATARSAL (Right) - pre op block per anesthesia right foot tma as a surgical intervention.  The patient's history has been reviewed, patient examined, no change in status, stable for surgery.  I have reviewed the patient's chart and labs.  Questions were answered to the patient's satisfaction.     Marsa FALCON Shakinah Navis

## 2024-01-31 NOTE — Anesthesia Procedure Notes (Signed)
 Anesthesia Regional Block: Popliteal block   Pre-Anesthetic Checklist: , timeout performed,  Correct Patient, Correct Site, Correct Laterality,  Correct Procedure, Correct Position, site marked,  Risks and benefits discussed,  Pre-op evaluation,  At surgeon's request and post-op pain management  Laterality: Lower and Right  Prep: Maximum Sterile Barrier Precautions used, chloraprep       Needles:  Injection technique: Single-shot  Needle Type: Echogenic Needle     Needle Length: 9cm  Needle Gauge: 21     Additional Needles:   Procedures:,,,, ultrasound used (permanent image in chart),,    Narrative:  Start time: 01/31/2024 7:59 AM End time: 01/31/2024 8:04 AM Injection made incrementally with aspirations every 5 mL.  Performed by: Personally  Anesthesiologist: Jefm Garnette LABOR, MD  Additional Notes: Block assessed. Patient tolerated procedure well.

## 2024-02-01 DIAGNOSIS — I96 Gangrene, not elsewhere classified: Secondary | ICD-10-CM | POA: Diagnosis not present

## 2024-02-01 LAB — GLUCOSE, CAPILLARY
Glucose-Capillary: 102 mg/dL — ABNORMAL HIGH (ref 70–99)
Glucose-Capillary: 63 mg/dL — ABNORMAL LOW (ref 70–99)
Glucose-Capillary: 89 mg/dL (ref 70–99)
Glucose-Capillary: 78 mg/dL (ref 70–99)
Glucose-Capillary: 62 mg/dL — ABNORMAL LOW (ref 70–99)
Glucose-Capillary: 77 mg/dL (ref 70–99)
Glucose-Capillary: 140 mg/dL — ABNORMAL HIGH (ref 70–99)
Glucose-Capillary: 64 mg/dL — ABNORMAL LOW (ref 70–99)

## 2024-02-01 LAB — CBC
HCT: 31.9 % — ABNORMAL LOW (ref 36.0–46.0)
Hemoglobin: 10.5 g/dL — ABNORMAL LOW (ref 12.0–15.0)
MCH: 33.7 pg (ref 26.0–34.0)
MCHC: 32.9 g/dL (ref 30.0–36.0)
MCV: 102.2 fL — ABNORMAL HIGH (ref 80.0–100.0)
Platelets: 318 K/uL (ref 150–400)
RBC: 3.12 MIL/uL — ABNORMAL LOW (ref 3.87–5.11)
RDW: 12.8 % (ref 11.5–15.5)
WBC: 7.3 K/uL (ref 4.0–10.5)
nRBC: 0 % (ref 0.0–0.2)

## 2024-02-01 LAB — BASIC METABOLIC PANEL WITH GFR
Anion gap: 8 (ref 5–15)
BUN: 5 mg/dL — ABNORMAL LOW (ref 8–23)
CO2: 24 mmol/L (ref 22–32)
Calcium: 8.8 mg/dL — ABNORMAL LOW (ref 8.9–10.3)
Chloride: 100 mmol/L (ref 98–111)
Creatinine, Ser: 0.99 mg/dL (ref 0.44–1.00)
GFR, Estimated: >60 mL/min (ref >60)
Glucose, Bld: 95 mg/dL (ref 70–99)
Potassium: 4 mmol/L (ref 3.5–5.1)
Sodium: 132 mmol/L — ABNORMAL LOW (ref 135–145)

## 2024-02-01 NOTE — Evaluation (Signed)
 Physical Therapy Evaluation Patient Details Name: Brandy Hall MRN: [id number] DOB: February 23, 1961 Today's Date: 02/01/2024  History of Present Illness  63 y.o. female presents [facility] hospital on 01/29/2024 with R foot wound. Pt underwent R foot TMA on 11/26. PMH includes CAD, PAD, COPD, HTN, HLD.  Clinical Impression  Pt presents to PT with deficits in cognition, functional mobility, balance. Pt has significant cognitive deficits at this time, initially not recalling her surgery from yesterday. Pt is unable to consistently follow cues to maintain NWB with transfer attempts, requiring physical assistance of this PT to maintain RLE elevation off the floor. Pt also requires significant physical assistance for all standing attempts. Pt is at a high risk for falls or injury to R foot due to AMS and impaired mobility. Patient will benefit from continued inpatient follow up therapy, <3 hours/day.      If plan is discharge home, recommend the following: Two people to help with walking and/or transfers;Two people to help with bathing/dressing/bathroom;Assistance with cooking/housework;Direct supervision/assist for medications management;Direct supervision/assist for financial management;Assist for transportation;Help with stairs or ramp for entrance;Supervision due to cognitive status   Can travel by private vehicle   No    Equipment Recommendations Wheelchair (measurements PT);Wheelchair cushion (measurements PT);Hospital bed;Hoyer lift  Recommendations for Other Services       Functional Status Assessment Patient has had a recent decline in their functional status and demonstrates the ability to make significant improvements in function in a reasonable and predictable amount of time.     Precautions / Restrictions Precautions Precautions: Fall Recall of Precautions/Restrictions: Impaired Precaution/Restrictions Comments: no recall of WB restrictions Required Braces or Orthoses: Other  Brace Other Brace: post-op shoe Restrictions Weight Bearing Restrictions Per Provider Order: Yes RLE Weight Bearing Per Provider Order: Non weight bearing      Mobility  Bed Mobility Overal bed mobility: Needs Assistance Bed Mobility: Supine to Sit, Sit to Supine     Supine to sit: Min assist Sit to supine: Contact guard assist        Transfers Overall transfer level: Needs assistance Equipment used: Rolling walker (2 wheels), 1 person hand held assist Transfers: Sit to/from Stand Sit to Stand: Max assist, From elevated surface           General transfer comment: pt is unable to stand with use of RW while maintaining NWB through R foot. Pt requires maxA to stand with L knee block and BUE support but is unable to maintain NWB to R foot    Ambulation/Gait                  Stairs            Wheelchair Mobility     Tilt Bed    Modified Rankin (Stroke Patients Only)       Balance Overall balance assessment: Needs assistance Sitting-balance support: No upper extremity supported, Feet supported Sitting balance-Leahy Scale: Fair     Standing balance support: Bilateral upper extremity supported, Reliant on assistive device for balance Standing balance-Leahy Scale: Zero Standing balance comment: unable to maintain WB restrictions                             Pertinent Vitals/Pain Pain Assessment Pain Assessment: 0-10 Pain Score: 10-Worst pain ever Pain Location: R foot Pain Descriptors / Indicators: Aching Pain Intervention(s): Limited activity within patient's tolerance    Home Living Family/patient expects to be discharged to:: Private residence  Living Arrangements: Alone Available Help at Discharge:  (none identified) Type of Home: House Home Access: Stairs to enter Entrance Stairs-Rails: Can reach both Entrance Stairs-Number of Steps: 6   Home Layout: One level Home Equipment: Agricultural Consultant (2 wheels);Cane - single  point;BSC/3in1;Shower seat Additional Comments: unsure if pt has provided reliable reports due to AMS    Prior Function Prior Level of Function : Independent/Modified Independent             Mobility Comments: pt reports ambulating with RW or SPC for balance ADLs Comments: pt reports independence with ADLs     Extremity/Trunk Assessment   Upper Extremity Assessment Upper Extremity Assessment: Overall WFL for tasks assessed    Lower Extremity Assessment Lower Extremity Assessment: Generalized weakness    Cervical / Trunk Assessment Cervical / Trunk Assessment: Normal  Communication   Communication Communication: Impaired Factors Affecting Communication: Difficulty expressing self    Cognition Arousal: Alert Behavior During Therapy: Impulsive   PT - Cognitive impairments: Awareness, Memory, Orientation, Initiation, Attention, Sequencing, Problem solving, Safety/Judgement   Orientation impairments: Situation, Time                   PT - Cognition Comments: pt initially is unable to recall having surgery. Pt frequently distracted by lines or blankets during session and is unable to maintain WB restrictions despite verbal and tactile cues from PT as well as demonstration Following commands: Impaired Following commands impaired: Follows one step commands inconsistently, Follows multi-step commands inconsistently     Cueing Cueing Techniques: Verbal cues, Tactile cues, Visual cues     General Comments General comments (skin integrity, edema, etc.): VSS on RA    Exercises     Assessment/Plan    PT Assessment Patient needs continued PT services  PT Problem List Decreased strength;Decreased activity tolerance;Decreased balance;Decreased mobility;Decreased cognition;Decreased knowledge of use of DME;Decreased safety awareness;Decreased knowledge of precautions;Pain       PT Treatment Interventions Gait training;DME instruction;Functional mobility  training;Therapeutic activities;Therapeutic exercise;Balance training;Stair training;Neuromuscular re-education;Cognitive remediation;Patient/family education;Wheelchair mobility training    PT Goals (Current goals can be found in the Care Plan section)  Acute Rehab PT Goals Patient Stated Goal: to improve mobility quality, let R foot heal PT Goal Formulation: With patient Time For Goal Achievement: 02/15/24 Potential to Achieve Goals: Poor Additional Goals Additional Goal #1: Pt will mobilize in a manual wheelchair for >50' at a modI level to demonstrate the ability to safely mobilize in the home while maintaining NWB through R foot    Frequency Min 2X/week     Co-evaluation               AM-PAC PT 6 Clicks Mobility  Outcome Measure Help needed turning from your back to your side while in a flat bed without using bedrails?: A Little Help needed moving from lying on your back to sitting on the side of a flat bed without using bedrails?: A Little Help needed moving to and from a bed to a chair (including a wheelchair)?: Total Help needed standing up from a chair using your arms (e.g., wheelchair or bedside chair)?: Total Help needed to walk in hospital room?: Total Help needed climbing 3-5 steps with a railing? : Total 6 Click Score: 10    End of Session Equipment Utilized During Treatment: Gait belt Activity Tolerance: Patient tolerated treatment well Patient left: in bed;with call bell/phone within reach;with bed alarm set Nurse Communication: Mobility status;Need for lift equipment PT Visit Diagnosis: Other abnormalities of gait  and mobility (R26.89);Muscle weakness (generalized) (M62.81);Pain Pain - Right/Left: Right Pain - part of body: Ankle and joints of foot    Time: 0950-1015 PT Time Calculation (min) (ACUTE ONLY): 25 min   Charges:   PT Evaluation $PT Eval Low Complexity: 1 Low   PT General Charges $$ ACUTE PT VISIT: 1 Visit         Bernardino JINNY Ruth, PT,  DPT Acute Rehabilitation Office [phone]   Bernardino JINNY Ruth 02/01/2024, 10:37 AM

## 2024-02-01 NOTE — Progress Notes (Signed)
 PROGRESS NOTE    Brandy Hall  FMW:[id number] DOB: [birth year] DOA: 01/29/2024 PCP: Brandy Quill, MD   Brief Narrative:  [name] is a 63 year old female with history of CAD, PAD status post bilateral common femoral iliac stenting on 01/23/2024, COPD, hypertension, ongoing tobacco smoking, hyperlipidemia, who presents emergency department for chief concerns of right foot wound at the advice of podiatry.  Assessment & Plan:   Principal Problem:   Gangrene of toe of right foot (HCC) Active Problems:   Acute kidney injury superimposed on chronic kidney disease   Peripheral artery disease s/p right common Ilac stent placement 11/18   Essential hypertension   Insulin  dependent type 2 diabetes mellitus (HCC)   COPD (chronic obstructive pulmonary disease) (HCC)   Hyperlipidemia   History of CAD (coronary artery disease)   Continuous dependence on cigarette smoking   Sepsis secondary to gangrene of toe of right foot (HCC) Complicated by ongoing tobacco use Status post amputation 01/31/2024 with podiatry Patient okay to continue dual antiplatelet therapy (asa/plavix ) per podiatry Transition from IV antibiotics to p.o. Augmentin  per podiatry for 5 days Pain management per podiatry -currently doing quite well with nerve block   Hospital delirium/sundowning  Rule out polypharmacy - Patient somewhat confused this morning, able to recognize family but unable to recall other things that family feels should be easy for her to recall - Polypharmacy is certainly a risk factor given recent anesthesia and narcotics - Continue sleep wake cycle, supportive care, request family to be available at bedside if at all possible to help patient orient  Peripheral artery disease s/p right common Ilac stent placement 11/18 On aspirin  and Plavix  - ok to continue peri-operatively per podiatry   Acute kidney injury superimposed on chronic kidney disease 11/25: Resolved   Continuous  dependence on cigarette smoking Counseling at bedside regarding the benefits of tobacco cessation in setting of PAD and gangrene of the right foot.  I discussed that if patient continues to use cigarettes, this will delay/prevent wound healing which could cause worsening amputation including whole foot amputation, leg amputation etc.   Patient endorses understanding and compliance   History of CAD (coronary artery disease) Aspirin  81 mg, Plavix  75 mg daily   Hyperlipidemia Ezetimibe  10 mg   COPD (chronic obstructive pulmonary disease) (HCC) Patient is not in acute exacerbation Long-acting inhaler/maintenance inhaler were resumed DuoNebs every 6 hours as needed for wheezing and shortness of breath   Insulin  dependent type 2 diabetes mellitus (HCC) Insulin  long-acting, 24 units daily, insulin  SSI with at bedtime coverage was initiated on admission Goal inpatient blood glucose levels 140-180   Essential hypertension Amlodipine  5 mg daily, metoprolol  tartrate 25 mg p.o. twice daily  DVT prophylaxis: SCDs Start: 01/29/24 1925 Place TED hose Start: 01/29/24 1925   Code Status:   Code Status: Full Code  Family Communication: Daughter at bedside  Status is: Inpatient  Dispo: The patient is from: Home              Anticipated d/c is to: To be determined              Anticipated d/c date is: 48 to 72 hours              Patient currently not medically stable for discharge  Consultants:  Podiatry  Procedures:  Right transmetatarsal foot amputation 01/31/2024  Antimicrobials:  Perioperatively  Subjective: Notable hyperglycemia overnight resolved with supportive care, otherwise denies nausea vomit diarrhea constipation confusion or chest pain.  Objective:  Vitals:   01/31/24 2004 01/31/24 2315 02/01/24 0500 02/01/24 0549  BP: 112/84 126/74 139/78 139/78  Pulse: 84 78 80 80  Resp: 18 13  18   Temp: 99.1 F (37.3 C) (!) 97.5 F (36.4 C) 97.6 F (36.4 C)   TempSrc:       SpO2: 97% 96% 95% 95%    Intake/Output Summary (Last 24 hours) at 02/01/2024 0825 Last data filed at 01/31/2024 1700 Gross per 24 hour  Intake 1000.83 ml  Output 30 ml  Net 970.83 ml   There were no vitals filed for this visit.  Examination:  General:  Pleasantly resting in bed, No acute distress. HEENT:  Normocephalic atraumatic.  Sclerae nonicteric, noninjected.  Extraocular movements intact bilaterally. Neck:  Without mass or deformity.  Trachea is midline. Lungs:  Clear to auscultate bilaterally without rhonchi, wheeze, or rales. Heart:  Regular rate and rhythm.  Without murmurs, rubs, or gallops. Abdomen:  Soft, nontender, nondistended.  Without guarding or rebound. Extremities: Left foot bandage clean dry intact, in boot. Skin:  Warm and dry, no erythema.  Data Reviewed: I have personally reviewed following labs and imaging studies  CBC: Recent Labs  Lab 01/29/24 1524 01/30/24 0303 01/31/24 0435 02/01/24 0406  WBC 8.4 7.6 7.4 7.3  NEUTROABS 5.8  --   --   --   HGB 11.7* 11.4* 10.7* 10.5*  HCT 35.9* 35.3* 32.8* 31.9*  MCV 102.9* 105.4* 102.5* 102.2*  PLT 292 270 319 318   Basic Metabolic Panel: Recent Labs  Lab 01/29/24 1524 01/30/24 0303 01/31/24 0435 02/01/24 0406  NA 132* 132* 136 132*  K 3.9 4.1 3.3* 4.0  CL 94* 99 101 100  CO2 26 20* 25 24  GLUCOSE 143* 138* 56* 95  BUN 6* 6* 6* 5*  CREATININE 1.22* 0.97 1.13* 0.99  CALCIUM 8.8* 8.7* 8.6* 8.8*   GFR: Estimated Creatinine Clearance: 63 mL/min (by C-G formula based on SCr of 0.99 mg/dL). Liver Function Tests: Recent Labs  Lab 01/29/24 1524 01/30/24 0303  AST <10* 10*  ALT 7 7  ALKPHOS 68 65  BILITOT 0.5 0.9  PROT 7.1 6.6  ALBUMIN 2.7* 2.5*   CBG: Recent Labs  Lab 01/31/24 1033 01/31/24 1247 01/31/24 1544 01/31/24 2116 02/01/24 0613  GLUCAP 69* 74 134* 94 102*   Sepsis Labs: Recent Labs  Lab 01/29/24 1532  LATICACIDVEN 0.7    Recent Results (from the past 240 hours)   Surgical pcr screen     Status: None   Collection Time: 01/23/24  5:05 AM   Specimen: Nasal Mucosa; Nasal Swab  Result Value Ref Range Status   MRSA, PCR NEGATIVE NEGATIVE Final   Staphylococcus aureus NEGATIVE NEGATIVE Final    Comment: (NOTE) The Xpert SA Assay (FDA approved for NASAL specimens in patients 44 years of age and older), is one component of a comprehensive surveillance program. It is not intended to diagnose infection nor to guide or monitor treatment. Performed at [facility], [address] [place], [place] [address]   Blood culture (routine x 2)     Status: None (Preliminary result)   Collection Time: 01/29/24  5:38 PM   Specimen: BLOOD  Result Value Ref Range Status   Specimen Description BLOOD SITE NOT SPECIFIED  Final   Special Requests   Final    BOTTLES DRAWN AEROBIC AND ANAEROBIC Blood Culture results may not be optimal due to an inadequate volume of blood received in culture bottles   Culture  Final    NO GROWTH 2 DAYS Performed at [facility], [address], [place], [place] [address]    Report Status PENDING  Incomplete  Blood culture (routine x 2)     Status: None (Preliminary result)   Collection Time: 01/29/24  6:00 PM   Specimen: BLOOD RIGHT HAND  Result Value Ref Range Status   Specimen Description BLOOD RIGHT HAND  Final   Special Requests   Final    BOTTLES DRAWN AEROBIC AND ANAEROBIC Blood Culture results may not be optimal due to an inadequate volume of blood received in culture bottles   Culture   Final    NO GROWTH 2 DAYS Performed at [facility], [address], [place], [place] [address]    Report Status PENDING  Incomplete         Radiology Studies: DG Foot 2 Views Right Result Date: 01/31/2024 EXAM: 1 OR 2 VIEW(S) XRAY OF THE RIGHT FOOT 01/31/2024 09:50:19 AM COMPARISON: 01/29/2024. CLINICAL HISTORY: 747648 Post-operative state 252351 Post-operative state FINDINGS: BONES AND JOINTS: Interval changes of  transmetatarsal amputation of all 5 right toes. No complicating feature. No acute fracture. SOFT TISSUES: The soft tissues are unremarkable. IMPRESSION: 1. Interval transmetatarsal amputation of all five right toes without complicating feature. Electronically signed by: Franky Crease MD 01/31/2024 08:02 PM EST RP Workstation: [id number]   VAS US  ABI WITH/WO TBI Result Date: 01/30/2024  LOWER EXTREMITY DOPPLER STUDY Patient Name:  TRENDA CORLISS  Date of Exam:   01/30/2024 Medical Rec #: [id number]         Accession #:    [id number] Date of Birth: [birth year]         Patient Gender: F Patient Age:   72 years Exam Location:  [facility] Procedure:      VAS US  ABI WITH/WO TBI Referring Phys: SUBRINA SUNDIL --------------------------------------------------------------------------------  Indications: Peripheral artery disease. High Risk Factors: Hypertension, hyperlipidemia, Diabetes, current smoker.  Vascular Interventions: 01/23/2024 - INSERTION OF BILATERAL ILIAC STENT, ARTERY                         ULTRASOUND GUIDANCE, FOR VASCULAR ACCESS, BILATERAL                         FEMORAL ARTERIES. Comparison Study: 01/19/2024 - Right: Resting right ankle-brachial index                   indicates severe right lower                   extremity arterial disease.                    Left: Resting left ankle-brachial index indicates mild left                   lower                   extremity arterial disease. The left toe-brachial index is                   normal. Performing Technologist: Gerome Ny RVT  Examination Guidelines: A complete evaluation includes at minimum, Doppler waveform signals and systolic blood pressure reading at the level of bilateral brachial, anterior tibial, and posterior tibial arteries, when vessel segments are accessible. Bilateral testing is considered an integral part of a complete examination. Photoelectric Plethysmograph (PPG) waveforms  and toe systolic pressure readings are  included as required and additional duplex testing as needed. Limited examinations for reoccurring indications may be performed as noted.  ABI Findings: +---------+------------------+-----+-----------+--------+ Right    Rt Pressure (mmHg)IndexWaveform   Comment  +---------+------------------+-----+-----------+--------+ Brachial 131                    triphasic           +---------+------------------+-----+-----------+--------+ PTA      133               0.83 multiphasic         +---------+------------------+-----+-----------+--------+ DP       154               0.96 multiphasic         +---------+------------------+-----+-----------+--------+ Great Toe                       Absent              +---------+------------------+-----+-----------+--------+ +---------+------------------+-----+-----------+-------+ Left     Lt Pressure (mmHg)IndexWaveform   Comment +---------+------------------+-----+-----------+-------+ Brachial 161                    triphasic          +---------+------------------+-----+-----------+-------+ PTA      138               0.86 multiphasic        +---------+------------------+-----+-----------+-------+ DP       129               0.80 multiphasic        +---------+------------------+-----+-----------+-------+ Great Toe110               0.68                    +---------+------------------+-----+-----------+-------+ +-------+-----------+-----------+------------+------------+ ABI/TBIToday's ABIToday's TBIPrevious ABIPrevious TBI +-------+-----------+-----------+------------+------------+ Right  0.96       Absent                              +-------+-----------+-----------+------------+------------+ Left   0.86       0.68                                +-------+-----------+-----------+------------+------------+  Summary: Right: Resting right ankle-brachial index is within normal range.  Unable to obtain TBI due to absent  waveforms. Left: Resting left ankle-brachial index indicates mild left lower extremity arterial disease. The left toe-brachial index is abnormal.  *See table(s) above for measurements and observations.  Electronically signed by Penne Colorado MD on 01/30/2024 at 3:59:48 PM.    Final         Scheduled Meds:  amLODipine   5 mg Oral Daily   amoxicillin -clavulanate  1 tablet Oral Q12H   aspirin   81 mg Oral Daily   budesonide -glycopyrrolate -formoterol   2 puff Inhalation BID   clopidogrel   75 mg Oral Daily   docusate sodium   100 mg Oral BID   ezetimibe   10 mg Oral Daily   insulin  aspart  0-5 Units Subcutaneous QHS   insulin  aspart  0-9 Units Subcutaneous TID WC   insulin  glargine-yfgn  24 Units Subcutaneous Daily   metoprolol  tartrate  25 mg Oral BID   montelukast   10 mg Oral QHS   multivitamin with minerals  1 tablet Oral Daily  pantoprazole   40 mg Oral BID AC   pregabalin   50 mg Oral TID   ranolazine   500 mg Oral BID   sodium chloride  flush  3 mL Intravenous Q12H   sodium chloride  flush  3 mL Intravenous Q12H   Continuous Infusions:     LOS: 3 days   Time spent:  Elsie JAYSON Montclair, DO [facility]  If 7PM-7AM, please contact night-coverage [email or web address]  02/01/2024, 8:25 AM

## 2024-02-01 NOTE — Progress Notes (Signed)
 Hypoglycemic Event  CBG: 64  Treatment: 8 oz juice/soda  Symptoms: None  Follow-up CBG: Time:2145 CBG Result:140  Possible Reasons for Event: Inadequate meal intake  Comments/MD notified:Yes    Zachary MATSU Adlene Adduci RN

## 2024-02-02 ENCOUNTER — Encounter (HOSPITAL_COMMUNITY): Payer: Self-pay | Admitting: Podiatry

## 2024-02-02 DIAGNOSIS — I96 Gangrene, not elsewhere classified: Secondary | ICD-10-CM | POA: Diagnosis not present

## 2024-02-02 LAB — CBC
HCT: 33.4 % — ABNORMAL LOW (ref 36.0–46.0)
Hemoglobin: 11.4 g/dL — ABNORMAL LOW (ref 12.0–15.0)
MCH: 33.7 pg (ref 26.0–34.0)
MCHC: 34.1 g/dL (ref 30.0–36.0)
MCV: 98.8 fL (ref 80.0–100.0)
Platelets: 338 K/uL (ref 150–400)
RBC: 3.38 MIL/uL — ABNORMAL LOW (ref 3.87–5.11)
RDW: 12.8 % (ref 11.5–15.5)
WBC: 10.6 K/uL — ABNORMAL HIGH (ref 4.0–10.5)
nRBC: 0 % (ref 0.0–0.2)

## 2024-02-02 LAB — GLUCOSE, CAPILLARY
Glucose-Capillary: 69 mg/dL — ABNORMAL LOW (ref 70–99)
Glucose-Capillary: 121 mg/dL — ABNORMAL HIGH (ref 70–99)
Glucose-Capillary: 127 mg/dL — ABNORMAL HIGH (ref 70–99)
Glucose-Capillary: 143 mg/dL — ABNORMAL HIGH (ref 70–99)
Glucose-Capillary: 149 mg/dL — ABNORMAL HIGH (ref 70–99)
Glucose-Capillary: 100 mg/dL — ABNORMAL HIGH (ref 70–99)

## 2024-02-02 LAB — BASIC METABOLIC PANEL WITH GFR
Anion gap: 9 (ref 5–15)
BUN: 6 mg/dL — ABNORMAL LOW (ref 8–23)
CO2: 26 mmol/L (ref 22–32)
Calcium: 9 mg/dL (ref 8.9–10.3)
Chloride: 95 mmol/L — ABNORMAL LOW (ref 98–111)
Creatinine, Ser: 1.04 mg/dL — ABNORMAL HIGH (ref 0.44–1.00)
GFR, Estimated: 60 mL/min — ABNORMAL LOW (ref >60)
Glucose, Bld: 57 mg/dL — ABNORMAL LOW (ref 70–99)
Potassium: 3.9 mmol/L (ref 3.5–5.1)
Sodium: 130 mmol/L — ABNORMAL LOW (ref 135–145)

## 2024-02-02 LAB — SURGICAL PATHOLOGY

## 2024-02-02 MED ORDER — INSULIN GLARGINE-YFGN 100 UNIT/ML ~~LOC~~ SOLN
12.0000 [IU] | Freq: Every day | SUBCUTANEOUS | Status: DC
  Administered 2024-02-03 – 2024-02-06 (×3): 12 [IU] via SUBCUTANEOUS
  Filled 2024-02-02 (×5): qty 0.12

## 2024-02-02 NOTE — Care Management Important Message (Signed)
 Important Message  Patient Details  Name: Brandy Hall MRN: [id number] Date of Birth: 1961-01-09   Important Message Given:  Yes - Medicare IM     Jennie Laneta Dragon 02/02/2024, 12:44 PM

## 2024-02-02 NOTE — Evaluation (Signed)
 Occupational Therapy Evaluation Patient Details Name: Brandy Hall MRN: [id number] DOB: [birth year] Today's Date: 02/02/2024   History of Present Illness   63 y.o. female presents to [facility] hospital on 01/29/2024 with R foot wound. Pt underwent R foot TMA on 11/26. PMH includes CAD, PAD, COPD, HTN, HLD.     Clinical Impressions PTA Patient independent and driving. Admitted for above and presents with problem list below.  Pt needing min assist for bed mobility, min to total assist for ADLs at EOB, and unable to transition into standing or lateral scoot to [facility] due to inability to maintain NWB to R LE.  She is able to report having surgery on R LE, but cannot immediately recall weightbearing precautions; and is attempting to bear weight with any transfer.  She requires increased time to recall year after given choices, demonstrating poor attention, problem solving and awareness to deficits.  Even requires cueing to initiate self feeding once upright in bed at end of session, after she reports being hungry. Would need +2 assist for transfers at this time and would be unsafe to dc home.  Based on performance today, believe patient will best benefit from continued OT services acutely and after dc at inpatient setting with <3hrs/day to optimize independence, safety with ADLs and mobility.     If plan is discharge home, recommend the following:   Two people to help with walking and/or transfers;Two people to help with bathing/dressing/bathroom;Direct supervision/assist for medications management;Direct supervision/assist for financial management;Assist for transportation;Help with stairs or ramp for entrance     Functional Status Assessment   Patient has had a recent decline in their functional status and demonstrates the ability to make significant improvements in function in a reasonable and predictable amount of time.     Equipment Recommendations   BSC/3in1 (drop arm)     Recommendations  for Other Services   Speech consult (cognition)     Precautions/Restrictions   Precautions Precautions: Fall Recall of Precautions/Restrictions: Impaired Precaution/Restrictions Comments: no recall of WB restrictions Required Braces or Orthoses: Other Brace Other Brace: post-op shoe Restrictions Weight Bearing Restrictions Per Provider Order: Yes RLE Weight Bearing Per Provider Order: Non weight bearing     Mobility Bed Mobility Overal bed mobility: Needs Assistance Bed Mobility: Supine to Sit, Sit to Supine     Supine to sit: Min assist Sit to supine: Min assist   General bed mobility comments: increased time and cueing, min assist to scoot foward    Transfers Overall transfer level: Needs assistance Equipment used: Rolling walker (2 wheels), 1 person hand held assist Transfers: Sit to/from Stand             General transfer comment: attempted to stand with RW from elevated EOB but pt unable to sustain attention and follow commands to keep R LE NWB.  Attempted lateral scoot to EOB but pt unable to successfully complete      Balance Overall balance assessment: Needs assistance Sitting-balance support: No upper extremity supported, Feet supported Sitting balance-Leahy Scale: Fair                                     ADL either performed or assessed with clinical judgement   ADL Overall ADL's : Needs assistance/impaired Eating/Feeding: Set up;Supervision/ safety;Sitting Eating/Feeding Details (indicate cue type and reason): verbal cueing for initation Grooming: Minimal assistance;Sitting           Upper  Body Dressing : Minimal assistance;Sitting   Lower Body Dressing: Total assistance;Sitting/lateral leans;Bed level     Toilet Transfer Details (indicate cue type and reason): deferred         Functional mobility during ADLs: Minimal assistance General ADL Comments: to/from EOB     Vision   Vision Assessment?: No apparent visual  deficits     Perception         Praxis         Pertinent Vitals/Pain Pain Assessment Pain Assessment: Faces Faces Pain Scale: Hurts little more Pain Location: R foot Pain Descriptors / Indicators: Aching Pain Intervention(s): Limited activity within patient's tolerance, Monitored during session, Repositioned     Extremity/Trunk Assessment Upper Extremity Assessment Upper Extremity Assessment: Generalized weakness   Lower Extremity Assessment Lower Extremity Assessment: Defer to PT evaluation   Cervical / Trunk Assessment Cervical / Trunk Assessment: Normal   Communication Communication Communication: Impaired Factors Affecting Communication: Difficulty expressing self   Cognition Arousal: Alert Behavior During Therapy: Flat affect Cognition: Cognition impaired     Awareness: Intellectual awareness intact, Online awareness impaired Memory impairment (select all impairments): Short-term memory, Working memory Attention impairment (select first level of impairment): Focused attention Executive functioning impairment (select all impairments): Initiation, Organization, Sequencing, Problem solving, Reasoning OT - Cognition Comments: pt oriented, requires increased time to recall year.  Does not recall NWB to R LE, even immediately.  Requires cueing for initation of basic tasks, to even take a bite of her lunch.                 Following commands: Impaired Following commands impaired: Follows one step commands inconsistently, Follows one step commands with increased time     Cueing  General Comments   Cueing Techniques: Verbal cues;Tactile cues;Visual cues  VSS on RA   Exercises     Shoulder Instructions      Home Living Family/patient expects to be discharged to:: Private residence Living Arrangements: Alone Available Help at Discharge: Family Type of Home: House Home Access: Stairs to enter Secretary/administrator of Steps: 6 Entrance Stairs-Rails:  Can reach both Home Layout: One level     Bathroom Shower/Tub: Producer, Television/film/video: Standard     Home Equipment: Agricultural Consultant (2 wheels);Cane - single point;BSC/3in1;Shower seat   Additional Comments: unsure if pt has provided reliable reports due to AMS      Prior Functioning/Environment Prior Level of Function : Independent/Modified Independent             Mobility Comments: pt reports ambulating with RW or SPC for balance ADLs Comments: pt reports independence with ADLs, IADLs and driving    OT Problem List: Decreased strength;Decreased activity tolerance;Impaired balance (sitting and/or standing);Pain;Decreased knowledge of precautions;Decreased knowledge of use of DME or AE;Decreased safety awareness;Decreased cognition   OT Treatment/Interventions: Self-care/ADL training;Therapeutic exercise;DME and/or AE instruction;Therapeutic activities;Cognitive remediation/compensation;Patient/family education;Balance training      OT Goals(Current goals can be found in the care plan section)   Acute Rehab OT Goals Patient Stated Goal: home OT Goal Formulation: With patient Time For Goal Achievement: 02/16/24 Potential to Achieve Goals: Fair   OT Frequency:  Min 2X/week    Co-evaluation              AM-PAC OT 6 Clicks Daily Activity     Outcome Measure Help from another person eating meals?: A Little Help from another person taking care of personal grooming?: A Little Help from another person toileting, which includes using toliet,  bedpan, or urinal?: Total Help from another person bathing (including washing, rinsing, drying)?: A Lot Help from another person to put on and taking off regular upper body clothing?: A Little Help from another person to put on and taking off regular lower body clothing?: Total 6 Click Score: 13   End of Session Equipment Utilized During Treatment: Rolling walker (2 wheels) Nurse Communication: Mobility  status;Precautions  Activity Tolerance: Patient limited by fatigue Patient left: in bed;with call bell/phone within reach;with bed alarm set;with nursing/sitter in room  OT Visit Diagnosis: Other abnormalities of gait and mobility (R26.89);Muscle weakness (generalized) (M62.81);Pain;Other symptoms and signs involving cognitive function Pain - Right/Left: Right Pain - part of body: Ankle and joints of foot                Time: 8870-8848 OT Time Calculation (min): 22 min Charges:  OT General Charges $OT Visit: 1 Visit OT Evaluation $OT Eval Moderate Complexity: 1 Mod  Brandy Hall, OT Acute Rehabilitation Services Office [phone] Secure Chat Preferred    Brandy Hall Hope 02/02/2024, 12:08 PM

## 2024-02-02 NOTE — Progress Notes (Signed)
 PROGRESS NOTE    Brandy Hall  FMW:[id number] DOB: [birth year] DOA: 01/29/2024 PCP: Marelyn Quill, MD   Brief Narrative:  Brandy Hall is a 63 year old female with history of CAD, PAD status post bilateral common femoral iliac stenting on 01/23/2024, COPD, hypertension, CKD2, ongoing tobacco smoking, hyperlipidemia, who presents emergency department for chief concerns of right foot wound at the advice of podiatry.  Assessment & Plan:   Principal Problem:   Gangrene of toe of right foot (HCC) Active Problems:   Acute kidney injury superimposed on chronic kidney disease   Peripheral artery disease s/p right common Ilac stent placement 11/18   Essential hypertension   Insulin  dependent type 2 diabetes mellitus (HCC)   COPD (chronic obstructive pulmonary disease) (HCC)   Hyperlipidemia   History of CAD (coronary artery disease)   Continuous dependence on cigarette smoking  Sepsis secondary to gangrene of toe of right foot (HCC), resolving Complicated by ongoing tobacco use Status post amputation 01/31/2024 with podiatry, tolerated well Patient okay to continue dual antiplatelet therapy (asa/plavix ) per podiatry Continue Augmentin  for 5-day course per podiatry Pain management per podiatry -moderately well-controlled now that nerve block has worn off -pain still limits ambulation   Ambulatory dysfunction, acute  - Secondary to above, was hopeful for recovery postoperatively but continues to require 2+ assist with any exertion or ambulation.  Given patient's instability unclear if patient is safe to discharge home, case management/TOC consulted for further discussion in regards to SNF placement  Hospital delirium/sundowning, resolving Rule out polypharmacy - Patient much more awake alert and appropriate this morning, unfortunately no family at bedside to confirm approaching baseline - Polypharmacy is certainly a risk factor given recent anesthesia and narcotics - Continue  sleep wake cycle, supportive care, request family to be available at bedside if at all possible to help patient orient  Peripheral artery disease s/p right common Ilac stent placement 11/18 On aspirin  and Plavix  - ok to continue peri-operatively per podiatry   Acute kidney injury superimposed on chronic kidney disease 2 11/25: Resolved -back to baseline   Continuous dependence on cigarette smoking Counseling at bedside regarding the benefits of tobacco cessation in setting of PAD and gangrene of the right foot.   History of CAD (coronary artery disease) Aspirin  81 mg, Plavix  75 mg daily   Hyperlipidemia Ezetimibe  10 mg   COPD (chronic obstructive pulmonary disease) (HCC) Patient is not in acute exacerbation Long-acting inhaler/maintenance inhaler were resumed DuoNebs every 6 hours as needed for wheezing and shortness of breath   Insulin  dependent type 2 diabetes mellitus uncontrolled with hyperglycemia - A1c 8.7 - Long-acting insulin  halved to 12 units daily given previous episodes of hypoglycemia    Essential hypertension Amlodipine  5 mg daily, metoprolol  tartrate 25 mg p.o. twice daily  DVT prophylaxis: SCDs Start: 01/29/24 1925 Place TED hose Start: 01/29/24 1925   Code Status:   Code Status: Full Code  Family Communication: Daughter at bedside  Status is: Inpatient  Dispo: The patient is from: Home              Anticipated d/c is to: To be determined              Anticipated d/c date is: 48 to 72 hours              Patient currently not medically stable for discharge  Consultants:  Podiatry  Procedures:  Right transmetatarsal foot amputation 01/31/2024  Antimicrobials:  Perioperatively  Subjective: Notable hyperglycemia overnight resolved with supportive  care, otherwise denies nausea vomit diarrhea constipation confusion or chest pain.  Objective: Vitals:   02/02/24 0021 02/02/24 0445 02/02/24 0737 02/02/24 0858  BP: 125/75 117/74 136/88   Pulse: 82 80  86   Resp: 18 18 16    Temp: 99.5 F (37.5 C) 98.7 F (37.1 C) 98.8 F (37.1 C)   TempSrc:      SpO2: 96% 95% 94% 95%    Intake/Output Summary (Last 24 hours) at 02/02/2024 9096 Last data filed at 02/02/2024 0700 Gross per 24 hour  Intake 0 ml  Output --  Net 0 ml   There were no vitals filed for this visit.  Examination:  General:  Pleasantly resting in bed, No acute distress. HEENT:  Normocephalic atraumatic.  Sclerae nonicteric, noninjected.  Extraocular movements intact bilaterally. Neck:  Without mass or deformity.  Trachea is midline. Lungs:  Clear to auscultate bilaterally without rhonchi, wheeze, or rales. Heart:  Regular rate and rhythm.  Without murmurs, rubs, or gallops. Abdomen:  Soft, nontender, nondistended.  Without guarding or rebound. Extremities: Left foot bandage clean dry intact, in boot. Skin:  Warm and dry, no erythema.  Data Reviewed: I have personally reviewed following labs and imaging studies  CBC: Recent Labs  Lab 01/29/24 1524 01/30/24 0303 01/31/24 0435 02/01/24 0406 02/02/24 0414  WBC 8.4 7.6 7.4 7.3 10.6*  NEUTROABS 5.8  --   --   --   --   HGB 11.7* 11.4* 10.7* 10.5* 11.4*  HCT 35.9* 35.3* 32.8* 31.9* 33.4*  MCV 102.9* 105.4* 102.5* 102.2* 98.8  PLT 292 270 319 318 338   Basic Metabolic Panel: Recent Labs  Lab 01/29/24 1524 01/30/24 0303 01/31/24 0435 02/01/24 0406 02/02/24 0414  NA 132* 132* 136 132* 130*  K 3.9 4.1 3.3* 4.0 3.9  CL 94* 99 101 100 95*  CO2 26 20* 25 24 26   GLUCOSE 143* 138* 56* 95 57*  BUN 6* 6* 6* 5* 6*  CREATININE 1.22* 0.97 1.13* 0.99 1.04*  CALCIUM 8.8* 8.7* 8.6* 8.8* 9.0   GFR: Estimated Creatinine Clearance: 59.9 mL/min (A) (by C-G formula based on SCr of 1.04 mg/dL (H)). Liver Function Tests: Recent Labs  Lab 01/29/24 1524 01/30/24 0303  AST <10* 10*  ALT 7 7  ALKPHOS 68 65  BILITOT 0.5 0.9  PROT 7.1 6.6  ALBUMIN 2.7* 2.5*   CBG: Recent Labs  Lab 02/01/24 2142 02/01/24 2145  02/02/24 0440 02/02/24 0539 02/02/24 0612  GLUCAP 64* 140* 69* 127* 121*   Sepsis Labs: Recent Labs  Lab 01/29/24 1532  LATICACIDVEN 0.7    Recent Results (from the past 240 hours)  Blood culture (routine x 2)     Status: None (Preliminary result)   Collection Time: 01/29/24  5:38 PM   Specimen: BLOOD  Result Value Ref Range Status   Specimen Description BLOOD SITE NOT SPECIFIED  Final   Special Requests   Final    BOTTLES DRAWN AEROBIC AND ANAEROBIC Blood Culture results may not be optimal due to an inadequate volume of blood received in culture bottles   Culture   Final    NO GROWTH 3 DAYS Performed at [facility], [address] [place], [place] [address]    Report Status PENDING  Incomplete  Blood culture (routine x 2)     Status: None (Preliminary result)   Collection Time: 01/29/24  6:00 PM   Specimen: BLOOD RIGHT HAND  Result Value Ref Range Status   Specimen Description BLOOD RIGHT  HAND  Final   Special Requests   Final    BOTTLES DRAWN AEROBIC AND ANAEROBIC Blood Culture results may not be optimal due to an inadequate volume of blood received in culture bottles   Culture   Final    NO GROWTH 3 DAYS Performed at [facility], [address] [place], [place] [address]    Report Status PENDING  Incomplete         Radiology Studies: DG Foot 2 Views Right Result Date: 01/31/2024 EXAM: 1 OR 2 VIEW(S) XRAY OF THE RIGHT FOOT 01/31/2024 09:50:19 AM COMPARISON: 01/29/2024. CLINICAL HISTORY: [id number] Post-operative state 252351 Post-operative state FINDINGS: BONES AND JOINTS: Interval changes of transmetatarsal amputation of all 5 right toes. No complicating feature. No acute fracture. SOFT TISSUES: The soft tissues are unremarkable. IMPRESSION: 1. Interval transmetatarsal amputation of all five right toes without complicating feature. Electronically signed by: Kevin Dover MD 01/31/2024 08:02 PM EST RP Workstation: [id number]        Scheduled Meds:   amLODipine   5 mg Oral Daily   amoxicillin -clavulanate  1 tablet Oral Q12H   aspirin   81 mg Oral Daily   budesonide -glycopyrrolate -formoterol   2 puff Inhalation BID   clopidogrel   75 mg Oral Daily   docusate sodium   100 mg Oral BID   ezetimibe   10 mg Oral Daily   insulin  aspart  0-5 Units Subcutaneous QHS   insulin  aspart  0-9 Units Subcutaneous TID WC   insulin  glargine-yfgn  12 Units Subcutaneous Daily   metoprolol  tartrate  25 mg Oral BID   montelukast   10 mg Oral QHS   multivitamin with minerals  1 tablet Oral Daily   pantoprazole   40 mg Oral BID AC   pregabalin   50 mg Oral TID   ranolazine   500 mg Oral BID   sodium chloride  flush  3 mL Intravenous Q12H   sodium chloride  flush  3 mL Intravenous Q12H   Continuous Infusions:     LOS: 4 days   Time spent:  Elsie JAYSON Montclair, DO [facility]  If 7PM-7AM, please contact night-coverage [email or web address]  02/02/2024, 9:03 AM

## 2024-02-02 NOTE — Progress Notes (Signed)
 Hypoglycemic Event  CBG: 69  Treatment: 8 oz juice/soda  Symptoms: None  Follow-up CBG: Time:0539 CBG Result:127  Possible Reasons for Event: Inadequate meal intake  Comments/MD notified:yes    Zachary KANDICE Crystal RN

## 2024-02-02 NOTE — TOC Initial Note (Addendum)
 Transition of Care [facility]) - Initial/Assessment Note    Patient Details  Name: Brandy Hall MRN: [id number] Date of Birth: May 05, 1960  [facility] CM/SW Contact:    Bridget Cordella Simmonds, LCSW Phone Number: 02/02/2024, 1:42 PM  Clinical Narrative:     CSW met with pt regarding PT recommendation for SNF.  Pt oriented x4 but guarded, somewhat slow to respond.  Pt from home alone, no current services.  Pt states she does not want to go to SNF, plans to DC home.  CSW engaged her is discussion about needing help, supports available, whether daughter could assist.  Pt only giving limited response to this and again states she wants to DC home.  Permission given to speak with daughter Brandy Hall.  CSW left message with daughter Brandy Hall.  1330: Second attempt to reach daughter Brandy Hall by phone and text message. Awaiting response to see if she can assist pt at home or discuss SNF plan with pt.          1410: CSW received call from daughter Brandy Hall.  Updated her on above situation.  She reports pt actually lives with her adult granddaughter and the granddaughter's family but states that granddaughter works and would only be able to offer limited help.  Brandy Hall agreed to call and discuss DC plan with pt.         1600: CSW spoke with pt, pt states she did talk to her daughter, more conversation about SNF, pt continuing to say she wants to DC home.   Expected Discharge Plan: Skilled Nursing Facility     Patient Goals and CMS Choice Patient states their goals for this hospitalization and ongoing recovery are:: go home          Expected Discharge Plan and Services In-house Referral: Clinical Social Work   Post Acute Care Choice: Home Health Living arrangements for the past 2 months: Single Family Home                                      Prior Living Arrangements/Services Living arrangements for the past 2 months: Single Family Home Lives with:: Self Patient language and need for  interpreter reviewed:: Yes        Need for Family Participation in Patient Care: Yes (Comment) Care giver support system in place?: Yes (comment) Current home services: Other (comment) (none) Criminal Activity/Legal Involvement Pertinent to Current Situation/Hospitalization: No - Comment as needed  Activities of Daily Living   ADL Screening (condition at time of admission) Independently performs ADLs?: Yes (appropriate for developmental age) Is the patient deaf or have difficulty hearing?: No Does the patient have difficulty seeing, even when wearing glasses/contacts?: No Does the patient have difficulty concentrating, remembering, or making decisions?: No  Permission Sought/Granted Permission sought to share information with : Family Supports Permission granted to share information with : Yes, Verbal Permission Granted  Share Information with NAME: daughter Brandy Hall           Emotional Assessment Appearance:: Appears stated age Attitude/Demeanor/Rapport: Engaged Affect (typically observed): Guarded Orientation: : Oriented to Self, Oriented to Place, Oriented to  Time, Oriented to Situation      Admission diagnosis:  Right foot infection [L08.9] Gangrene of toe of right foot (HCC) [I96] Patient Active Problem List   Diagnosis Date Noted   Acute kidney injury superimposed on chronic kidney disease 01/29/2024   Peripheral artery disease s/p right  common Ilac stent placement 11/18 01/29/2024   History of CAD (coronary artery disease) 01/29/2024   Continuous dependence on cigarette smoking 01/29/2024   Gangrene of toe of right foot (HCC) 01/20/2024   Cellulitis of right toe 01/19/2024   Dysphagia 08/16/2023   Insulin  dependent type 2 diabetes mellitus (HCC) 06/04/2023   Abdominal bloating 06/04/2023   Allergic rhinitis 06/04/2023   BRBPR (bright red blood per rectum) 06/04/2023   Colon polyp 06/04/2023   Diverticulosis 06/04/2023   GERD (gastroesophageal reflux disease)  06/04/2023   History of myocardial infarction 06/04/2023   IBS (irritable bowel syndrome) 06/04/2023   Orthostatic hypotension 11/18/2022   Precordial chest pain 11/18/2022   Peripheral edema 07/02/2019   Arthritis 10/28/2014   Hyperlipidemia 10/28/2014   Palpitations 10/28/2014   Sleep apnea 10/28/2014   Coronary artery disease involving native coronary artery of native heart with angina pectoris 07/30/2014   Stable angina 07/30/2014   Tobacco use disorder 11/12/2013   COPD (chronic obstructive pulmonary disease) (HCC) 11/12/2013   Essential hypertension    Hypercholesteremia    Seasonal allergies    Chronic headaches    Headache, migraine 10/03/2013   Trigeminal neuralgia 10/03/2013   PCP:  Marelyn Quill, MD Pharmacy:   [id number] [place], [place] - [address] [place] [address] Phone: [phone] Fax: [phone]  [facility] [address] [place] [address] Phone: [phone] Fax: [phone]     Social Drivers of Health (SDOH) Social History: SDOH Screenings   Food Insecurity: No Food Insecurity (01/30/2024)  Housing: Low Risk  (01/30/2024)  Transportation Needs: No Transportation Needs (01/30/2024)  Utilities: Not At Risk (01/30/2024)  Social Connections: Unknown (01/30/2024)  Tobacco Use: High Risk (01/31/2024)   SDOH Interventions:     Readmission Risk Interventions     No data to display

## 2024-02-02 NOTE — Progress Notes (Signed)
   PODIATRY PROGRESS NOTE Patient Name: Brandy Hall  DOB 09-14-1960 DOA 01/29/2024  Hospital Day: 5  Assessment:  63 y.o. female who is 2 days status post right foot transmetatarsal amputation with application of amniotic allograft.  She states that she is doing well in her postoperative recovery.  She does experience some pain to the surgical site.  No complications..  AF, VSS  WBC: 10.6   Pathology: In process  Imaging: IMPRESSION: 1. Interval transmetatarsal amputation of all five right toes without complicating feature.  Plan:  - 2 days status post right foot TMA with application of graft, doing well - No sign of continued infection, hematoma or other complication to surgical site. - Patient is stable for discharge from podiatric standpoint.  Increasing white blood cell count is noted however there are currently no signs of infection to the surgical site. -Recommend discharge on oral antibiotics - Follow-up at [facility] clinic next week, scheduled for followup 02/08/24 - Leave dressing clean, dry, intact         Prentice Ovens, DPM [facility]    Subjective:  63 year old female patient 2 days status post left transmetatarsal amputation with graft application.  She is doing well in her postoperative recovery.  She does admit to having some pain which is controlled utilizing current medications.  Objective:   Vitals:   02/02/24 0021 02/02/24 0445  BP: 125/75 117/74  Pulse: 82 80  Resp: 18 18  Temp: 99.5 F (37.5 C) 98.7 F (37.1 C)  SpO2: 96% 95%       Latest Ref Rng & Units 02/02/2024    4:14 AM 02/01/2024    4:06 AM 01/31/2024    4:35 AM  CBC  WBC 4.0 - 10.5 K/uL 10.6  7.3  7.4   Hemoglobin 12.0 - 15.0 g/dL 88.5  89.4  89.2   Hematocrit 36.0 - 46.0 % 33.4  31.9  32.8   Platelets 150 - 400 K/uL 338  318  319        Latest Ref Rng & Units 02/02/2024    4:14 AM 02/01/2024    4:06 AM 01/31/2024    4:35 AM  BMP   Glucose 70 - 99 mg/dL 57  95  56   BUN 8 - 23 mg/dL 6  5  6    Creatinine 0.44 - 1.00 mg/dL 8.95  9.00  8.86   Sodium 135 - 145 mmol/L 130  132  136   Potassium 3.5 - 5.1 mmol/L 3.9  4.0  3.3   Chloride 98 - 111 mmol/L 95  100  101   CO2 22 - 32 mmol/L 26  24  25    Calcium 8.9 - 10.3 mg/dL 9.0  8.8  8.6     General: AAOx3, NAD  Lower Extremity Exam Right lower extremity status post transmetatarsal amputation.  Staples are intact.  Minor serosanguineous drainage.  No drainage with palpation of surgical site.  Minor erythema, edema consistent with postoperative state.  No sign of continued infection.  Incision edges and flaps appear viable at this time with adequate perfusion.   Radiology:  Results reviewed. See assessment for pertinent imaging results

## 2024-02-03 DIAGNOSIS — I96 Gangrene, not elsewhere classified: Secondary | ICD-10-CM | POA: Diagnosis not present

## 2024-02-03 LAB — GLUCOSE, CAPILLARY
Glucose-Capillary: 106 mg/dL — ABNORMAL HIGH (ref 70–99)
Glucose-Capillary: 96 mg/dL (ref 70–99)
Glucose-Capillary: 138 mg/dL — ABNORMAL HIGH (ref 70–99)
Glucose-Capillary: 209 mg/dL — ABNORMAL HIGH (ref 70–99)
Glucose-Capillary: 81 mg/dL (ref 70–99)

## 2024-02-03 LAB — CULTURE, BLOOD (ROUTINE X 2)
Culture: NO GROWTH
Culture: NO GROWTH

## 2024-02-03 NOTE — Progress Notes (Signed)
 PROGRESS NOTE    Brandy Hall  FMW:[id number] DOB: Aug 12, 1960 DOA: 01/29/2024 PCP: Marelyn Quill, MD   Brief Narrative:  [name] is a 63 year old female with history of CAD, PAD status post bilateral common femoral iliac stenting on 01/23/2024, COPD, hypertension, CKD2, ongoing tobacco smoking, hyperlipidemia, who presents emergency department for chief concerns of right foot wound at the advice of podiatry.  Assessment & Plan:   Principal Problem:   Gangrene of toe of right foot (HCC) Active Problems:   Acute kidney injury superimposed on chronic kidney disease   Peripheral artery disease s/p right common Ilac stent placement 11/18   Essential hypertension   Insulin  dependent type 2 diabetes mellitus (HCC)   COPD (chronic obstructive pulmonary disease) (HCC)   Hyperlipidemia   History of CAD (coronary artery disease)   Continuous dependence on cigarette smoking  Sepsis secondary to gangrene of toe of right foot (HCC), resolving Complicated by ongoing tobacco use Status post amputation 01/31/2024 with podiatry, tolerated well Patient okay to continue dual antiplatelet therapy (asa/plavix ) per podiatry Continue Augmentin  for 5-day course per podiatry Pain management per podiatry -moderately well-controlled now that nerve block has worn off -pain still limits ambulation   Ambulatory dysfunction, acute  - Secondary to above, was hopeful for recovery postoperatively but continues to require 2+ assist with any exertion or ambulation.  Given patient's instability unclear if patient is safe to discharge home, case management/TOC consulted for further discussion in regards to SNF placement  Hospital delirium/sundowning, resolving Rule out polypharmacy - Patient much more awake alert and appropriate this morning, unfortunately no family at bedside to confirm approaching baseline - Polypharmacy is certainly a risk factor given recent anesthesia and narcotics - Continue  sleep wake cycle, supportive care, request family to be available at bedside if at all possible to help patient orient  Peripheral artery disease s/p right common Ilac stent placement 11/18 On aspirin  and Plavix  - ok to continue peri-operatively per podiatry   Acute kidney injury superimposed on chronic kidney disease 2 11/25: Resolved -back to baseline   Continuous dependence on cigarette smoking Counseling at bedside regarding the benefits of tobacco cessation in setting of PAD and gangrene of the right foot.   History of CAD (coronary artery disease) Aspirin  81 mg, Plavix  75 mg daily   Hyperlipidemia Ezetimibe  10 mg   COPD (chronic obstructive pulmonary disease) (HCC) Patient is not in acute exacerbation Long-acting inhaler/maintenance inhaler were resumed DuoNebs every 6 hours as needed for wheezing and shortness of breath   Insulin  dependent type 2 diabetes mellitus uncontrolled with hyperglycemia - A1c 8.7 - Long-acting insulin  halved to 12 units daily given previous episodes of hypoglycemia -currently well-controlled   Essential hypertension Amlodipine  5 mg daily, metoprolol  tartrate 25 mg p.o. twice daily  DVT prophylaxis: SCDs Start: 01/29/24 1925 Place TED hose Start: 01/29/24 1925   Code Status:   Code Status: Full Code  Family Communication: Daughter at bedside  Status is: Inpatient  Dispo: The patient is from: Home              Anticipated d/c is to: To be determined              Anticipated d/c date is: 48 to 72 hours              Patient currently not medically stable for discharge  Consultants:  Podiatry  Procedures:  Right transmetatarsal foot amputation 01/31/2024  Antimicrobials:  Perioperatively  Subjective: Notable hyperglycemia overnight resolved with  supportive care, otherwise denies nausea vomit diarrhea constipation confusion or chest pain.  Objective: Vitals:   02/02/24 1900 02/02/24 2209 02/02/24 2215 02/03/24 0500  BP:  (!) 87/62  115/68 110/65  Pulse:  88  80  Resp:  18  16  Temp:  99.4 F (37.4 C)  98.8 F (37.1 C)  TempSrc:  Oral  Oral  SpO2: 92% 94%  96%    Intake/Output Summary (Last 24 hours) at 02/03/2024 0743 Last data filed at 02/02/2024 1700 Gross per 24 hour  Intake 120 ml  Output --  Net 120 ml   There were no vitals filed for this visit.  Examination:  General:  Pleasantly resting in bed, No acute distress. HEENT:  Normocephalic atraumatic.  Sclerae nonicteric, noninjected.  Extraocular movements intact bilaterally. Neck:  Without mass or deformity.  Trachea is midline. Lungs:  Clear to auscultate bilaterally without rhonchi, wheeze, or rales. Heart:  Regular rate and rhythm.  Without murmurs, rubs, or gallops. Abdomen:  Soft, nontender, nondistended.  Without guarding or rebound. Extremities: Left foot bandage clean dry intact, in boot. Skin:  Warm and dry, no erythema.  Data Reviewed: I have personally reviewed following labs and imaging studies  CBC: Recent Labs  Lab 01/29/24 1524 01/30/24 0303 01/31/24 0435 02/01/24 0406 02/02/24 0414  WBC 8.4 7.6 7.4 7.3 10.6*  NEUTROABS 5.8  --   --   --   --   HGB 11.7* 11.4* 10.7* 10.5* 11.4*  HCT 35.9* 35.3* 32.8* 31.9* 33.4*  MCV 102.9* 105.4* 102.5* 102.2* 98.8  PLT 292 270 319 318 338   Basic Metabolic Panel: Recent Labs  Lab 01/29/24 1524 01/30/24 0303 01/31/24 0435 02/01/24 0406 02/02/24 0414  NA 132* 132* 136 132* 130*  K 3.9 4.1 3.3* 4.0 3.9  CL 94* 99 101 100 95*  CO2 26 20* 25 24 26   GLUCOSE 143* 138* 56* 95 57*  BUN 6* 6* 6* 5* 6*  CREATININE 1.22* 0.97 1.13* 0.99 1.04*  CALCIUM 8.8* 8.7* 8.6* 8.8* 9.0   GFR: Estimated Creatinine Clearance: 59.9 mL/min (A) (by C-G formula based on SCr of 1.04 mg/dL (H)). Liver Function Tests: Recent Labs  Lab 01/29/24 1524 01/30/24 0303  AST <10* 10*  ALT 7 7  ALKPHOS 68 65  BILITOT 0.5 0.9  PROT 7.1 6.6  ALBUMIN 2.7* 2.5*   CBG: Recent Labs  Lab 02/02/24 0612  02/02/24 1147 02/02/24 1552 02/02/24 2122 02/03/24 0610  GLUCAP 121* 143* 149* 100* 106*   Sepsis Labs: Recent Labs  Lab 01/29/24 1532  LATICACIDVEN 0.7    Recent Results (from the past 240 hours)  Blood culture (routine x 2)     Status: None (Preliminary result)   Collection Time: 01/29/24  5:38 PM   Specimen: BLOOD  Result Value Ref Range Status   Specimen Description BLOOD SITE NOT SPECIFIED  Final   Special Requests   Final    BOTTLES DRAWN AEROBIC AND ANAEROBIC Blood Culture results may not be optimal due to an inadequate volume of blood received in culture bottles   Culture   Final    NO GROWTH 4 DAYS Performed at [facility], [address] [place], [place] [address]    Report Status PENDING  Incomplete  Blood culture (routine x 2)     Status: None (Preliminary result)   Collection Time: 01/29/24  6:00 PM   Specimen: BLOOD RIGHT HAND  Result Value Ref Range Status   Specimen Description BLOOD RIGHT HAND  Final   Special Requests   Final    BOTTLES DRAWN AEROBIC AND ANAEROBIC Blood Culture results may not be optimal due to an inadequate volume of blood received in culture bottles   Culture   Final    NO GROWTH 4 DAYS Performed at [facility], [address], [place], [place] [address]    Report Status PENDING  Incomplete         Radiology Studies: No results found.       Scheduled Meds:  amLODipine   5 mg Oral Daily   amoxicillin -clavulanate  1 tablet Oral Q12H   aspirin   81 mg Oral Daily   budesonide -glycopyrrolate -formoterol   2 puff Inhalation BID   clopidogrel   75 mg Oral Daily   docusate sodium   100 mg Oral BID   ezetimibe   10 mg Oral Daily   insulin  aspart  0-5 Units Subcutaneous QHS   insulin  aspart  0-9 Units Subcutaneous TID WC   insulin  glargine-yfgn  12 Units Subcutaneous Daily   metoprolol  tartrate  25 mg Oral BID   montelukast   10 mg Oral QHS   multivitamin with minerals  1 tablet Oral Daily   pantoprazole   40 mg Oral  BID AC   pregabalin   50 mg Oral TID   ranolazine   500 mg Oral BID   sodium chloride  flush  3 mL Intravenous Q12H   sodium chloride  flush  3 mL Intravenous Q12H   Continuous Infusions:     LOS: 5 days   Time spent:  Brandy JAYSON Montclair, DO [facility]  If 7PM-7AM, please contact night-coverage [email or web address]  02/03/2024, 7:43 AM

## 2024-02-03 NOTE — Plan of Care (Signed)
  Problem: Nutritional: Goal: Maintenance of adequate nutrition will improve Outcome: Progressing   Problem: Skin Integrity: Goal: Risk for impaired skin integrity will decrease Outcome: Progressing   Problem: Activity: Goal: Risk for activity intolerance will decrease Outcome: Progressing   Problem: Nutrition: Goal: Adequate nutrition will be maintained Outcome: Progressing   Problem: Coping: Goal: Level of anxiety will decrease Outcome: Progressing

## 2024-02-03 NOTE — Plan of Care (Signed)
  Problem: Skin Integrity: Goal: Risk for impaired skin integrity will decrease Outcome: Progressing   Problem: Activity: Goal: Risk for activity intolerance will decrease Outcome: Progressing   Problem: Elimination: Goal: Will not experience complications related to bowel motility Outcome: Progressing   Problem: Pain Managment: Goal: General experience of comfort will improve and/or be controlled Outcome: Progressing

## 2024-02-04 DIAGNOSIS — I96 Gangrene, not elsewhere classified: Secondary | ICD-10-CM | POA: Diagnosis not present

## 2024-02-04 LAB — GLUCOSE, CAPILLARY
Glucose-Capillary: 98 mg/dL (ref 70–99)
Glucose-Capillary: 85 mg/dL (ref 70–99)
Glucose-Capillary: 172 mg/dL — ABNORMAL HIGH (ref 70–99)
Glucose-Capillary: 141 mg/dL — ABNORMAL HIGH (ref 70–99)
Glucose-Capillary: 148 mg/dL — ABNORMAL HIGH (ref 70–99)

## 2024-02-04 MED ORDER — ENSURE PLUS HIGH PROTEIN PO LIQD
237.0000 mL | Freq: Two times a day (BID) | ORAL | Status: DC
  Administered 2024-02-06: 237 mL via ORAL

## 2024-02-04 NOTE — Progress Notes (Signed)
 PROGRESS NOTE    Brandy Hall  FMW:[id number] DOB: 11-Feb-1961 DOA: 01/29/2024 PCP: Marelyn Quill, MD   Brief Narrative:  [name] is a 63 year old female with history of CAD, PAD status post bilateral common femoral iliac stenting on 01/23/2024, COPD, hypertension, CKD2, ongoing tobacco smoking, hyperlipidemia, who presents emergency department for chief concerns of right foot wound at the advice of podiatry.  Assessment & Plan:   Principal Problem:   Gangrene of toe of right foot (HCC) Active Problems:   Acute kidney injury superimposed on chronic kidney disease   Peripheral artery disease s/p right common Ilac stent placement 11/18   Essential hypertension   Insulin  dependent type 2 diabetes mellitus (HCC)   COPD (chronic obstructive pulmonary disease) (HCC)   Hyperlipidemia   History of CAD (coronary artery disease)   Continuous dependence on cigarette smoking  Sepsis secondary to gangrene of toe of right foot (HCC), resolving Complicated by ongoing tobacco use Status post amputation 01/31/2024 with podiatry, tolerated well Patient okay to continue dual antiplatelet therapy (asa/plavix ) per podiatry Continue Augmentin  for 5-day course per podiatry Pain management per podiatry -moderately well-controlled now that nerve block has worn off -pain still limits ambulation   Ambulatory dysfunction, acute  - Secondary to above, was hopeful for recovery postoperatively but continues to require 2+ assist with any exertion or ambulation.  Given patient's instability unclear if patient is safe to discharge home, case management/TOC consulted for further discussion in regards to SNF placement  Hospital delirium/sundowning, resolving Rule out polypharmacy - Patient much more awake alert and appropriate this morning, unfortunately no family at bedside to confirm approaching baseline - Polypharmacy is certainly a risk factor given recent anesthesia and narcotics - Continue  sleep wake cycle, supportive care, request family to be available at bedside if at all possible to help patient orient  Peripheral artery disease s/p right common Ilac stent placement 11/18 On aspirin  and Plavix  - ok to continue peri-operatively per podiatry   Acute kidney injury superimposed on chronic kidney disease 2 11/25: Resolved -back to baseline   Continuous dependence on cigarette smoking Counseling at bedside regarding the benefits of tobacco cessation in setting of PAD and gangrene of the right foot.   History of CAD (coronary artery disease) Aspirin  81 mg, Plavix  75 mg daily   Hyperlipidemia Ezetimibe  10 mg   COPD (chronic obstructive pulmonary disease) (HCC) Patient is not in acute exacerbation Long-acting inhaler/maintenance inhaler were resumed DuoNebs every 6 hours as needed for wheezing and shortness of breath   Insulin  dependent type 2 diabetes mellitus uncontrolled with hyperglycemia - A1c 8.7 - Long-acting insulin  halved to 12 units daily given previous episodes of hypoglycemia -currently well-controlled   Essential hypertension Amlodipine  5 mg daily, metoprolol  tartrate 25 mg p.o. twice daily  DVT prophylaxis: SCDs Start: 01/29/24 1925 Place TED hose Start: 01/29/24 1925   Code Status:   Code Status: Full Code  Family Communication: Daughter at bedside  Status is: Inpatient  Dispo: The patient is from: Home              Anticipated d/c is to: SNF              Anticipated d/c date is: Pending safe disposition              Patient currently is medically stable for discharge  Consultants:  Podiatry  Procedures:  Right transmetatarsal foot amputation 01/31/2024  Antimicrobials:  Perioperatively  Subjective: No acute issues or events overnight denies nausea  vomiting diarrhea constipation high fevers chills or chest pain  Objective: Vitals:   02/03/24 1608 02/03/24 1923 02/04/24 0547 02/04/24 0722  BP: 109/85 122/68 109/80 116/65  Pulse: 78  86 79 78  Resp: 16 18 16 16   Temp: 98.4 F (36.9 C) 98.7 F (37.1 C) 98 F (36.7 C) 97.8 F (36.6 C)  TempSrc:  Oral Oral   SpO2: 97% 94% 100%     Intake/Output Summary (Last 24 hours) at 02/04/2024 0740 Last data filed at 02/03/2024 1841 Gross per 24 hour  Intake 480 ml  Output --  Net 480 ml   There were no vitals filed for this visit.  Examination:  General:  Pleasantly resting in bed, No acute distress. HEENT:  Normocephalic atraumatic.  Sclerae nonicteric, noninjected.  Extraocular movements intact bilaterally. Neck:  Without mass or deformity.  Trachea is midline. Lungs:  Clear to auscultate bilaterally without rhonchi, wheeze, or rales. Heart:  Regular rate and rhythm.  Without murmurs, rubs, or gallops. Abdomen:  Soft, nontender, nondistended.  Without guarding or rebound. Extremities: Left foot bandage clean dry intact Skin:  Warm and dry, no erythema.  Data Reviewed: I have personally reviewed following labs and imaging studies  CBC: Recent Labs  Lab 01/29/24 1524 01/30/24 0303 01/31/24 0435 02/01/24 0406 02/02/24 0414  WBC 8.4 7.6 7.4 7.3 10.6*  NEUTROABS 5.8  --   --   --   --   HGB 11.7* 11.4* 10.7* 10.5* 11.4*  HCT 35.9* 35.3* 32.8* 31.9* 33.4*  MCV 102.9* 105.4* 102.5* 102.2* 98.8  PLT 292 270 319 318 338   Basic Metabolic Panel: Recent Labs  Lab 01/29/24 1524 01/30/24 0303 01/31/24 0435 02/01/24 0406 02/02/24 0414  NA 132* 132* 136 132* 130*  K 3.9 4.1 3.3* 4.0 3.9  CL 94* 99 101 100 95*  CO2 26 20* 25 24 26   GLUCOSE 143* 138* 56* 95 57*  BUN 6* 6* 6* 5* 6*  CREATININE 1.22* 0.97 1.13* 0.99 1.04*  CALCIUM 8.8* 8.7* 8.6* 8.8* 9.0   GFR: Estimated Creatinine Clearance: 59.9 mL/min (A) (by C-G formula based on SCr of 1.04 mg/dL (H)). Liver Function Tests: Recent Labs  Lab 01/29/24 1524 01/30/24 0303  AST <10* 10*  ALT 7 7  ALKPHOS 68 65  BILITOT 0.5 0.9  PROT 7.1 6.6  ALBUMIN 2.7* 2.5*   CBG: Recent Labs  Lab 02/03/24 0745  02/03/24 1206 02/03/24 1650 02/03/24 2130 02/04/24 0634  GLUCAP 96 138* 209* 81 98   Sepsis Labs: Recent Labs  Lab 01/29/24 1532  LATICACIDVEN 0.7    Recent Results (from the past 240 hours)  Blood culture (routine x 2)     Status: None   Collection Time: 01/29/24  5:38 PM   Specimen: BLOOD  Result Value Ref Range Status   Specimen Description BLOOD SITE NOT SPECIFIED  Final   Special Requests   Final    BOTTLES DRAWN AEROBIC AND ANAEROBIC Blood Culture results may not be optimal due to an inadequate volume of blood received in culture bottles   Culture   Final    NO GROWTH 5 DAYS Performed at [facility], [address] [place], [place] [address]    Report Status 02/03/2024 FINAL  Final  Blood culture (routine x 2)     Status: None   Collection Time: 01/29/24  6:00 PM   Specimen: BLOOD RIGHT HAND  Result Value Ref Range Status   Specimen Description BLOOD RIGHT HAND  Final  Special Requests   Final    BOTTLES DRAWN AEROBIC AND ANAEROBIC Blood Culture results may not be optimal due to an inadequate volume of blood received in culture bottles   Culture   Final    NO GROWTH 5 DAYS Performed at [facility], [address] [place], [place] [address]    Report Status 02/03/2024 FINAL  Final         Radiology Studies: No results found.       Scheduled Meds:  amLODipine   5 mg Oral Daily   amoxicillin -clavulanate  1 tablet Oral Q12H   aspirin   81 mg Oral Daily   budesonide -glycopyrrolate -formoterol   2 puff Inhalation BID   clopidogrel   75 mg Oral Daily   docusate sodium   100 mg Oral BID   ezetimibe   10 mg Oral Daily   insulin  aspart  0-5 Units Subcutaneous QHS   insulin  aspart  0-9 Units Subcutaneous TID WC   insulin  glargine-yfgn  12 Units Subcutaneous Daily   metoprolol  tartrate  25 mg Oral BID   montelukast   10 mg Oral QHS   multivitamin with minerals  1 tablet Oral Daily   pantoprazole   40 mg Oral BID AC   pregabalin   50 mg Oral TID    ranolazine   500 mg Oral BID   sodium chloride  flush  3 mL Intravenous Q12H   sodium chloride  flush  3 mL Intravenous Q12H   Continuous Infusions:     LOS: 6 days   Time spent:  Elsie JAYSON Montclair, DO [facility]  If 7PM-7AM, please contact night-coverage [email or web address]  02/04/2024, 7:40 AM

## 2024-02-04 NOTE — TOC Progression Note (Addendum)
 Transition of Care [facility]) - Progression Note    Patient Details  Name: Brandy Hall MRN: [id number] Date of Birth: March 21, 1960  [facility] CM/SW Contact  Luellen Howson A Audray Rumore, LCSW Phone Number: 02/04/2024, 1:06 PM  Clinical Narrative:     CSW was notified by nursing that pt is now interested in going to SNF at [facility]. CSW met with pt in room, confirmed she is agreeable. CSW sent out bed offers for placement in [place] area. Bed offers pending. CSW contacted daughter [name] to provide update on dispo plan.   CSW will continue to follow.  Expected Discharge Plan: Skilled Nursing Facility                 Expected Discharge Plan and Services In-house Referral: Clinical Social Work   Post Acute Care Choice: Home Health Living arrangements for the past 2 months: Single Family Home                                       Social Drivers of Health (SDOH) Interventions SDOH Screenings   Food Insecurity: No Food Insecurity (01/30/2024)  Housing: Low Risk  (01/30/2024)  Transportation Needs: No Transportation Needs (01/30/2024)  Utilities: Not At Risk (01/30/2024)  Social Connections: Unknown (01/30/2024)  Tobacco Use: High Risk (01/31/2024)    Readmission Risk Interventions     No data to display

## 2024-02-04 NOTE — [place] FL2 (Signed)
 [place]  MEDICAID FL2 LEVEL OF CARE FORM     IDENTIFICATION  Patient Name: Brandy Hall Birthdate: [birth year] Sex: female Admission Date (Current Location): 01/29/2024  [facility] and [place] Number:  [employer] and Address:  [facility], [address] [place], [place] [address]      Provider Number: [id number]  Attending Physician Name and Address:  Lue Elsie BROCKS, MD  Relative Name and Phone Number:  Ross,Robin (Daughter)  [phone]    Current Level of Care: Hospital Recommended Level of Care: Skilled Nursing Facility Prior Approval Number:    Date Approved/Denied:   PASRR Number: [id number]  Discharge Plan: SNF    Current Diagnoses: Patient Active Problem List   Diagnosis Date Noted   Acute kidney injury superimposed on chronic kidney disease 01/29/2024   Peripheral artery disease s/p right common Ilac stent placement 11/18 01/29/2024   History of CAD (coronary artery disease) 01/29/2024   Continuous dependence on cigarette smoking 01/29/2024   Gangrene of toe of right foot (HCC) 01/20/2024   Cellulitis of right toe 01/19/2024   Dysphagia 08/16/2023   Insulin  dependent type 2 diabetes mellitus (HCC) 06/04/2023   Abdominal bloating 06/04/2023   Allergic rhinitis 06/04/2023   BRBPR (bright red blood per rectum) 06/04/2023   Colon polyp 06/04/2023   Diverticulosis 06/04/2023   GERD (gastroesophageal reflux disease) 06/04/2023   History of myocardial infarction 06/04/2023   IBS (irritable bowel syndrome) 06/04/2023   Orthostatic hypotension 11/18/2022   Precordial chest pain 11/18/2022   Peripheral edema 07/02/2019   Arthritis 10/28/2014   Hyperlipidemia 10/28/2014   Palpitations 10/28/2014   Sleep apnea 10/28/2014   Coronary artery disease involving native coronary artery of native heart with angina pectoris 07/30/2014   Stable angina 07/30/2014   Tobacco use disorder 11/12/2013   COPD (chronic obstructive  pulmonary disease) (HCC) 11/12/2013   Essential hypertension    Hypercholesteremia    Seasonal allergies    Chronic headaches    Headache, migraine 10/03/2013   Trigeminal neuralgia 10/03/2013    Orientation RESPIRATION BLADDER Height & Weight     Self, Time, Situation, Place  Normal Continent Weight:   Height:     BEHAVIORAL SYMPTOMS/MOOD NEUROLOGICAL BOWEL NUTRITION STATUS      Continent Diet (see DC summary)  AMBULATORY STATUS COMMUNICATION OF NEEDS Skin   Extensive Assist Verbally Other (Comment), Surgical wounds (Surgical Closed Surgical Incision Groin Right. Surgical Closed Surgical Incision Foot Right)                       Personal Care Assistance Level of Assistance  Bathing, Feeding, Dressing Bathing Assistance: Maximum assistance Feeding assistance: Limited assistance Dressing Assistance: Maximum assistance     Functional Limitations Info  Sight, Hearing, Speech Sight Info: Adequate Hearing Info: Adequate Speech Info: Adequate    SPECIAL CARE FACTORS FREQUENCY  PT (By licensed PT), OT (By licensed OT)     PT Frequency: 5x/week OT Frequency: 5x/week            Contractures Contractures Info: Not present    Additional Factors Info  Code Status, Allergies, Insulin  Sliding Scale Code Status Info: FULL Allergies Info: Crestor (Rosuvastatin)  Percocet (Oxycodone -acetaminophen )  Statins  Trulicity (Dulaglutide)   Insulin  Sliding Scale Info: see DC summary       Current Medications (02/04/2024):  This is the current hospital active medication list Current Facility-Administered Medications  Medication Dose Route Frequency Provider Last Rate Last Admin   acetaminophen  (  TYLENOL ) tablet 650 mg  650 mg Oral Q6H PRN Sundil, Subrina, MD       Or   acetaminophen  (TYLENOL ) suppository 650 mg  650 mg Rectal Q6H PRN Sundil, Subrina, MD       amLODipine  (NORVASC ) tablet 5 mg  5 mg Oral Daily Sundil, Subrina, MD   5 mg at 02/04/24 9070    amoxicillin -clavulanate (AUGMENTIN ) 875-125 MG per tablet 1 tablet  1 tablet Oral Q12H Malvin Marsa FALCON, DPM   1 tablet at 02/04/24 9071   aspirin  chewable tablet 81 mg  81 mg Oral Daily Sundil, Subrina, MD   81 mg at 02/04/24 9070   budesonide -glycopyrrolate -formoterol  (BREZTRI ) 160-9-4.8 MCG/ACT inhaler 2 puff  2 puff Inhalation BID Sundil, Subrina, MD   2 puff at 02/04/24 0825   clopidogrel  (PLAVIX ) tablet 75 mg  75 mg Oral Daily Cox, Amy N, DO   75 mg at 02/04/24 9070   docusate sodium  (COLACE) capsule 100 mg  100 mg Oral BID Sundil, Subrina, MD   100 mg at 02/04/24 9070   ezetimibe  (ZETIA ) tablet 10 mg  10 mg Oral Daily Sundil, Subrina, MD   10 mg at 02/04/24 9070   hydrALAZINE  (APRESOLINE ) injection 5 mg  5 mg Intravenous Q6H PRN Cox, Amy N, DO       insulin  aspart (novoLOG ) injection 0-5 Units  0-5 Units Subcutaneous QHS Sundil, Subrina, MD       insulin  aspart (novoLOG ) injection 0-9 Units  0-9 Units Subcutaneous TID WC Sundil, Subrina, MD   2 Units at 02/04/24 1248   insulin  glargine-yfgn (SEMGLEE ) injection 12 Units  12 Units Subcutaneous Daily Lue Elsie BROCKS, MD   12 Units at 02/03/24 9078   ipratropium-albuterol  (DUONEB) 0.5-2.5 (3) MG/3ML nebulizer solution 3 mL  3 mL Nebulization Q6H PRN Sundil, Subrina, MD       metoprolol  tartrate (LOPRESSOR ) tablet 25 mg  25 mg Oral BID Sundil, Subrina, MD   25 mg at 02/04/24 9071   montelukast  (SINGULAIR ) tablet 10 mg  10 mg Oral QHS Sundil, Subrina, MD   10 mg at 02/03/24 2133   multivitamin with minerals tablet 1 tablet  1 tablet Oral Daily Cox, Amy N, DO   1 tablet at 02/04/24 9071   ondansetron  (ZOFRAN ) tablet 4 mg  4 mg Oral Q6H PRN Sundil, Subrina, MD       Or   ondansetron  (ZOFRAN ) injection 4 mg  4 mg Intravenous Q6H PRN Sundil, Subrina, MD       pantoprazole  (PROTONIX ) EC tablet 40 mg  40 mg Oral BID AC Sundil, Subrina, MD   40 mg at 02/04/24 9070   pregabalin  (LYRICA ) capsule 50 mg  50 mg Oral TID Sundil, Subrina, MD   50 mg  at 02/04/24 9071   ranolazine  (RANEXA ) 12 hr tablet 500 mg  500 mg Oral BID Sundil, Subrina, MD   500 mg at 02/04/24 9070   sodium chloride  flush (NS) 0.9 % injection 3 mL  3 mL Intravenous Q12H Sundil, Subrina, MD   3 mL at 02/04/24 9072   sodium chloride  flush (NS) 0.9 % injection 3 mL  3 mL Intravenous Q12H Sundil, Subrina, MD   3 mL at 02/04/24 9072   sodium chloride  flush (NS) 0.9 % injection 3 mL  3 mL Intravenous PRN Sundil, Subrina, MD       traMADol  (ULTRAM ) tablet 50 mg  50 mg Oral Q6H PRN Cox, Amy N, DO   50 mg at 02/04/24 1137  Discharge Medications: Please see discharge summary for a list of discharge medications.  Relevant Imaging Results:  Relevant Lab Results:   Additional Information [id number]  Henson Fraticelli A Jevan Gaunt, LCSW

## 2024-02-05 DIAGNOSIS — I96 Gangrene, not elsewhere classified: Secondary | ICD-10-CM | POA: Diagnosis not present

## 2024-02-05 LAB — CBC WITH DIFFERENTIAL/PLATELET
Abs Immature Granulocytes: 0.05 K/uL (ref 0.00–0.07)
Basophils Absolute: 0 K/uL (ref 0.0–0.1)
Basophils Relative: 0 %
Eosinophils Absolute: 0.2 K/uL (ref 0.0–0.5)
Eosinophils Relative: 2 %
HCT: 36.3 % (ref 36.0–46.0)
Hemoglobin: 12.1 g/dL (ref 12.0–15.0)
Immature Granulocytes: 1 %
Lymphocytes Relative: 15 %
Lymphs Abs: 1.3 K/uL (ref 0.7–4.0)
MCH: 33.4 pg (ref 26.0–34.0)
MCHC: 33.3 g/dL (ref 30.0–36.0)
MCV: 100.3 fL — ABNORMAL HIGH (ref 80.0–100.0)
Monocytes Absolute: 0.7 K/uL (ref 0.1–1.0)
Monocytes Relative: 9 %
Neutro Abs: 6.3 K/uL (ref 1.7–7.7)
Neutrophils Relative %: 73 %
Platelets: 469 K/uL — ABNORMAL HIGH (ref 150–400)
RBC: 3.62 MIL/uL — ABNORMAL LOW (ref 3.87–5.11)
RDW: 12.8 % (ref 11.5–15.5)
WBC: 8.6 K/uL (ref 4.0–10.5)
nRBC: 0 % (ref 0.0–0.2)

## 2024-02-05 LAB — GLUCOSE, CAPILLARY
Glucose-Capillary: 116 mg/dL — ABNORMAL HIGH (ref 70–99)
Glucose-Capillary: 104 mg/dL — ABNORMAL HIGH (ref 70–99)
Glucose-Capillary: 162 mg/dL — ABNORMAL HIGH (ref 70–99)
Glucose-Capillary: 117 mg/dL — ABNORMAL HIGH (ref 70–99)
Glucose-Capillary: 198 mg/dL — ABNORMAL HIGH (ref 70–99)

## 2024-02-05 NOTE — Progress Notes (Signed)
 PROGRESS NOTE    Brandy Hall  FMW:[id number] DOB: 1961-02-28 DOA: 01/29/2024 PCP: Marelyn Quill, MD   Brief Narrative:  Brandy Hall is a 63 year old female with history of CAD, PAD status post bilateral common femoral iliac stenting on 01/23/2024, COPD, hypertension, CKD2, ongoing tobacco smoking, hyperlipidemia, who presents emergency department for chief concerns of right foot wound at the advice of podiatry.  Assessment & Plan:   Principal Problem:   Gangrene of toe of right foot (HCC) Active Problems:   Acute kidney injury superimposed on chronic kidney disease   Peripheral artery disease s/p right common Ilac stent placement 11/18   Essential hypertension   Insulin  dependent type 2 diabetes mellitus (HCC)   COPD (chronic obstructive pulmonary disease) (HCC)   Hyperlipidemia   History of CAD (coronary artery disease)   Continuous dependence on cigarette smoking  Sepsis secondary to gangrene of toe of right foot (HCC), resolving Complicated by ongoing tobacco use Status post amputation 01/31/2024 with podiatry, tolerated well Patient okay to continue dual antiplatelet therapy (asa/plavix ) per podiatry Continue Augmentin  for 5-day course per podiatry Pain management per podiatry -moderately well-controlled now that nerve block has worn off -pain still limits ambulation   Ambulatory dysfunction, acute  - Secondary to above, was hopeful for recovery postoperatively but continues to require 2+ assist with any exertion or ambulation.  Given patient's instability unclear if patient is safe to discharge home, case management/TOC consulted for further discussion in regards to SNF placement  Hospital delirium/sundowning, resolving Rule out polypharmacy - Patient much more awake alert and appropriate this morning, unfortunately no family at bedside to confirm approaching baseline - Polypharmacy is certainly a risk factor given recent anesthesia and narcotics - Continue  sleep wake cycle, supportive care, request family to be available at bedside if at all possible to help patient orient  Peripheral artery disease s/p right common Ilac stent placement 11/18 On aspirin  and Plavix  - ok to continue peri-operatively per podiatry   Acute kidney injury superimposed on chronic kidney disease 2 11/25: Resolved -back to baseline   Continuous dependence on cigarette smoking Counseling at bedside regarding the benefits of tobacco cessation in setting of PAD and gangrene of the right foot.   History of CAD (coronary artery disease) Aspirin  81 mg, Plavix  75 mg daily   Hyperlipidemia Ezetimibe  10 mg   COPD (chronic obstructive pulmonary disease) (HCC) Patient is not in acute exacerbation Long-acting inhaler/maintenance inhaler were resumed DuoNebs every 6 hours as needed for wheezing and shortness of breath   Insulin  dependent type 2 diabetes mellitus uncontrolled with hyperglycemia - A1c 8.7 - Long-acting insulin  halved to 12 units daily given previous episodes of hypoglycemia -currently well-controlled   Essential hypertension Amlodipine  5 mg daily, metoprolol  tartrate 25 mg p.o. twice daily  DVT prophylaxis: SCDs Start: 01/29/24 1925 Place TED hose Start: 01/29/24 1925   Code Status:   Code Status: Full Code  Family Communication: Daughter at bedside  Status is: Inpatient  Dispo: The patient is from: Home              Anticipated d/c is to: SNF              Anticipated d/c date is: Pending safe disposition              Patient currently is medically stable for discharge  Consultants:  Podiatry  Procedures:  Right transmetatarsal foot amputation 01/31/2024  Antimicrobials:  Perioperatively  Subjective: No acute issues or events overnight denies nausea  vomiting diarrhea constipation high fevers chills or chest pain  Objective: Vitals:   02/04/24 1555 02/04/24 2033 02/05/24 0401 02/05/24 0739  BP: (!) 109/52  109/75   Pulse: 76 80 74    Resp: 18 20 18    Temp: 98.1 F (36.7 C)  98.7 F (37.1 C)   TempSrc:   Oral   SpO2:  95% 97% 98%    Intake/Output Summary (Last 24 hours) at 02/05/2024 9257 Last data filed at 02/04/2024 1224 Gross per 24 hour  Intake 240 ml  Output 200 ml  Net 40 ml   There were no vitals filed for this visit.  Examination:  General:  Pleasantly resting in bed, No acute distress. HEENT:  Normocephalic atraumatic.  Sclerae nonicteric, noninjected.  Extraocular movements intact bilaterally. Neck:  Without mass or deformity.  Trachea is midline. Lungs:  Clear to auscultate bilaterally without rhonchi, wheeze, or rales. Heart:  Regular rate and rhythm.  Without murmurs, rubs, or gallops. Abdomen:  Soft, nontender, nondistended.  Without guarding or rebound. Extremities: Left foot bandage clean dry intact Skin:  Warm and dry, no erythema.  Data Reviewed: I have personally reviewed following labs and imaging studies  CBC: Recent Labs  Lab 01/29/24 1524 01/30/24 0303 01/31/24 0435 02/01/24 0406 02/02/24 0414  WBC 8.4 7.6 7.4 7.3 10.6*  NEUTROABS 5.8  --   --   --   --   HGB 11.7* 11.4* 10.7* 10.5* 11.4*  HCT 35.9* 35.3* 32.8* 31.9* 33.4*  MCV 102.9* 105.4* 102.5* 102.2* 98.8  PLT 292 270 319 318 338   Basic Metabolic Panel: Recent Labs  Lab 01/29/24 1524 01/30/24 0303 01/31/24 0435 02/01/24 0406 02/02/24 0414  NA 132* 132* 136 132* 130*  K 3.9 4.1 3.3* 4.0 3.9  CL 94* 99 101 100 95*  CO2 26 20* 25 24 26   GLUCOSE 143* 138* 56* 95 57*  BUN 6* 6* 6* 5* 6*  CREATININE 1.22* 0.97 1.13* 0.99 1.04*  CALCIUM 8.8* 8.7* 8.6* 8.8* 9.0   GFR: Estimated Creatinine Clearance: 59.9 mL/min (A) (by C-G formula based on SCr of 1.04 mg/dL (H)). Liver Function Tests: Recent Labs  Lab 01/29/24 1524 01/30/24 0303  AST <10* 10*  ALT 7 7  ALKPHOS 68 65  BILITOT 0.5 0.9  PROT 7.1 6.6  ALBUMIN 2.7* 2.5*   CBG: Recent Labs  Lab 02/04/24 0839 02/04/24 1221 02/04/24 1714 02/04/24 2030  02/05/24 0613  GLUCAP 85 172* 141* 148* 116*   Sepsis Labs: Recent Labs  Lab 01/29/24 1532  LATICACIDVEN 0.7    Recent Results (from the past 240 hours)  Blood culture (routine x 2)     Status: None   Collection Time: 01/29/24  5:38 PM   Specimen: BLOOD  Result Value Ref Range Status   Specimen Description BLOOD SITE NOT SPECIFIED  Final   Special Requests   Final    BOTTLES DRAWN AEROBIC AND ANAEROBIC Blood Culture results may not be optimal due to an inadequate volume of blood received in culture bottles   Culture   Final    NO GROWTH 5 DAYS Performed at [facility], [address], [place], [place] [address]    Report Status 02/03/2024 FINAL  Final  Blood culture (routine x 2)     Status: None   Collection Time: 01/29/24  6:00 PM   Specimen: BLOOD RIGHT HAND  Result Value Ref Range Status   Specimen Description BLOOD RIGHT HAND  Final   Special Requests  Final    BOTTLES DRAWN AEROBIC AND ANAEROBIC Blood Culture results may not be optimal due to an inadequate volume of blood received in culture bottles   Culture   Final    NO GROWTH 5 DAYS Performed at [facility], [address] [place], [place] [address]    Report Status 02/03/2024 FINAL  Final         Radiology Studies: No results found.       Scheduled Meds:  amLODipine   5 mg Oral Daily   aspirin   81 mg Oral Daily   budesonide -glycopyrrolate -formoterol   2 puff Inhalation BID   clopidogrel   75 mg Oral Daily   docusate sodium   100 mg Oral BID   ezetimibe   10 mg Oral Daily   feeding supplement  237 mL Oral BID BM   insulin  aspart  0-5 Units Subcutaneous QHS   insulin  aspart  0-9 Units Subcutaneous TID WC   insulin  glargine-yfgn  12 Units Subcutaneous Daily   metoprolol  tartrate  25 mg Oral BID   montelukast   10 mg Oral QHS   multivitamin with minerals  1 tablet Oral Daily   pantoprazole   40 mg Oral BID AC   pregabalin   50 mg Oral TID   ranolazine   500 mg Oral BID   sodium chloride   flush  3 mL Intravenous Q12H   sodium chloride  flush  3 mL Intravenous Q12H   Continuous Infusions:     LOS: 7 days   Time spent:  Elsie JAYSON [place], DO [facility]  If 7PM-7AM, please contact night-coverage [email or web address]  02/05/2024, 7:42 AM

## 2024-02-05 NOTE — Plan of Care (Signed)
  Problem: Coping: Goal: Ability to adjust to condition or change in health will improve Outcome: Progressing   Problem: Skin Integrity: Goal: Risk for impaired skin integrity will decrease Outcome: Progressing   Problem: Nutrition: Goal: Adequate nutrition will be maintained Outcome: Progressing

## 2024-02-05 NOTE — Plan of Care (Signed)
  Problem: Nutritional: Goal: Maintenance of adequate nutrition will improve Outcome: Progressing Goal: Progress toward achieving an optimal weight will improve Outcome: Progressing   Problem: Skin Integrity: Goal: Risk for impaired skin integrity will decrease Outcome: Progressing   Problem: Tissue Perfusion: Goal: Adequacy of tissue perfusion will improve Outcome: Progressing   Problem: Clinical Measurements: Goal: Will remain free from infection Outcome: Progressing

## 2024-02-05 NOTE — Progress Notes (Signed)
 Occupational Therapy Treatment Patient Details Name: AYANNI TUN MRN: [id number] DOB: 10/23/1960 Today's Date: 02/05/2024   History of present illness 63 y.o. female presents to [facility] hospital on 01/29/2024 with R foot wound. Pt underwent R foot TMA on 11/26. PMH includes CAD, PAD, COPD, HTN, HLD.   OT comments  Pt standing at closet with RW upon entry, fully weight bearing on R LE. Therapist provided recliner and assisted pt to sit with min assist.  Attempted transfers with RW into standing but unable to maintain NWB to R LE even with physical support to R LE; squat pivot with min assist to/from [facility] with assist support to RLE and max cueing for sequencing/technique to optimize adherence to precautions (but still not NWB).  Pt needing setup for UB ADLs, total assist for LB. Reinforced importance of following precautions, and pt able to recall/understand at end of session.  Agreeable to rehab, continue to recommend <3hrs/day inpatient setting at dc.       If plan is discharge home, recommend the following:  Direct supervision/assist for medications management;Direct supervision/assist for financial management;Assist for transportation;Help with stairs or ramp for entrance;A lot of help with bathing/dressing/bathroom;A lot of help with walking and/or transfers;Assistance with cooking/housework   Equipment Recommendations  BSC/3in1 (drop arm)    Recommendations for Other Services Speech consult (cog)    Precautions / Restrictions Precautions Precautions: Fall Recall of Precautions/Restrictions: Impaired Precaution/Restrictions Comments: no recall of WB restrictions initally Required Braces or Orthoses: Other Brace Other Brace: post-op shoe Restrictions Weight Bearing Restrictions Per Provider Order: Yes RLE Weight Bearing Per Provider Order: Non weight bearing       Mobility Bed Mobility               General bed mobility comments: OOB upon entry    Transfers Overall  transfer level: Needs assistance Equipment used: Rolling walker (2 wheels), 1 person hand held assist Transfers: Sit to/from Stand, Bed to chair/wheelchair/BSC Sit to Stand: Min assist, +2 safety/equipment Stand pivot transfers: Min assist, +2 physical assistance Squat pivot transfers: Min assist, +2 safety/equipment       General transfer comment: pt unable to stand without putting weight on R LE, requires 2nd person to keep R LE extended but pt continues to flex R LE and place weight through LE. best ability with squat pivot but continues to bear weight.  Poor ability to sequence and problem solve.;     Balance Overall balance assessment: Needs assistance Sitting-balance support: No upper extremity supported, Feet supported Sitting balance-Leahy Scale: Fair     Standing balance support: Bilateral upper extremity supported, During functional activity Standing balance-Leahy Scale: Poor Standing balance comment: unable to maintain WB restrictions                           ADL either performed or assessed with clinical judgement   ADL Overall ADL's : Needs assistance/impaired     Grooming: Set up;Sitting           Upper Body Dressing : Sitting;Set up   Lower Body Dressing: Total assistance;Sitting/lateral leans;Sit to/from stand   Toilet Transfer: Minimal assistance Statistician Details (indicate cue type and reason): to/from [facility], but pt not adhering to NWB to R LE         Functional mobility during ADLs: Minimal assistance;Rolling walker (2 wheels);Cueing for sequencing;Cueing for safety General ADL Comments: pt standing at closet upon entry. with RW.;  She requires min assist to safely  transition to sitting in recliner.  no recall of NWB precautions upon entry, but able to verbalize upon exit.;    Extremity/Trunk Assessment              Vision       Perception     Praxis     Communication Communication Communication: No apparent difficulties    Cognition Arousal: Alert Behavior During Therapy: Flat affect Cognition: Cognition impaired     Awareness: Online awareness impaired   Attention impairment (select first level of impairment): Sustained attention Executive functioning impairment (select all impairments): Initiation, Organization, Sequencing, Problem solving, Reasoning OT - Cognition Comments: pt standing at closet upon entry, weightbearing on R LE. poor awareness of safety and problem solving.;  Pt requires cueing for sequencing and safety, poor carryover of techniques                 Following commands: Impaired Following commands impaired: Follows one step commands with increased time, Follows multi-step commands inconsistently      Cueing   Cueing Techniques: Verbal cues, Visual cues, Tactile cues, Gestural cues  Exercises      Shoulder Instructions       General Comments      Pertinent Vitals/ Pain       Pain Assessment Pain Assessment: Faces Faces Pain Scale: No hurt Pain Intervention(s): Monitored during session, Repositioned  Home Living                                          Prior Functioning/Environment              Frequency  Min 2X/week        Progress Toward Goals  OT Goals(current goals can now be found in the care plan section)  Progress towards OT goals: Progressing toward goals  Acute Rehab OT Goals Patient Stated Goal: SNF OT Goal Formulation: With patient Time For Goal Achievement: 02/16/24 Potential to Achieve Goals: Fair  Plan      Co-evaluation                 AM-PAC OT 6 Clicks Daily Activity     Outcome Measure   Help from another person eating meals?: A Little Help from another person taking care of personal grooming?: A Little Help from another person toileting, which includes using toliet, bedpan, or urinal?: Total Help from another person bathing (including washing, rinsing, drying)?: A Lot Help from another person  to put on and taking off regular upper body clothing?: A Little Help from another person to put on and taking off regular lower body clothing?: Total 6 Click Score: 13    End of Session Equipment Utilized During Treatment: Rolling walker (2 wheels)  OT Visit Diagnosis: Other abnormalities of gait and mobility (R26.89);Muscle weakness (generalized) (M62.81);Pain;Other symptoms and signs involving cognitive function Pain - Right/Left: Right Pain - part of body: Ankle and joints of foot   Activity Tolerance Patient tolerated treatment well   Patient Left with call bell/phone within reach;in chair;with chair alarm set   Nurse Communication Mobility status;Precautions        Time: 8957-8894 OT Time Calculation (min): 23 min  Charges: OT General Charges $OT Visit: 1 Visit OT Treatments $Self Care/Home Management : 8-22 mins  Etta NOVAK, OT Acute Rehabilitation Services Office [phone] Secure Chat Preferred    Etta GORMAN Hope 02/05/2024, 11:45 AM

## 2024-02-05 NOTE — Progress Notes (Signed)
 Physical Therapy Treatment Patient Details Name: Brandy Hall MRN: [id number] DOB: Dec 31, 1960 Today's Date: 02/05/2024   History of Present Illness 63 y.o. female presents to [facility] hospital on 01/29/2024 with R foot wound. Pt underwent R foot TMA on 11/26. PMH includes CAD, PAD, COPD, HTN, HLD.    PT Comments  Pt received first by OT who found pt standing in room with wt on RLE despite being educated repeatedly in NWB status. Further education given to pt and pt expresses that she wants to maintain independence. Stressed to her that right now she has to prioritize doctor's WB orders over independence until she heals. Pt verbalizes understanding of this but have low confidence in carryover. Practiced standing with RW and pt needed pt to hold RLE off floor to achieve NWB. Educated that since pt unable to hop on LLE and keep RLE NWB that she needs to be a surface to surface transfer level. Even with squat pivot transfer which we practiced several times, she isn't fully keeping wt off RLE but at least is minimizing time up on it. Patient will benefit from continued inpatient follow up therapy, <3 hours/day. Follow     If plan is discharge home, recommend the following: Two people to help with walking and/or transfers;Two people to help with bathing/dressing/bathroom;Assistance with cooking/housework;Direct supervision/assist for medications management;Direct supervision/assist for financial management;Assist for transportation;Help with stairs or ramp for entrance;Supervision due to cognitive status   Can travel by private vehicle     No  Equipment Recommendations  Wheelchair (measurements PT);Wheelchair cushion (measurements PT);Hospital bed;Hoyer lift    Recommendations for Other Services       Precautions / Restrictions Precautions Precautions: Fall Recall of Precautions/Restrictions: Impaired Precaution/Restrictions Comments: no recall of WB restrictions initally Required Braces or  Orthoses: Other Brace Other Brace: post-op shoe Restrictions Weight Bearing Restrictions Per Provider Order: Yes RLE Weight Bearing Per Provider Order: Non weight bearing     Mobility  Bed Mobility               General bed mobility comments: pt was up in room, standing with wt on RLE, when OT entered    Transfers Overall transfer level: Needs assistance Equipment used: Rolling walker (2 wheels), None Transfers: Sit to/from Stand, Bed to chair/wheelchair/BSC Sit to Stand: Min assist, +2 safety/equipment Stand pivot transfers: Min assist, +2 physical assistance   Squat pivot transfers: Min assist, +2 safety/equipment     General transfer comment: pt unable to stand without putting weight on R LE, requires 2nd person to keep R LE extended but pt continues to flex R LE and place weight through LE. best ability with squat pivot but continues to bear weight.  Poor ability to sequence and problem solve.;    Ambulation/Gait               General Gait Details: pt cannot ambulate and keep RLE NWB   Stairs             Wheelchair Mobility     Tilt Bed    Modified Rankin (Stroke Patients Only)       Balance Overall balance assessment: Needs assistance Sitting-balance support: No upper extremity supported, Feet supported Sitting balance-Leahy Scale: Fair     Standing balance support: Bilateral upper extremity supported, During functional activity Standing balance-Leahy Scale: Poor Standing balance comment: unable to maintain WB restrictions  Communication Communication Communication: No apparent difficulties Factors Affecting Communication: Difficulty expressing self  Cognition Arousal: Alert Behavior During Therapy: Flat affect   PT - Cognitive impairments: Awareness, Memory, Orientation, Initiation, Attention, Sequencing, Problem solving, Safety/Judgement   Orientation impairments: Situation, Time                    PT - Cognition Comments: pt does not recall that she is NWB RLE and does not remember providers reviewing with her. Was able to verbalize by end of session but have no confidence that this will carryover Following commands: Impaired Following commands impaired: Follows one step commands with increased time, Follows multi-step commands inconsistently    Cueing Cueing Techniques: Verbal cues, Visual cues, Tactile cues, Gestural cues  Exercises General Exercises - Lower Extremity Ankle Circles/Pumps: AROM, Both, 20 reps, Seated    General Comments General comments (skin integrity, edema, etc.): VSS      Pertinent Vitals/Pain Pain Assessment Pain Assessment: Faces Faces Pain Scale: No hurt    Home Living                          Prior Function            PT Goals (current goals can now be found in the care plan section) Acute Rehab PT Goals Patient Stated Goal: to improve mobility quality, let R foot heal PT Goal Formulation: With patient Time For Goal Achievement: 02/15/24 Potential to Achieve Goals: Poor Progress towards PT goals: Not progressing toward goals - comment (unable to maintain WB status)    Frequency    Min 2X/week      PT Plan      Co-evaluation PT/OT/SLP Co-Evaluation/Treatment: Yes Reason for Co-Treatment: Necessary to address cognition/behavior during functional activity;For patient/therapist safety PT goals addressed during session: Mobility/safety with mobility;Balance;Proper use of DME;Strengthening/ROM        AM-PAC PT 6 Clicks Mobility   Outcome Measure  Help needed turning from your back to your side while in a flat bed without using bedrails?: A Little Help needed moving from lying on your back to sitting on the side of a flat bed without using bedrails?: A Little Help needed moving to and from a bed to a chair (including a wheelchair)?: Total Help needed standing up from a chair using your arms (e.g., wheelchair  or bedside chair)?: Total Help needed to walk in hospital room?: Total Help needed climbing 3-5 steps with a railing? : Total 6 Click Score: 10    End of Session Equipment Utilized During Treatment: Gait belt Activity Tolerance: Patient tolerated treatment well Patient left: with call bell/phone within reach;in chair;with chair alarm set Nurse Communication: Mobility status PT Visit Diagnosis: Other abnormalities of gait and mobility (R26.89);Muscle weakness (generalized) (M62.81);Pain     Time: 8954-8890 PT Time Calculation (min) (ACUTE ONLY): 24 min  Charges:    $Therapeutic Activity: 8-22 mins PT General Charges $$ ACUTE PT VISIT: 1 Visit                     Richerd Lipoma, PT  Acute Rehab Services Secure chat preferred Office [phone]    Richerd CROME [place] 02/05/2024, 12:50 PM

## 2024-02-06 DIAGNOSIS — I96 Gangrene, not elsewhere classified: Secondary | ICD-10-CM | POA: Diagnosis not present

## 2024-02-06 LAB — GLUCOSE, CAPILLARY
Glucose-Capillary: 150 mg/dL — ABNORMAL HIGH (ref 70–99)
Glucose-Capillary: 137 mg/dL — ABNORMAL HIGH (ref 70–99)
Glucose-Capillary: 120 mg/dL — ABNORMAL HIGH (ref 70–99)

## 2024-02-06 MED ORDER — LANTUS SOLOSTAR 100 UNIT/ML ~~LOC~~ SOPN: 12.0000 [IU] | PEN_INJECTOR | Freq: Every day | SUBCUTANEOUS | 11 refills | Status: AC

## 2024-02-06 MED ORDER — PREGABALIN 50 MG PO CAPS: 50.0000 mg | ORAL_CAPSULE | Freq: Three times a day (TID) | ORAL | 0 refills | Status: AC

## 2024-02-06 MED ORDER — METHOCARBAMOL 500 MG PO TABS
500.0000 mg | ORAL_TABLET | Freq: Once | ORAL | Status: AC
  Administered 2024-02-06: 500 mg via ORAL
  Filled 2024-02-06: qty 1

## 2024-02-06 MED ORDER — TRAMADOL HCL 50 MG PO TABS: 50.0000 mg | ORAL_TABLET | Freq: Four times a day (QID) | ORAL | 0 refills | Status: AC | PRN

## 2024-02-06 MED ORDER — IPRATROPIUM-ALBUTEROL 0.5-2.5 (3) MG/3ML IN SOLN: 3.0000 mL | Freq: Four times a day (QID) | RESPIRATORY_TRACT | 0 refills | Status: AC | PRN

## 2024-02-06 NOTE — Discharge Summary (Signed)
 Physician Discharge Summary  Brandy Hall FMW:[id number] DOB: 1961-01-31 DOA: 01/29/2024  PCP: Marelyn Quill, MD  Admit date: 01/29/2024 Discharge date: 02/06/2024  Admitted From: Home Disposition:  SNF  Recommendations for Outpatient Follow-up:  Follow up with PCP in 1-2 weeks Follow up with podiatry as scheduled  Discharge Condition: Stable CODE STATUS: Full Diet recommendation: Low-salt low-fat low-carb diet  Brief/Interim Summary: [name] is a 63 year old female with history of CAD, PAD status post bilateral common femoral iliac stenting on 01/23/2024, COPD, hypertension, CKD2, ongoing tobacco smoking, hyperlipidemia, who presents emergency department for chief concerns of right foot wound at the advice of podiatry.  Patient admitted as above with gangrene of the right foot, evaluated by podiatry status post transmetatarsal amputation 01/31/2024.  Patient tolerated procedure quite well, last bandage change today 02/06/2024.  Unfortunately post operatively patient had ongoing ambulatory dysfunction in the setting of amputation pain and general weakness.  PT evaluated and recommending SNF placement at discharge.  At this time patient is otherwise medically stable and agreeable for discharge to SNF, continue medications as below, multiple blood pressure medications discontinued in the setting of normotension while holding these in the hospital setting.  Follow-up with PCP in 1 to 2 weeks for medication management as well as with podiatry as scheduled for ongoing wound management postop care.   Discharge Diagnoses:  Principal Problem:   Gangrene of toe of right foot (HCC) Active Problems:   Acute kidney injury superimposed on chronic kidney disease   Peripheral artery disease s/p right common Ilac stent placement 11/18   Essential hypertension   Insulin  dependent type 2 diabetes mellitus (HCC)   COPD (chronic obstructive pulmonary disease) (HCC)   Hyperlipidemia    History of CAD (coronary artery disease)   Continuous dependence on cigarette smoking  Sepsis secondary to gangrene of toe of right foot (HCC), resolved Complicated by ongoing tobacco use Status post amputation 01/31/2024 with podiatry, tolerated well Patient okay to continue dual antiplatelet therapy (asa/plavix ) per podiatry Completed 5 day Augmentin  course per podiatry Pain well-controlled on NSAIDs Plan for discharge to SNF as below given ongoing ambulatory dysfunction   Ambulatory dysfunction, acute  - Secondary to above, was hopeful for recovery postoperatively but continues to require assistance with any exertion or ambulation.  Given patient's instability the patient remains unsafe to discharge home, transition to SNF for ongoing physical therapy with ultimate plan to discharge home with family   Hospital delirium/sundowning, resolved Rule out polypharmacy - Patient now back to baseline, appears to be transient episode of delirium during hospitalization now resolved.   Peripheral artery disease s/p right common Ilac stent placement 11/18 On aspirin  and Plavix  - ok to continue peri-operatively per podiatry   Acute kidney injury superimposed on chronic kidney disease 2 Resolved, back to baseline   Continuous dependence on cigarette smoking Counseling at bedside regarding the benefits of tobacco cessation in setting of PAD and gangrene of the right foot.   History of CAD (coronary artery disease) Aspirin  81 mg, Plavix  75 mg daily   Hyperlipidemia Ezetimibe  10 mg   COPD (chronic obstructive pulmonary disease) (HCC) Not in acute exacerbation Long-acting inhaler/maintenance inhaler continued DuoNebs every 6 hours as needed for wheezing and shortness of breath   Insulin  dependent type 2 diabetes mellitus uncontrolled with hyperglycemia - likely uncontrolled due to dietary noncompliance - A1c 8.7 - Long-acting insulin  halved to 12 units daily given previous episodes of  hypoglycemia -currently well-controlled on diabetic diet.   Essential hypertension Amlodipine  5  mg daily, metoprolol  tartrate 25 mg p.o. twice daily  Discharge Instructions  Discharge Instructions     Call MD for:  difficulty breathing, headache or visual disturbances   Complete by: As directed    Call MD for:  extreme fatigue   Complete by: As directed    Call MD for:  hives   Complete by: As directed    Call MD for:  persistant dizziness or light-headedness   Complete by: As directed    Call MD for:  persistant nausea and vomiting   Complete by: As directed    Call MD for:  redness, tenderness, or signs of infection (pain, swelling, redness, odor or green/yellow discharge around incision site)   Complete by: As directed    Call MD for:  severe uncontrolled pain   Complete by: As directed    Call MD for:  temperature >100.4   Complete by: As directed    Diet - low sodium heart healthy   Complete by: As directed    Discharge wound care:   Complete by: As directed    Leave dressing/bandage intact until seen at podiatry office. If soiled/issues please call podiatry office for instructions on how to proceed.   Increase activity slowly   Complete by: As directed    No dressing needed   Complete by: As directed       Allergies as of 02/06/2024       Reactions   Crestor [rosuvastatin] Other (See Comments)   Myalgias    Percocet [oxycodone -acetaminophen ] Hives   Statins Other (See Comments)   Per patient shuts down organs   Trulicity [dulaglutide] Nausea And Vomiting, Nausea Only, Other (See Comments)   Pancreatitis         Medication List     STOP taking these medications    amoxicillin -clavulanate 875-125 MG tablet Commonly known as: AUGMENTIN    isosorbide  mononitrate 30 MG 24 hr tablet Commonly known as: IMDUR    lisinopril  5 MG tablet Commonly known as: ZESTRIL        TAKE these medications    albuterol  108 (90 Base) MCG/ACT inhaler Commonly known  as: VENTOLIN  HFA Inhale 2 puffs into the lungs every 6 (six) hours as needed for wheezing or shortness of breath.   amLODipine  5 MG tablet Commonly known as: NORVASC  Take 1 tablet (5 mg total) by mouth daily.   ASPIRIN  81 PO Take 81 mg by mouth daily.   clopidogrel  75 MG tablet Commonly known as: PLAVIX  Take 75 mg by mouth daily.   docusate sodium  100 MG capsule Commonly known as: COLACE Take 1 capsule (100 mg total) by mouth daily.   ezetimibe  10 MG tablet Commonly known as: ZETIA  Take 10 mg by mouth daily.   Insulin  Aspart FlexPen 100 UNIT/ML Commonly known as: NOVOLOG  Inject 0-10 Units into the skin with breakfast, with lunch, and with evening meal. Sliding Scale   ipratropium-albuterol  0.5-2.5 (3) MG/3ML Soln Commonly known as: DUONEB Take 3 mLs by nebulization every 6 (six) hours as needed.   Lantus  SoloStar 100 UNIT/ML Solostar Pen Generic drug: insulin  glargine Inject 12 Units into the skin daily. What changed: how much to take   metoprolol  tartrate 25 MG tablet Commonly known as: LOPRESSOR  Take 25 mg by mouth 2 (two) times daily.   montelukast  10 MG tablet Commonly known as: SINGULAIR  Take 1 tablet by mouth at bedtime.   multivitamin capsule Take 1 capsule by mouth daily.   nicotine  21 mg/24hr patch Commonly known as: NICODERM CQ  -  dosed in mg/24 hours Place 1 patch (21 mg total) onto the skin daily.   nicotine  polacrilex 2 MG gum Commonly known as: NICORETTE  Take 1 each (2 mg total) by mouth as needed for smoking cessation.   nitroGLYCERIN  0.4 MG SL tablet Commonly known as: NITROSTAT  Place 0.4 mg under the tongue every 5 (five) minutes as needed for chest pain.   pantoprazole  40 MG tablet Commonly known as: PROTONIX  Take 1 tablet (40 mg total) by mouth 2 (two) times daily before a meal.   potassium chloride  10 MEQ tablet Commonly known as: KLOR-CON  Take 10 mEq by mouth daily.   pregabalin  50 MG capsule Commonly known as: LYRICA  Take 1  capsule (50 mg total) by mouth 3 (three) times daily.   Ranexa  500 MG 12 hr tablet Generic drug: ranolazine  Take 500 mg by mouth 2 (two) times daily.   traMADol  50 MG tablet Commonly known as: ULTRAM  Take 1 tablet (50 mg total) by mouth every 6 (six) hours as needed for up to 7 days for severe pain (pain score 7-10) or moderate pain (pain score 4-6).   Trelegy Ellipta 200-62.5-25 MCG/ACT Aepb Generic drug: Fluticasone-Umeclidin-Vilant Inhale 1 puff into the lungs daily in the afternoon.   VITAMIN D -3 PO Take 1 tablet by mouth daily.               Discharge Care Instructions  (From admission, onward)           Start     Ordered   02/06/24 0000  Discharge wound care:       Comments: Leave dressing/bandage intact until seen at podiatry office. If soiled/issues please call podiatry office for instructions on how to proceed.   02/06/24 1000   02/06/24 0000  No dressing needed        02/06/24 1000            Allergies  Allergen Reactions   Crestor [Rosuvastatin] Other (See Comments)    Myalgias    Percocet [Oxycodone -Acetaminophen ] Hives   Statins Other (See Comments)    Per patient shuts down organs   Trulicity [Dulaglutide] Nausea And Vomiting, Nausea Only and Other (See Comments)    Pancreatitis     Consultations: Podiatry  Procedures/Studies: DG Foot 2 Views Right Result Date: 01/31/2024 EXAM: 1 OR 2 VIEW(S) XRAY OF THE RIGHT FOOT 01/31/2024 09:50:19 AM COMPARISON: 01/29/2024. CLINICAL HISTORY: 747648 Post-operative state 252351 Post-operative state FINDINGS: BONES AND JOINTS: Interval changes of transmetatarsal amputation of all 5 right toes. No complicating feature. No acute fracture. SOFT TISSUES: The soft tissues are unremarkable. IMPRESSION: 1. Interval transmetatarsal amputation of all five right toes without complicating feature. Electronically signed by: Franky Crease MD 01/31/2024 08:02 PM EST RP Workstation: [id number]   VAS US  ABI WITH/WO  TBI Result Date: 01/30/2024  LOWER EXTREMITY DOPPLER STUDY Patient Name:  Brandy Hall  Date of Exam:   01/30/2024 Medical Rec #: [id number]         Accession #:    [id number] Date of Birth: 1960/04/02         Patient Gender: F Patient Age:   46 years Exam Location:  [facility] Procedure:      VAS US  ABI WITH/WO TBI Referring Phys: SUBRINA SUNDIL --------------------------------------------------------------------------------  Indications: Peripheral artery disease. High Risk Factors: Hypertension, hyperlipidemia, Diabetes, current smoker.  Vascular Interventions: 01/23/2024 - INSERTION OF BILATERAL ILIAC STENT, ARTERY  ULTRASOUND GUIDANCE, FOR VASCULAR ACCESS, BILATERAL                         FEMORAL ARTERIES. Comparison Study: 01/19/2024 - Right: Resting right ankle-brachial index                   indicates severe right lower                   extremity arterial disease.                    Left: Resting left ankle-brachial index indicates mild left                   lower                   extremity arterial disease. The left toe-brachial index is                   normal. Performing Technologist: Gerome Ny RVT  Examination Guidelines: A complete evaluation includes at minimum, Doppler waveform signals and systolic blood pressure reading at the level of bilateral brachial, anterior tibial, and posterior tibial arteries, when vessel segments are accessible. Bilateral testing is considered an integral part of a complete examination. Photoelectric Plethysmograph (PPG) waveforms and toe systolic pressure readings are included as required and additional duplex testing as needed. Limited examinations for reoccurring indications may be performed as noted.  ABI Findings: +---------+------------------+-----+-----------+--------+ Right    Rt Pressure (mmHg)IndexWaveform   Comment  +---------+------------------+-----+-----------+--------+ Brachial 131                     triphasic           +---------+------------------+-----+-----------+--------+ PTA      133               0.83 multiphasic         +---------+------------------+-----+-----------+--------+ DP       154               0.96 multiphasic         +---------+------------------+-----+-----------+--------+ Great Toe                       Absent              +---------+------------------+-----+-----------+--------+ +---------+------------------+-----+-----------+-------+ Left     Lt Pressure (mmHg)IndexWaveform   Comment +---------+------------------+-----+-----------+-------+ Brachial 161                    triphasic          +---------+------------------+-----+-----------+-------+ PTA      138               0.86 multiphasic        +---------+------------------+-----+-----------+-------+ DP       129               0.80 multiphasic        +---------+------------------+-----+-----------+-------+ Great Toe110               0.68                    +---------+------------------+-----+-----------+-------+ +-------+-----------+-----------+------------+------------+ ABI/TBIToday's ABIToday's TBIPrevious ABIPrevious TBI +-------+-----------+-----------+------------+------------+ Right  0.96       Absent                              +-------+-----------+-----------+------------+------------+  Left   0.86       0.68                                +-------+-----------+-----------+------------+------------+  Summary: Right: Resting right ankle-brachial index is within normal range.  Unable to obtain TBI due to absent waveforms. Left: Resting left ankle-brachial index indicates mild left lower extremity arterial disease. The left toe-brachial index is abnormal.  *See table(s) above for measurements and observations.  Electronically signed by Penne Colorado MD on 01/30/2024 at 3:59:48 PM.    Final    DG Foot Complete Right Result Date: 01/29/2024 EXAM: 3 OR MORE VIEW(S)  XRAY OF THE _LATERALITY_ FOOT 01/29/2024 05:31:00 PM COMPARISON: None available. CLINICAL HISTORY: Fever, concern for gangrene in the foot. Does have a dopplerable pulse after recent arterial stenting. FINDINGS: BONES AND JOINTS: No bony destructive or erosive findings characteristic of active osteomyelitis. No acute fracture. No focal osseous lesion. No joint dislocation. SOFT TISSUES: Substantial soft tissue swelling dorsally along the ankle and foot. Prominent soft tissue volume loss distally in the second toe, cannot exclude ulceration, correlate with visual inspection. IMPRESSION: 1. Substantial soft tissue swelling dorsally along the ankle and foot. 2. Soft tissue volume loss distally in the second toe with possible ulceration; correlate with point tenderness and visual inspection. 3. No bony destructive or erosive findings characteristic of active osteomyelitis. Electronically signed by: Ryan Salvage MD 01/29/2024 06:26 PM EST RP Workstation: [id number]   DG Foot Complete Right Result Date: 01/29/2024 Please see detailed radiograph report in office note.  VAS US  RENAL ARTERY DUPLEX Result Date: 01/24/2024 ABDOMINAL VISCERAL Patient Name:  Brandy Hall  Date of Exam:   01/24/2024 Medical Rec #: [id number]         Accession #:    [id number] Date of Birth: September 23, 1960         Patient Gender: F Patient Age:   21 years Exam Location:  [facility] Procedure:      VAS US  RENAL ARTERY DUPLEX Referring Phys: [id number] PENNE BRUCKNER CAIN -------------------------------------------------------------------------------- Indications: Stenosis High Risk Factors: Hypertension, hyperlipidemia, Diabetes, current smoker. Other Factors: COPD, PVD. Vascular Interventions: 01/23/24 -Status post distal aortic stenting with                         bilateral common iliac arteries stents. Limitations: Obesity and air/bowel gas. Performing Technologist: Ricka Sturdivant-Jones RDMS, RVT  Examination Guidelines:  A complete evaluation includes B-mode imaging, spectral Doppler, color Doppler, and power Doppler as needed of all accessible portions of each vessel. Bilateral testing is considered an integral part of a complete examination. Limited examinations for reoccurring indications may be performed as noted.  Duplex Findings: +--------------------+--------+--------+------+--------+ Mesenteric          PSV cm/sEDV cm/sPlaqueComments +--------------------+--------+--------+------+--------+ Aorta Mid              83                          +--------------------+--------+--------+------+--------+ Celiac Artery Origin  145                          +--------------------+--------+--------+------+--------+ SMA Proximal          257                          +--------------------+--------+--------+------+--------+    +------------------+--------+--------+-------+  Right Renal ArteryPSV cm/sEDV cm/sComment +------------------+--------+--------+-------+ Origin               79      22           +------------------+--------+--------+-------+ Proximal             71      23           +------------------+--------+--------+-------+ Mid                  58      18           +------------------+--------+--------+-------+ Distal               55      19           +------------------+--------+--------+-------+ +-----------------+--------+--------+-------+ Left Renal ArteryPSV cm/sEDV cm/sComment +-----------------+--------+--------+-------+ Origin              97      22           +-----------------+--------+--------+-------+ Proximal            82      22           +-----------------+--------+--------+-------+ Mid                 65      21           +-----------------+--------+--------+-------+ Distal              80      22           +-----------------+--------+--------+-------+ +------------+--------+--------+----+-----------+--------+--------+----+ Right  KidneyPSV cm/sEDV cm/sRI  Left KidneyPSV cm/sEDV cm/sRI   +------------+--------+--------+----+-----------+--------+--------+----+ Upper Pole  22      13      0.43Upper Pole 27      11      0.60 +------------+--------+--------+----+-----------+--------+--------+----+ Mid         21      13      0.        21      8       0.61 +------------+--------+--------+----+-----------+--------+--------+----+ Lower Pole  20      9       0.54Lower Pole 20      8       0.58 +------------+--------+--------+----+-----------+--------+--------+----+ Hilar       37      12      0.67Hilar      26      10      0.61 +------------+--------+--------+----+-----------+--------+--------+----+ +------------------+-----+------------------+-----+ Right Kidney           Left Kidney             +------------------+-----+------------------+-----+ RAR                    RAR                     +------------------+-----+------------------+-----+ RAR (manual)      1.0  RAR (manual)      1.1   +------------------+-----+------------------+-----+ Cortex                 Cortex                  +------------------+-----+------------------+-----+ Cortex thickness       Corex thickness         +------------------+-----+------------------+-----+ Kidney length (cm)11.39Kidney length (cm)10.68 +------------------+-----+------------------+-----+  Summary: Renal:  Right: No evidence of right renal artery stenosis. Normal  right        Resisitive Index. Normal size right kidney. Left:  No evidence of left renal artery stenosis. Normal left        Resistive Index. Normal size of left kidney. Cyst(s) noted.        Upper pole cyst measuring 3.5 x 2.7 x 3.5 cm.  *See table(s) above for measurements and observations.  Diagnosing physician: Lonni Gaskins MD  Electronically signed by Lonni Gaskins MD on 01/24/2024 at 10:54:22 AM.    Final    DG Chest Port 1 View Result Date:  01/23/2024 CLINICAL DATA:  Critical lower limb ischemia. EXAM: PORTABLE CHEST 1 VIEW COMPARISON:  Radiographs 01/18/2024 and 12/17/2023. Chest CTA 12/17/2023. FINDINGS: 1127 hours. Lordotic positioning. The heart size and mediastinal contours are stable with mild cardiac enlargement and aortic atherosclerosis. There is chronic atelectasis or scarring at both bases. No edema, confluent airspace disease, pneumothorax or significant pleural effusion. Mild degenerative changes in the spine without acute osseous abnormality. IMPRESSION: No evidence of acute cardiopulmonary process. Chronic bibasilar atelectasis or scarring. Electronically Signed   By: Elsie Perone M.D.   On: 01/23/2024 15:35   HYBRID OR IMAGING ([facility] ONLY) Result Date: 01/23/2024 There is no interpretation for this exam.  This order is for images obtained during a surgical procedure.  Please See Surgeries Tab for more information regarding the procedure.   CT ANGIO AO+BIFEM W & OR WO CONTRAST Result Date: 01/20/2024 CLINICAL DATA:  Atherosclerosis with rest pain EXAM: CT ANGIOGRAPHY OF ABDOMINAL AORTA WITH ILIOFEMORAL RUNOFF TECHNIQUE: Multidetector CT imaging of the abdomen, pelvis and lower extremities was performed using the standard protocol during bolus administration of intravenous contrast. Multiplanar CT image reconstructions and MIPs were obtained to evaluate the vascular anatomy. RADIATION DOSE REDUCTION: This exam was performed according to the departmental dose-optimization program which includes automated exposure control, adjustment of the mA and/or kV according to patient size and/or use of iterative reconstruction technique. CONTRAST:  OMNIPAQUE  IOHEXOL  350 MG/ML SOLN COMPARISON:  CTA of the chest abdomen and pelvis performed April 13, 2022 FINDINGS: VASCULAR Aorta: Fusiform aneurysm of the infrarenal segment measuring 3.1 x 3.0 cm with moderate wall adherent plaque/thrombus. Celiac: Patent. SMA: Patent. Renals:  Patent. IMA: Patent. RIGHT Lower Extremity Inflow: The right common iliac artery is occluded in the proximal segment. The occluded segment is dilated measuring 1.9 cm in largest dimension. The length of the occlusion is approximately 4 cm. The distal right common iliac artery is reconstituted. A moderate stenosis is present in the proximal right internal iliac artery. The right external iliac artery is ectatic and demonstrates mild diffuse disease. Outflow: Mild atherosclerotic changes secondary to fibrofatty and calcified plaque in the right common femoral artery. Multifocal mild disease is present within the right SFA. The right popliteal artery is patent. Runoff: Proximal origin of the right anterior tibial artery. There is a focal moderate stenosis estimated at 50% in the mid segment of the anterior tibial artery. The posterior tibial artery and peroneal arteries are patent. LEFT Lower Extremity Inflow: Focal mild-to-moderate stenosis is present the proximal left common iliac artery which is estimated at 50%. There is mild dilation of the distal left common iliac artery. A moderate stenosis is present in the proximal left internal iliac artery. A mild stenosis is present in the proximal left external iliac artery. The left external iliac artery is patent. Outflow: Left common femoral artery is patent. Mild atherosclerotic changes are present in the left SFA. Left popliteal artery is patent.  Runoff: Proximal origin of the left anterior tibial artery. A focal moderate stenosis is present in the distal posterior tibial artery. The peroneal artery is patent. Veins: No obvious venous abnormality within the limitations of this arterial phase study. Review of the MIP images confirms the above findings. NON-VASCULAR Lower chest: Nothing acute. Hepatobiliary: Postsurgical changes from cholecystectomy. Pancreas: Unremarkable. No pancreatic ductal dilatation or surrounding inflammatory changes. Spleen: Normal in size  without focal abnormality. Adrenals/Urinary Tract: The adrenal glands are within normal limits. There is no hydronephrosis. Small cysts are present in both kidneys. Largest in the right kidney measures 2.0 cm in the largest in the left kidney measures 3.6 cm. These are low in attenuation in favored to be simple. The ureters are grossly unremarkable. The bladder is within normal limits. Stomach/Bowel: No dilated loops of bowel are seen. Colonic diverticulosis. Lymphatic: No significant lymphadenopathy. Reproductive: Status post hysterectomy. No adnexal masses. Other: Nothing significant. Musculoskeletal: Degenerative changes are present in the lumbar spine. Chronic L1 compression deformity. IMPRESSION: 1. Abdominal aortic aneurysm measuring 3.1 cm. 2. Right lower extremity: Occlusion of the right common iliac artery measuring approximately 4 cm in length. Reconstitution of the distal right common iliac artery. Mild outflow disease. Focal moderate stenosis in the right anterior tibial artery. The peroneal and posterior tibial arteries are patent. 3. Left lower extremity: Mild to moderate disease is present in the proximal left common iliac artery. The left external iliac artery is patent. Patent outflow with mild disease. Focal moderate stenosis in the distal left posterior tibial artery. The anterior tibial and peroneal arteries are patent. Electronically Signed   By: Maude Naegeli M.D.   On: 01/20/2024 08:07   VAS US  ABI WITH/WO TBI Result Date: 01/19/2024  LOWER EXTREMITY DOPPLER STUDY Patient Name:  Brandy Hall  Date of Exam:   01/19/2024 Medical Rec #: [id number]         Accession #:    [id number] Date of Birth: [birth year]         Patient Gender: F Patient Age:   55 years Exam Location:  [facility] Procedure:      VAS US  ABI WITH/WO TBI Referring Phys: TERETHA DAMME --------------------------------------------------------------------------------  Indications: Peripheral artery disease. Present  with worsening erythema and pai              in her fight foot and worsening necrotic changes in the 2nd right              toe. High Risk Factors: Hypertension, hyperlipidemia, Diabetes, past history of                    smoking, coronary artery disease. Other Factors: History of bilateral common iliac arteries 80% stenosis on CTA on                09/06/23. No intervention performed.  Performing Technologist: Ricka Sturdivant-Jones RDMS, RVT  Examination Guidelines: A complete evaluation includes at minimum, Doppler waveform signals and systolic blood pressure reading at the level of bilateral brachial, anterior tibial, and posterior tibial arteries, when vessel segments are accessible. Bilateral testing is considered an integral part of a complete examination. Photoelectric Plethysmograph (PPG) waveforms and toe systolic pressure readings are included as required and additional duplex testing as needed. Limited examinations for reoccurring indications may be performed as noted.  ABI Findings: +---------+------------------+-----+----------+--------+ Right    Rt Pressure (mmHg)IndexWaveform  Comment  +---------+------------------+-----+----------+--------+ Brachial 133  triphasic          +---------+------------------+-----+----------+--------+ PTA      58                0.42 monophasic         +---------+------------------+-----+----------+--------+ DP       55                0.40 monophasic         +---------+------------------+-----+----------+--------+ Great Toe                       Absent             +---------+------------------+-----+----------+--------+ +---------+------------------+-----+---------+-------+ Left     Lt Pressure (mmHg)IndexWaveform Comment +---------+------------------+-----+---------+-------+ Brachial 137                    triphasic        +---------+------------------+-----+---------+-------+ PTA      120               0.88  biphasic         +---------+------------------+-----+---------+-------+ DP       115               0.84 biphasic         +---------+------------------+-----+---------+-------+ Great Toe96                0.70 Normal           +---------+------------------+-----+---------+-------+ +-------+-----------+-----------+------------+------------+ ABI/TBIToday's ABIToday's TBIPrevious ABIPrevious TBI +-------+-----------+-----------+------------+------------+ Right  0.42       absent     0.69        0.72         +-------+-----------+-----------+------------+------------+ Left   0.84       0.70       0.99        0.85         +-------+-----------+-----------+------------+------------+ Bilateral ABIs and TBIs appear decreased compared to prior study on 06/26/23.  Summary: Right: Resting right ankle-brachial index indicates severe right lower extremity arterial disease.  Left: Resting left ankle-brachial index indicates mild left lower extremity arterial disease. The left toe-brachial index is normal.  *See table(s) above for measurements and observations.  Electronically signed by Penne Colorado MD on 01/19/2024 at 6:03:38 PM.    Final    VAS US  LOWER EXTREMITY ARTERIAL DUPLEX Result Date: 01/19/2024 LOWER EXTREMITY ARTERIAL DUPLEX STUDY Patient Name:  Brandy Hall  Date of Exam:   01/19/2024 Medical Rec #: [id number]         Accession #:    [id number] Date of Birth: December 01, 1960         Patient Gender: F Patient Age:   41 years Exam Location:  [facility] Procedure:      VAS US  LOWER EXTREMITY ARTERIAL DUPLEX Referring Phys: TERETHA DAMME --------------------------------------------------------------------------------  Indications: Peripheral artery disease, and presenting with worsening erythema              and pain in her right foot and worsening necrotic changes in the              2nd toe. High Risk Factors: Hypertension, hyperlipidemia, Diabetes, past history of                     smoking, coronary artery disease. Other Factors: COPD, GERD                Bilateral CIA 80% stenosis on 09/06/23 arteriogram. No intervention  performed.  Current ABI: Right 0.42, Left 0.84 Performing Technologist: Ricka Sturdivant-Jones RDMS, RVT  Examination Guidelines: A complete evaluation includes B-mode imaging, spectral Doppler, color Doppler, and power Doppler as needed of all accessible portions of each vessel. Bilateral testing is considered an integral part of a complete examination. Limited examinations for reoccurring indications may be performed as noted.  +-----------+--------+-----+---------------+-------------------+--------+ RIGHT      PSV cm/sRatioStenosis       Waveform           Comments +-----------+--------+-----+---------------+-------------------+--------+ CIA Prox   360          75-99% stenosis                            +-----------+--------+-----+---------------+-------------------+--------+ EIA Mid    48                          monophasic                  +-----------+--------+-----+---------------+-------------------+--------+ CFA Mid    50                          monophasic                  +-----------+--------+-----+---------------+-------------------+--------+ DFA        25                          monophasic                  +-----------+--------+-----+---------------+-------------------+--------+ SFA Prox   40                          monophasic                  +-----------+--------+-----+---------------+-------------------+--------+ SFA Mid    48                          monophasic                  +-----------+--------+-----+---------------+-------------------+--------+ SFA Distal 31                          monophasic                  +-----------+--------+-----+---------------+-------------------+--------+ POP Prox   30                          monophasic                   +-----------+--------+-----+---------------+-------------------+--------+ POP Distal 29                          monophasic                  +-----------+--------+-----+---------------+-------------------+--------+ ATA Prox   27                          monophasic                  +-----------+--------+-----+---------------+-------------------+--------+ ATA Mid    20  monophasic                  +-----------+--------+-----+---------------+-------------------+--------+ ATA Distal 18                          monophasic                  +-----------+--------+-----+---------------+-------------------+--------+ PTA Prox   28                          monophasic                  +-----------+--------+-----+---------------+-------------------+--------+ PTA Mid    26                          monophasic                  +-----------+--------+-----+---------------+-------------------+--------+ PTA Distal 27                          monophasic                  +-----------+--------+-----+---------------+-------------------+--------+ PERO Mid   19                          monophasic                  +-----------+--------+-----+---------------+-------------------+--------+ PERO Distal19                          monophasic                  +-----------+--------+-----+---------------+-------------------+--------+ DP         14                          dampened monophasic         +-----------+--------+-----+---------------+-------------------+--------+  Summary: Right: 75-99% stenosis in the right common iliac artery. No significant stenosis noted distally.  See table(s) above for measurements and observations. Electronically signed by Penne Colorado MD on 01/19/2024 at 6:02:56 PM.    Final    DG Foot 2 Views Right Result Date: 01/19/2024 EXAM: 1 or 2 VIEW(S) XRAY OF THE RIGHT FOOT 01/19/2024 10:19:00 AM COMPARISON: Outside right foot  radiograph 01/18/2024. CLINICAL HISTORY: Gangrene (HCC) 10012. FINDINGS: BONES AND JOINTS: No acute fracture. No focal osseous lesion. No joint dislocation. Mild degenerative changes of the 1st MTP (metatarsophalangeal) joint. SOFT TISSUES: Prominent diffuse soft tissue swelling throughout the foot. IMPRESSION: 1. Prominent diffuse soft tissue swelling throughout the foot. No focal bony abnormality. Electronically signed by: Ryan Chess MD 01/19/2024 02:58 PM EST RP Workstation: [id number]     Subjective: No acute issues or events overnight pain well-controlled denies nausea vomiting diarrhea constipation headache fevers chills chest pain shortness of breath   Discharge Exam: Vitals:   02/06/24 0726 02/06/24 0811  BP:  (!) 140/93  Pulse:  82  Resp:  16  Temp:  98.1 F (36.7 C)  SpO2: 98% (!) 87%   Vitals:   02/05/24 2011 02/06/24 0352 02/06/24 0726 02/06/24 0811  BP: 119/75 134/76  (!) 140/93  Pulse: 76 77  82  Resp: 19 19  16   Temp: 97.9 F (36.6 C) 98.7 F (37.1 C)  98.1 F (36.7 C)  TempSrc: Oral  Oral    SpO2:  98% 98% (!) 87%    General: Pt is alert, awake, not in acute distress Cardiovascular: RRR, S1/S2 +, no rubs, no gallops Respiratory: CTA bilaterally, no wheezing, no rhonchi Abdominal: Soft, NT, ND, bowel sounds + Extremities: Right lower extremity bandage clean dry intact    The results of significant diagnostics from this hospitalization (including imaging, microbiology, ancillary and laboratory) are listed below for reference.     Microbiology: Recent Results (from the past 240 hours)  Blood culture (routine x 2)     Status: None   Collection Time: 01/29/24  5:38 PM   Specimen: BLOOD  Result Value Ref Range Status   Specimen Description BLOOD SITE NOT SPECIFIED  Final   Special Requests   Final    BOTTLES DRAWN AEROBIC AND ANAEROBIC Blood Culture results may not be optimal due to an inadequate volume of blood received in culture bottles   Culture    Final    NO GROWTH 5 DAYS Performed at [facility], [address], [place], [place] [address]    Report Status 02/03/2024 FINAL  Final  Blood culture (routine x 2)     Status: None   Collection Time: 01/29/24  6:00 PM   Specimen: BLOOD RIGHT HAND  Result Value Ref Range Status   Specimen Description BLOOD RIGHT HAND  Final   Special Requests   Final    BOTTLES DRAWN AEROBIC AND ANAEROBIC Blood Culture results may not be optimal due to an inadequate volume of blood received in culture bottles   Culture   Final    NO GROWTH 5 DAYS Performed at [facility], [address], [place], [place] [address]    Report Status 02/03/2024 FINAL  Final     Labs: BNP (last 3 results) No results for input(s): BNP in the last 8760 hours. Basic Metabolic Panel: Recent Labs  Lab 01/31/24 0435 02/01/24 0406 02/02/24 0414  NA 136 132* 130*  K 3.3* 4.0 3.9  CL 101 100 95*  CO2 25 24 26   GLUCOSE 56* 95 57*  BUN 6* 5* 6*  CREATININE 1.13* 0.99 1.04*  CALCIUM 8.6* 8.8* 9.0   CBC: Recent Labs  Lab 01/31/24 0435 02/01/24 0406 02/02/24 0414 02/05/24 1340  WBC 7.4 7.3 10.6* 8.6  NEUTROABS  --   --   --  6.3  HGB 10.7* 10.5* 11.4* 12.1  HCT 32.8* 31.9* 33.4* 36.3  MCV 102.5* 102.2* 98.8 100.3*  PLT 319 318 338 469*   CBG: Recent Labs  Lab 02/05/24 1240 02/05/24 1649 02/05/24 2132 02/06/24 0608 02/06/24 0810  GLUCAP 162* 117* 198* 150* 137*   Urinalysis    Component Value Date/Time   COLORURINE YELLOW 01/30/2024 0314   APPEARANCEUR CLEAR 01/30/2024 0314   LABSPEC 1.010 01/30/2024 0314   PHURINE 6.0 01/30/2024 0314   GLUCOSEU 150 (A) 01/30/2024 0314   HGBUR NEGATIVE 01/30/2024 0314   BILIRUBINUR NEGATIVE 01/30/2024 0314   KETONESUR NEGATIVE 01/30/2024 0314   PROTEINUR 30 (A) 01/30/2024 0314   NITRITE NEGATIVE 01/30/2024 0314   LEUKOCYTESUR NEGATIVE 01/30/2024 0314   Sepsis Labs Recent Labs  Lab 01/31/24 0435 02/01/24 0406 02/02/24 0414 02/05/24 1340   WBC 7.4 7.3 10.6* 8.6   Microbiology Recent Results (from the past 240 hours)  Blood culture (routine x 2)     Status: None   Collection Time: 01/29/24  5:38 PM   Specimen: BLOOD  Result Value Ref Range Status   Specimen  Description BLOOD SITE NOT SPECIFIED  Final   Special Requests   Final    BOTTLES DRAWN AEROBIC AND ANAEROBIC Blood Culture results may not be optimal due to an inadequate volume of blood received in culture bottles   Culture   Final    NO GROWTH 5 DAYS Performed at [facility], [address], [place], [place] [address]    Report Status 02/03/2024 FINAL  Final  Blood culture (routine x 2)     Status: None   Collection Time: 01/29/24  6:00 PM   Specimen: BLOOD RIGHT HAND  Result Value Ref Range Status   Specimen Description BLOOD RIGHT HAND  Final   Special Requests   Final    BOTTLES DRAWN AEROBIC AND ANAEROBIC Blood Culture results may not be optimal due to an inadequate volume of blood received in culture bottles   Culture   Final    NO GROWTH 5 DAYS Performed at [facility], [address], [place], [place] [address]    Report Status 02/03/2024 FINAL  Final     Time coordinating discharge: Over 30 minutes  SIGNED:   Elsie JAYSON [place], DO [facility] 02/06/2024, 10:01 AM Pager   If 7PM-7AM, please contact night-coverage [email or web address]

## 2024-02-06 NOTE — Progress Notes (Signed)
 PODIATRY PROGRESS NOTE Patient Name: Brandy Hall  DOB Nov 01, 1960 DOA 01/29/2024  Hospital Day: 24  Assessment:  63 y.o. female who is status post right foot transmetatarsal amputation with application of amniotic allograft (DOS 01/31/24).  She states that she is doing well in her postoperative recovery.  She does experience some pain to the surgical site.  No complications..  AF, VSS  WBC: 8.6, improved from 10.6   Pathology: FINAL MICROSCOPIC DIAGNOSIS:   A. FOOT, RIGHT FOREFOOT, AMPUTATION:  - Forefoot amputation specimen, clinically right, showing necrotizing  inflammation  - Bone at the resection margin does not show evidence of acute  osteomyelitis    Imaging: IMPRESSION: 1. Interval transmetatarsal amputation of all five right toes without complicating feature.  Plan:  -  6 days status post right foot TMA with application of graft, doing well - No sign of continued infection, hematoma or other complication to surgical site. - Dressing changed with betadine applied to central aspect of incision. DSD applied over top.  - Patient is stable for discharge from podiatric standpoint.  WBC has normalized.  -Recommend discharge on oral antibiotics - Follow-up at [facility] clinic in 1 week, currently scheduled for followup 02/08/24. If she is still in house she should reschedule to one week from discharge.  - Leave dressing clean, dry, intact         Prentice Ovens, DPM [facility]    Subjective:  64 year old female patient 6 days status post left transmetatarsal amputation with graft application.  She is doing well in her postoperative recovery.  She does admit to having some pain which is controlled utilizing current medications.  Objective:   Vitals:   02/06/24 0726 02/06/24 0811  BP:  (!) 140/93  Pulse:  82  Resp:  16  Temp:  98.1 F (36.7 C)  SpO2: 98% (!) 87%       Latest Ref Rng & Units 02/05/2024    1:40 PM 02/02/2024     4:14 AM 02/01/2024    4:06 AM  CBC  WBC 4.0 - 10.5 K/uL 8.6  10.6  7.3   Hemoglobin 12.0 - 15.0 g/dL 87.8  88.5  89.4   Hematocrit 36.0 - 46.0 % 36.3  33.4  31.9   Platelets 150 - 400 K/uL 469  338  318        Latest Ref Rng & Units 02/02/2024    4:14 AM 02/01/2024    4:06 AM 01/31/2024    4:35 AM  BMP  Glucose 70 - 99 mg/dL 57  95  56   BUN 8 - 23 mg/dL 6  5  6    Creatinine 0.44 - 1.00 mg/dL 8.95  9.00  8.86   Sodium 135 - 145 mmol/L 130  132  136   Potassium 3.5 - 5.1 mmol/L 3.9  4.0  3.3   Chloride 98 - 111 mmol/L 95  100  101   CO2 22 - 32 mmol/L 26  24  25    Calcium 8.9 - 10.3 mg/dL 9.0  8.8  8.6     General: AAOx3, NAD  Lower Extremity Exam Right lower extremity status post transmetatarsal amputation.  Staples are intact. Minor maceration to central aspect of incision.  Minor serosanguineous drainage.  No drainage with palpation of surgical site.  Minor erythema, edema consistent with postoperative state.  No sign of continued infection.  Incision edges and flaps appear viable at this time with adequate perfusion.  Radiology:  Results reviewed. See assessment for pertinent imaging results

## 2024-02-06 NOTE — TOC Progression Note (Signed)
 Transition of Care [facility]) - Progression Note    Patient Details  Name: CHEKESHA BEHLKE MRN: [id number] Date of Birth: 1960-08-29  Transition of Care [facility]) CM/SW Contact  Bridget Cordella Simmonds, LCSW Phone Number: 02/06/2024, 9:41 AM  Clinical Narrative:   Bed offers provided to pt on medicare choice document.  She will accept offer at [facility].  CSW received call from pt daughter [name] asking if any SNF offers in [place].  CSW confirmed that none of the [place] SNF able to accept pt.  Daughter in agreement with accepting [facility].  CSW confirmed with [facility]: they can receive pt today.    SNF auth request submitted in [facility] and approved: [id number], 3 days: 12/2-12/4.  MD informed.    Expected Discharge Plan: Skilled Nursing Facility                 Expected Discharge Plan and Services In-house Referral: Clinical Social Work   Post Acute Care Choice: Home Health Living arrangements for the past 2 months: Single Family Home                                       Social Drivers of Health (SDOH) Interventions SDOH Screenings   Food Insecurity: No Food Insecurity (01/30/2024)  Housing: Low Risk  (01/30/2024)  Transportation Needs: No Transportation Needs (01/30/2024)  Utilities: Not At Risk (01/30/2024)  Social Connections: Unknown (01/30/2024)  Tobacco Use: High Risk (01/31/2024)    Readmission Risk Interventions     No data to display

## 2024-02-06 NOTE — TOC Transition Note (Signed)
 Transition of Care [facility]) - Discharge Note   Patient Details  Name: Brandy Hall MRN: [id number] Date of Birth: 03/14/1960  Transition [facility]) CM/SW Contact:  Bridget Cordella Simmonds, LCSW Phone Number: 02/06/2024, 12:06 PM   Clinical Narrative:   Pt discharging to [facility], room 111. RN report to [phone].   PTAR called 1200.    Final next level of care: Skilled Nursing Facility Barriers to Discharge: Barriers Resolved   Patient Goals and CMS Choice Patient states their goals for this hospitalization and ongoing recovery are:: go home          Discharge Placement              Patient chooses bed at:  ([facility]) Patient to be transferred to facility by: PTAR Name of family member notified: daughter [name] Patient and family notified of of transfer: 02/06/24  Discharge Plan and Services Additional resources added to the After Visit Summary for   In-house Referral: Clinical Social Work   Post Acute Care Choice: Home Health                               Social Drivers of Health (SDOH) Interventions SDOH Screenings   Food Insecurity: No Food Insecurity (01/30/2024)  Housing: Low Risk  (01/30/2024)  Transportation Needs: No Transportation Needs (01/30/2024)  Utilities: Not At Risk (01/30/2024)  Social Connections: Unknown (01/30/2024)  Tobacco Use: High Risk (01/31/2024)     Readmission Risk Interventions     No data to display

## 2024-02-08 ENCOUNTER — Encounter: Admitting: Podiatry

## 2024-02-08 ENCOUNTER — Ambulatory Visit: Admitting: Podiatry

## 2024-02-08 DIAGNOSIS — I739 Peripheral vascular disease, unspecified: Secondary | ICD-10-CM

## 2024-02-08 DIAGNOSIS — I96 Gangrene, not elsewhere classified: Secondary | ICD-10-CM

## 2024-02-08 DIAGNOSIS — Z9889 Other specified postprocedural states: Secondary | ICD-10-CM

## 2024-02-08 NOTE — Progress Notes (Unsigned)
 Subjective:  Patient ID: Brandy Hall, female    DOB: 1961/01/13,  MRN: [id number]  Brandy Hall presents to clinic today for:  Chief Complaint  Patient presents with   Routine Post Op    Removed bandage, having some drainage, denies N/V/F/C/SOB, having pain 8/10   Patient is 1 week postop after undergoing a right transmetatarsal amputation secondary to gangrene/severe PVD of the right lower extremity.  She had underwent a revascularization procedure prior to the amputation while in the hospital.  She states that she does not remember much of what happened.  She is currently in a skilled nursing facility and is supposed to start rehabilitation soon.  She is in a wheelchair and surgical shoe today.  She does acknowledge that she has been on her feet probably more than she should.  Patient denies fever, chills, night sweats, nausea/vomiting.  Patient denies chest pain or shortness of breath.  Patient denies calf pain.  She did express her frustration that [facility] had not provided any care when she had previously been sent to the ED from our office.  She states that they kept telling her there was nothing they could do.  Past Medical History:  Diagnosis Date   Allergic rhinitis    COPD (chronic obstructive pulmonary disease) (HCC)    Diabetes (HCC)    GERD (gastroesophageal reflux disease)    History of colon polyps    Hypercholesteremia    Hypertension    IBS (irritable bowel syndrome)    MI (myocardial infarction) (HCC) 2016   Seasonal allergies    Trigeminal neuralgia     Past Surgical History:  Procedure Laterality Date   ABDOMINAL AORTOGRAM N/A 08/16/2023   Procedure: ABDOMINAL AORTOGRAM;  Surgeon: Lanis Fonda BRAVO, MD;  Location: [facility] INVASIVE CV LAB;  Service: Cardiovascular;  Laterality: N/A;   ABDOMINAL AORTOGRAM N/A 09/06/2023   Procedure: ABDOMINAL AORTOGRAM;  Surgeon: Lanis Fonda BRAVO, MD;  Location: [facility] INVASIVE CV LAB;  Service: Cardiovascular;   Laterality: N/A;   ABDOMINAL HYSTERECTOMY     BREAST CYST ASPIRATION Right    COLONOSCOPY  09/15/2014   [facility]. Multiple small polyps in the transverse and sigmoid colon, not removed today as the patient is on clopidogrel    ELBOW SURGERY     bilat   ESOPHAGEAL DILATION  08/17/2023   Procedure: DILATION, ESOPHAGUS;  Surgeon: Shila Gustav GAILS, MD;  Location: [facility] ENDOSCOPY;  Service: Gastroenterology;;   ESOPHAGOGASTRODUODENOSCOPY  02/06/2014   Small hiatal hernia. Mild gastritis. Gastric polyp status post polypectomy.   ESOPHAGOGASTRODUODENOSCOPY N/A 08/17/2023   Procedure: EGD (ESOPHAGOGASTRODUODENOSCOPY);  Surgeon: Nandigam, Kavitha V, MD;  Location: [facility] ENDOSCOPY;  Service: Gastroenterology;  Laterality: N/A;   GALLBLADDER SURGERY     INSERTION OF ILIAC STENT Bilateral 01/23/2024   Procedure: INSERTION OF BILATERAL ILIAC STENT, ARTERY;  Surgeon: Sheree Penne Bruckner, MD;  Location: [facility] OR;  Service: Vascular;  Laterality: Bilateral;   KNEE SURGERY     left   LAPAROSCOPY  1999   showed endometriosis   LOWER EXTREMITY ANGIOGRAPHY N/A 08/16/2023   Procedure: Lower Extremity Angiography;  Surgeon: Lanis Fonda BRAVO, MD;  Location: [facility] INVASIVE CV LAB;  Service: Cardiovascular;  Laterality: N/A;   LOWER EXTREMITY ANGIOGRAPHY Left 09/06/2023   Procedure: Lower Extremity Angiography;  Surgeon: Lanis Fonda BRAVO, MD;  Location: [facility] INVASIVE CV LAB;  Service: Cardiovascular;  Laterality: Left;   OOPHORECTOMY  11/15/2013   Dr Sherrine- and salpingectomy   ROTATOR CUFF REPAIR  left   TRANSMETATARSAL AMPUTATION Right 01/31/2024   Procedure: AMPUTATION, FOOT, TRANSMETATARSAL;  Surgeon: Malvin Marsa FALCON, DPM;  Location: [facility] OR;  Service: Orthopedics/Podiatry;  Laterality: Right;  pre op block per anesthesia right foot tma   ULTRASOUND GUIDANCE FOR VASCULAR ACCESS Bilateral 01/23/2024   Procedure: ULTRASOUND GUIDANCE, FOR VASCULAR ACCESS, BILATERAL FEMORAL ARTERIES;  Surgeon: Sheree Penne Bruckner, MD;  Location: [facility] OR;  Service: Vascular;  Laterality: Bilateral;    Allergies  Allergen Reactions   Crestor [Rosuvastatin] Other (See Comments)    Myalgias    Percocet [Oxycodone -Acetaminophen ] Hives   Statins Other (See Comments)    Per patient shuts down organs   Trulicity [Dulaglutide] Nausea And Vomiting, Nausea Only and Other (See Comments)    Pancreatitis     Objective:  Physical Examination: There are trace palpable pedal pulses.  There is moderate edema to the surgical area.  The incision is well-approximated and the staples are holding well, but there is minimal maceration along the central one third aspect of the incision along with minimal active blood drainage.  No clinical signs of infection are seen.  No purulence or gangrenous changes are noted     Latest Ref Rng & Units 01/19/2024    6:48 AM  Hemoglobin A1C  Hemoglobin-A1c 4.8 - 5.6 % 8.7     Assessment/Plan: 1. Post-operative state   2. Gangrene of right foot (HCC)   3. PVD (peripheral vascular disease)     The incision was redressed with Betadine, Xeroform gauze, and a dry sterile fluffy dressing followed by an Ace wrap.  Discussed elevation of the foot to decrease edema to the area which can cause increased drainage and therefore maceration and possible dehiscence to the incision.  The plan would be to remove the staples at her next postoperative appointment in 2 weeks, but she was informed that if there is too much swelling or any maceration present they will stay in longer.    She is concerned about starting rehab.  Informed the patient that she can do any upper extremity exercises and any hip or knee range of motion exercises while nonweightbearing or even band exercises for the hip and knee, but she needs to keep the weightbearing to a minimum at this time.  She expressed understanding.  Follow-up in 2 weeks   Adis Sturgill DSABRA [place], DPM, FACFAS [facility]     [address] [place], [place] [address]                Office [phone]  Fax [phone]

## 2024-02-08 NOTE — Progress Notes (Signed)
 Patient no showed for her first post-op visit today after undergoing a transmet amputation.

## 2024-02-20 ENCOUNTER — Ambulatory Visit

## 2024-02-20 ENCOUNTER — Encounter: Payer: Self-pay | Admitting: Podiatry

## 2024-02-20 ENCOUNTER — Ambulatory Visit: Admitting: Podiatry

## 2024-02-20 DIAGNOSIS — L03031 Cellulitis of right toe: Secondary | ICD-10-CM

## 2024-02-20 DIAGNOSIS — Z9889 Other specified postprocedural states: Secondary | ICD-10-CM

## 2024-02-20 DIAGNOSIS — I96 Gangrene, not elsewhere classified: Secondary | ICD-10-CM

## 2024-02-20 DIAGNOSIS — E1151 Type 2 diabetes mellitus with diabetic peripheral angiopathy without gangrene: Secondary | ICD-10-CM

## 2024-02-20 MED ORDER — HYDROCODONE-ACETAMINOPHEN 5-325 MG PO TABS: 1.0000 | ORAL_TABLET | Freq: Four times a day (QID) | ORAL | 0 refills | Status: DC | PRN

## 2024-02-20 MED ORDER — AMOXICILLIN-POT CLAVULANATE 875-125 MG PO TABS: 1.0000 | ORAL_TABLET | Freq: Two times a day (BID) | ORAL | 0 refills | Status: DC

## 2024-02-20 NOTE — Progress Notes (Unsigned)
 Subjective:  Patient ID: Brandy Hall, female    DOB: 1960/05/26,  MRN: [id number]  Chief Complaint  Patient presents with   Post-op Problem    POV 2  There is heat in the foot today, staples are intact    DOS: 01/31/2024 Procedure: Right transmetatarsal amputation with amniotic graft assisted closure.  63 y.o. female returns for post-op check.  Presents using wheelchair.  Reports some pain to the operative extremity.  She reports swelling and warmth to the area.  Does report that he she has been on some for short distances such as going to the bathroom and in the areas of the house that wheelchair cannot pass through.  Does report that she has not been elevated.  Review of Systems: Negative except as noted in the HPI. Denies N/V/F/Ch.  Past Medical History:  Diagnosis Date   Allergic rhinitis    COPD (chronic obstructive pulmonary disease) (HCC)    Diabetes (HCC)    GERD (gastroesophageal reflux disease)    History of colon polyps    Hypercholesteremia    Hypertension    IBS (irritable bowel syndrome)    MI (myocardial infarction) (HCC) 2016   Seasonal allergies    Trigeminal neuralgia    Current Medications[1]  Tobacco Use History[2]  Allergies[3] Objective:  There were no vitals filed for this visit. There is no height or weight on file to calculate BMI. Constitutional Well developed. Well nourished.  Vascular Foot warm and well perfused.  Trace palpable pedal pulses Capillary refill 3 to 5 seconds to the TMA flap dorsally and plantarly.  Neurologic Normal speech. Oriented to person, place, and time. Decreased light touch sensation  Dermatologic Right TMA site staples are intact along the incision without dehiscence or gapping.  There is some mild serous drainage centrally with some maceration in this area.  No active sanguinous drainage.  Surgical site warm, moderately edematous, no significant erythema.  No odor or gangrenous changes noted.  Orthopedic:  Tenderness to palpation noted about the surgical site.  Status post right TMA     Radiographs: Right foot 3 views 02/20/2024 Interval changes seen status post right foot transmetatarsal amputation.  Staples noted.  Resection margins appear stable without osteolysis or cortical erosion.  No soft tissue emphysema noted.   Assessment:   1. Post-operative state   2. Gangrene of right foot (HCC)   3. Type II diabetes mellitus with peripheral circulatory disorder (HCC)   4. Cellulitis of right toe    Plan:  Patient was evaluated and treated and all questions answered.  S/p foot surgery right transmetatarsal amputation on 01/31/2024 -Progressing as expected post-operatively. -XR: Reviewed with patient -WB Status: Nonweightbearing in surgical shoe.  May heel touch weight-bear for transfers.  Discussed importance for limited stance and gait -Sutures: Staples left intact today due to maceration within central third of incision.  If this resolves, appropriate to remove at next follow-up visit -Expect that edema likely due to leaving leg predominantly in dependent position as well as maceration from this as well as use of the amniotic tissue graft -Medications: Antibiotics sent to patient's pharmacy out of abundance of caution.  Sent Augmentin  875-125 twice daily for 7 to 10 days. -Short course of Norco sent to patient's pharmacy due to postoperative pain.  Per EMR has tolerated this well previously. -Foot redressed.  Xeroform to incision site, 4x4 gauze, ABD, Kerlix, Ace wrap.  Leave intact until follow-up visit. - If maceration is persistent and staples not ready to be  removed, may consider daily dressing changes versus every other day dressing changes with Betadine to incision line.  Return in about 1 week (around 02/27/2024) for Post Op Staple Removal.        [1]  Current Outpatient Medications:    albuterol  (PROVENTIL  HFA;VENTOLIN  HFA) 108 (90 BASE) MCG/ACT inhaler, Inhale 2 puffs into  the lungs every 6 (six) hours as needed for wheezing or shortness of breath., Disp: , Rfl:    amLODipine  (NORVASC ) 5 MG tablet, Take 1 tablet (5 mg total) by mouth daily., Disp: 90 tablet, Rfl: 0   amoxicillin -clavulanate (AUGMENTIN ) 875-125 MG tablet, Take 1 tablet by mouth 2 (two) times daily., Disp: 20 tablet, Rfl: 0   ASPIRIN  81 PO, Take 81 mg by mouth daily., Disp: , Rfl:    Cholecalciferol  (VITAMIN D -3 PO), Take 1 tablet by mouth daily., Disp: , Rfl:    clopidogrel  (PLAVIX ) 75 MG tablet, Take 75 mg by mouth daily., Disp: , Rfl:    docusate sodium  (COLACE) 100 MG capsule, Take 1 capsule (100 mg total) by mouth daily., Disp: 10 capsule, Rfl: 0   ezetimibe  (ZETIA ) 10 MG tablet, Take 10 mg by mouth daily., Disp: , Rfl:    Fluticasone-Umeclidin-Vilant (TRELEGY ELLIPTA) 200-62.5-25 MCG/ACT AEPB, Inhale 1 puff into the lungs daily in the afternoon., Disp: , Rfl:    HYDROcodone -acetaminophen  (NORCO/VICODIN) 5-325 MG tablet, Take 1-2 tablets by mouth every 6 (six) hours as needed for severe pain (pain score 7-10). Take one to two tablets every six to eight hours as needed for pain., Disp: 20 tablet, Rfl: 0   Insulin  Aspart FlexPen (NOVOLOG ) 100 UNIT/ML, Inject 0-10 Units into the skin with breakfast, with lunch, and with evening meal. Sliding Scale, Disp: , Rfl:    ipratropium-albuterol  (DUONEB) 0.5-2.5 (3) MG/3ML SOLN, Take 3 mLs by nebulization every 6 (six) hours as needed., Disp: 1080 mL, Rfl: 0   LANTUS  SOLOSTAR 100 UNIT/ML Solostar Pen, Inject 12 Units into the skin daily., Disp: 45 mL, Rfl: 11   metoprolol  tartrate (LOPRESSOR ) 25 MG tablet, Take 25 mg by mouth 2 (two) times daily., Disp: , Rfl:    montelukast  (SINGULAIR ) 10 MG tablet, Take 1 tablet by mouth at bedtime., Disp: , Rfl:    Multiple Vitamin (MULTIVITAMIN) capsule, Take 1 capsule by mouth daily., Disp: , Rfl:    nicotine  (NICODERM CQ  - DOSED IN MG/24 HOURS) 21 mg/24hr patch, Place 1 patch (21 mg total) onto the skin daily., Disp: 28  patch, Rfl: 0   nicotine  polacrilex (NICORETTE ) 2 MG gum, Take 1 each (2 mg total) by mouth as needed for smoking cessation., Disp: 100 tablet, Rfl: 0   nitroGLYCERIN  (NITROSTAT ) 0.4 MG SL tablet, Place 0.4 mg under the tongue every 5 (five) minutes as needed for chest pain., Disp: , Rfl:    pantoprazole  (PROTONIX ) 40 MG tablet, Take 1 tablet (40 mg total) by mouth 2 (two) times daily before a meal., Disp: 60 tablet, Rfl: 0   potassium chloride  (KLOR-CON ) 10 MEQ tablet, Take 10 mEq by mouth daily., Disp: , Rfl:    pregabalin  (LYRICA ) 50 MG capsule, Take 1 capsule (50 mg total) by mouth 3 (three) times daily., Disp: 90 capsule, Rfl: 0   RANEXA  500 MG 12 hr tablet, Take 500 mg by mouth 2 (two) times daily., Disp: , Rfl:  [2]  Social History Tobacco Use  Smoking Status Every Day   Current packs/day: 0.50   Average packs/day: 0.5 packs/day for 25.0 years (12.5 ttl pk-yrs)  Types: Cigarettes  Smokeless Tobacco Never  [3]  Allergies Allergen Reactions   Crestor [Rosuvastatin] Other (See Comments)    Myalgias    Percocet [Oxycodone -Acetaminophen ] Hives   Statins Other (See Comments)    Per patient shuts down organs   Trulicity [Dulaglutide] Nausea And Vomiting, Nausea Only and Other (See Comments)    Pancreatitis

## 2024-02-26 ENCOUNTER — Ambulatory Visit (HOSPITAL_COMMUNITY)

## 2024-02-28 ENCOUNTER — Ambulatory Visit: Admitting: Podiatry

## 2024-02-28 DIAGNOSIS — Z9889 Other specified postprocedural states: Secondary | ICD-10-CM

## 2024-02-28 DIAGNOSIS — R600 Localized edema: Secondary | ICD-10-CM | POA: Diagnosis not present

## 2024-02-28 DIAGNOSIS — I739 Peripheral vascular disease, unspecified: Secondary | ICD-10-CM

## 2024-02-28 DIAGNOSIS — I96 Gangrene, not elsewhere classified: Secondary | ICD-10-CM

## 2024-02-28 NOTE — Progress Notes (Signed)
 "      Subjective:  Patient ID: Brandy Hall, female    DOB: 27-Feb-1961,  MRN: [id number]  Brandy Hall presents to clinic today for:  Chief Complaint  Patient presents with   Routine Post Op    PO DOS 01/31/24 Staples comeing out    Patient is 4 weeks postop after undergoing a right TMA secondary to vascular compromise/forefoot gangrene.  Patient denies fever, chills, night sweats, nausea/vomiting.  Patient denies chest pain or shortness of breath.  Patient denies calf pain.  Family member is with her today.  Past Medical History:  Diagnosis Date   Allergic rhinitis    COPD (chronic obstructive pulmonary disease) (HCC)    Diabetes (HCC)    GERD (gastroesophageal reflux disease)    History of colon polyps    Hypercholesteremia    Hypertension    IBS (irritable bowel syndrome)    MI (myocardial infarction) (HCC) 2016   Seasonal allergies    Trigeminal neuralgia     Past Surgical History:  Procedure Laterality Date   ABDOMINAL AORTOGRAM N/A 08/16/2023   Procedure: ABDOMINAL AORTOGRAM;  Surgeon: Lanis Fonda BRAVO, MD;  Location: [facility] INVASIVE CV LAB;  Service: Cardiovascular;  Laterality: N/A;   ABDOMINAL AORTOGRAM N/A 09/06/2023   Procedure: ABDOMINAL AORTOGRAM;  Surgeon: Lanis Fonda BRAVO, MD;  Location: [facility] INVASIVE CV LAB;  Service: Cardiovascular;  Laterality: N/A;   ABDOMINAL HYSTERECTOMY     BREAST CYST ASPIRATION Right    COLONOSCOPY  09/15/2014   [facility]. Multiple small polyps in the transverse and sigmoid colon, not removed today as the patient is on clopidogrel    ELBOW SURGERY     bilat   ESOPHAGEAL DILATION  08/17/2023   Procedure: DILATION, ESOPHAGUS;  Surgeon: Shila Gustav GAILS, MD;  Location: [facility] ENDOSCOPY;  Service: Gastroenterology;;   ESOPHAGOGASTRODUODENOSCOPY  02/06/2014   Small hiatal hernia. Mild gastritis. Gastric polyp status post polypectomy.   ESOPHAGOGASTRODUODENOSCOPY N/A 08/17/2023   Procedure: EGD (ESOPHAGOGASTRODUODENOSCOPY);  Surgeon:  Nandigam, Kavitha V, MD;  Location: [facility] ENDOSCOPY;  Service: Gastroenterology;  Laterality: N/A;   GALLBLADDER SURGERY     INSERTION OF ILIAC STENT Bilateral 01/23/2024   Procedure: INSERTION OF BILATERAL ILIAC STENT, ARTERY;  Surgeon: Sheree Penne Bruckner, MD;  Location: [facility] OR;  Service: Vascular;  Laterality: Bilateral;   KNEE SURGERY     left   LAPAROSCOPY  1999   showed endometriosis   LOWER EXTREMITY ANGIOGRAPHY N/A 08/16/2023   Procedure: Lower Extremity Angiography;  Surgeon: Lanis Fonda BRAVO, MD;  Location: [facility] INVASIVE CV LAB;  Service: Cardiovascular;  Laterality: N/A;   LOWER EXTREMITY ANGIOGRAPHY Left 09/06/2023   Procedure: Lower Extremity Angiography;  Surgeon: Lanis Fonda BRAVO, MD;  Location: [facility] INVASIVE CV LAB;  Service: Cardiovascular;  Laterality: Left;   OOPHORECTOMY  11/15/2013   Dr Sherrine- and salpingectomy   ROTATOR CUFF REPAIR     left   TRANSMETATARSAL AMPUTATION Right 01/31/2024   Procedure: AMPUTATION, FOOT, TRANSMETATARSAL;  Surgeon: Malvin Marsa FALCON, DPM;  Location: [facility] OR;  Service: Orthopedics/Podiatry;  Laterality: Right;  pre op block per anesthesia right foot tma   ULTRASOUND GUIDANCE FOR VASCULAR ACCESS Bilateral 01/23/2024   Procedure: ULTRASOUND GUIDANCE, FOR VASCULAR ACCESS, BILATERAL FEMORAL ARTERIES;  Surgeon: Sheree Penne Bruckner, MD;  Location: [facility] OR;  Service: Vascular;  Laterality: Bilateral;   Allergies[1]  Objective:  Physical Examination: There are non-palpable pedal pulses.  There is localized edema to the surgical area.  The incision has some central one third dehiscence  with mild gapping noted.  The exposed area is fibrogranular.  There is pain on palpation across the incision.  Staples are intact on the medial and lateral portions but have pulled through the dorsal aspect of the incision in the central one third portion of the incision.     Latest Ref Rng & Units 01/19/2024    6:48 AM  Hemoglobin A1C  Hemoglobin-A1c 4.8 -  5.6 % 8.7    Assessment/Plan: 1. Gangrene of right foot (HCC)   2. Post-operative state   3. PVD (peripheral vascular disease)   4. Localized edema     Patient did not tolerate removing of the staples without any anesthesia so 5 cc of 1% lidocaine  plain were administered across the incision for local anesthesia.  The anesthetic cream previously attempted was not effective in alleviating her discomfort.  Staples eventually removed today.  Over 45 minutes spent with the patient attempting to remove them.  The small central area with mild dehiscence was gently debrided with a dermal curette and Steri-Strips were applied across the area.  Antibiotic ointment and a dry sterile dressing was applied.  An Ace wrap was applied for compression.  Patient will keep this clean and dry and keep the foot elevated until her visit next week.  Her daughter did indicate that the patient has been driving and quite ambulatory.  Patient encouraged not to go Christmas shopping today and to just go home and elevate the foot.  Will recheck in 1 to 2 weeks.  She also needs to focus on getting the A1c under better control as this will only impair healing if it remains above 8.   Return for 1-2 weeks with Dr. Lamount (please put in 30 minute spot).   Brandy Hall, DPM, FACFAS [facility]     [address] [place], [place] [address]                Office [phone]  Fax [phone]     [1]  Allergies Allergen Reactions   Crestor [Rosuvastatin] Other (See Comments)    Myalgias    Percocet [Oxycodone -Acetaminophen ] Hives   Statins Other (See Comments)    Per patient shuts down organs   Trulicity [Dulaglutide] Nausea And Vomiting, Nausea Only and Other (See Comments)    Pancreatitis    "

## 2024-03-01 ENCOUNTER — Ambulatory Visit (HOSPITAL_COMMUNITY)
Admission: RE | Admit: 2024-03-01 | Discharge: 2024-03-01 | Disposition: A | Discharge status: Home / Self Care | Source: Ambulatory Visit | Attending: Vascular Surgery | Admitting: Vascular Surgery

## 2024-03-01 DIAGNOSIS — Z8679 Personal history of other diseases of the circulatory system: Secondary | ICD-10-CM | POA: Insufficient documentation

## 2024-03-01 DIAGNOSIS — Z9889 Other specified postprocedural states: Secondary | ICD-10-CM | POA: Insufficient documentation

## 2024-03-01 MED ORDER — IOHEXOL 350 MG/ML SOLN
80.0000 mL | Freq: Once | INTRAVENOUS | Status: AC | PRN
  Administered 2024-03-01: 80 mL via INTRAVENOUS

## 2024-03-06 ENCOUNTER — Ambulatory Visit: Attending: Vascular Surgery | Admitting: Vascular Surgery

## 2024-03-06 ENCOUNTER — Encounter: Payer: Self-pay | Admitting: Vascular Surgery

## 2024-03-06 VITALS — BP 143/100 | HR 120 | Temp 97.8°F

## 2024-03-06 DIAGNOSIS — I771 Stricture of artery: Secondary | ICD-10-CM

## 2024-03-06 NOTE — Progress Notes (Signed)
" °  ° °  Subjective:     Patient ID: Brandy Hall, female   DOB: 04-28-1960, 63 y.o.   MRN: [id number]  HPI 63 year old female with history of aortoiliac occlusive disease has now undergone CERAB aortic reconstruction.  She subsequently underwent right transmetatarsal amputation with podiatry and has been followed weekly.  She states that she does have some pain and numbness in the right foot but is otherwise recovering well.  She was recently diagnosed with UTI and is on antibiotics.  She does take Plavix  intermittently.  She continues to smoke daily.   Review of Systems As above    Objective:   Physical Exam Vitals:   03/06/24 1349  BP: (!) 143/100  Pulse: (!) 120  Temp: 97.8 F (36.6 C)  SpO2: 94%   Awake alert and oriented Nonlabored respirations Abdomen and groins are soft Palpable R AT   CTA read pending, images reviewed with patient     Assessment/plan     63 year old female status post aortic reconstruction with stenting appears to have stenosis of the right renal artery, the left renal artery was concerning at the time of surgery but this appears to fill briskly.  We discussed proceeding with angiography from a left common femoral approach to evaluate the bilateral renal arteries for consideration of stenting.  We can also evaluate the right lower extremity at that time.  We again discussed smoking cessation and need for aspirin , Plavix  and statin.  All questions were answered in the presence of her daughter and they demonstrate good understanding.     Travia Onstad C. Sheree, MD [facility] Office: [phone] Pager: [phone]   "

## 2024-03-09 ENCOUNTER — Emergency Department (HOSPITAL_COMMUNITY)

## 2024-03-09 ENCOUNTER — Encounter (HOSPITAL_COMMUNITY): Payer: Self-pay

## 2024-03-09 ENCOUNTER — Other Ambulatory Visit: Payer: Self-pay

## 2024-03-09 ENCOUNTER — Inpatient Hospital Stay (HOSPITAL_COMMUNITY)
Admission: EM | Admit: 2024-03-09 | Discharge: 2024-03-15 | DRG: 464 | Disposition: A | Discharge status: Home / Self Care | Attending: Internal Medicine | Admitting: Internal Medicine

## 2024-03-09 DIAGNOSIS — E871 Hypo-osmolality and hyponatremia: Secondary | ICD-10-CM | POA: Diagnosis not present

## 2024-03-09 DIAGNOSIS — Z91128 Patient's intentional underdosing of medication regimen for other reason: Secondary | ICD-10-CM

## 2024-03-09 DIAGNOSIS — N280 Ischemia and infarction of kidney: Secondary | ICD-10-CM | POA: Diagnosis present

## 2024-03-09 DIAGNOSIS — Y835 Amputation of limb(s) as the cause of abnormal reaction of the patient, or of later complication, without mention of misadventure at the time of the procedure: Secondary | ICD-10-CM | POA: Diagnosis present

## 2024-03-09 DIAGNOSIS — L089 Local infection of the skin and subcutaneous tissue, unspecified: Secondary | ICD-10-CM | POA: Diagnosis present

## 2024-03-09 DIAGNOSIS — Z716 Tobacco abuse counseling: Secondary | ICD-10-CM

## 2024-03-09 DIAGNOSIS — F17210 Nicotine dependence, cigarettes, uncomplicated: Secondary | ICD-10-CM | POA: Diagnosis present

## 2024-03-09 DIAGNOSIS — I10 Essential (primary) hypertension: Secondary | ICD-10-CM | POA: Diagnosis present

## 2024-03-09 DIAGNOSIS — Z8679 Personal history of other diseases of the circulatory system: Secondary | ICD-10-CM

## 2024-03-09 DIAGNOSIS — Z794 Long term (current) use of insulin: Secondary | ICD-10-CM

## 2024-03-09 DIAGNOSIS — E872 Acidosis, unspecified: Secondary | ICD-10-CM | POA: Diagnosis not present

## 2024-03-09 DIAGNOSIS — T8781 Dehiscence of amputation stump: Secondary | ICD-10-CM | POA: Diagnosis present

## 2024-03-09 DIAGNOSIS — M86371 Chronic multifocal osteomyelitis, right ankle and foot: Secondary | ICD-10-CM | POA: Diagnosis present

## 2024-03-09 DIAGNOSIS — E11628 Type 2 diabetes mellitus with other skin complications: Secondary | ICD-10-CM | POA: Diagnosis present

## 2024-03-09 DIAGNOSIS — Z1152 Encounter for screening for COVID-19: Secondary | ICD-10-CM

## 2024-03-09 DIAGNOSIS — E782 Mixed hyperlipidemia: Secondary | ICD-10-CM | POA: Diagnosis not present

## 2024-03-09 DIAGNOSIS — I82612 Acute embolism and thrombosis of superficial veins of left upper extremity: Secondary | ICD-10-CM | POA: Diagnosis present

## 2024-03-09 DIAGNOSIS — E1142 Type 2 diabetes mellitus with diabetic polyneuropathy: Secondary | ICD-10-CM | POA: Diagnosis not present

## 2024-03-09 DIAGNOSIS — N179 Acute kidney failure, unspecified: Secondary | ICD-10-CM | POA: Diagnosis not present

## 2024-03-09 DIAGNOSIS — E7800 Pure hypercholesterolemia, unspecified: Secondary | ICD-10-CM | POA: Diagnosis present

## 2024-03-09 DIAGNOSIS — I25119 Atherosclerotic heart disease of native coronary artery with unspecified angina pectoris: Secondary | ICD-10-CM | POA: Diagnosis present

## 2024-03-09 DIAGNOSIS — R519 Headache, unspecified: Secondary | ICD-10-CM

## 2024-03-09 DIAGNOSIS — Z7982 Long term (current) use of aspirin: Secondary | ICD-10-CM

## 2024-03-09 DIAGNOSIS — I96 Gangrene, not elsewhere classified: Secondary | ICD-10-CM | POA: Diagnosis present

## 2024-03-09 DIAGNOSIS — N390 Urinary tract infection, site not specified: Secondary | ICD-10-CM | POA: Diagnosis present

## 2024-03-09 DIAGNOSIS — E876 Hypokalemia: Secondary | ICD-10-CM | POA: Diagnosis present

## 2024-03-09 DIAGNOSIS — Z90710 Acquired absence of both cervix and uterus: Secondary | ICD-10-CM

## 2024-03-09 DIAGNOSIS — Z8709 Personal history of other diseases of the respiratory system: Secondary | ICD-10-CM | POA: Diagnosis not present

## 2024-03-09 DIAGNOSIS — E860 Dehydration: Secondary | ICD-10-CM | POA: Diagnosis present

## 2024-03-09 DIAGNOSIS — J449 Chronic obstructive pulmonary disease, unspecified: Secondary | ICD-10-CM | POA: Diagnosis present

## 2024-03-09 DIAGNOSIS — E1152 Type 2 diabetes mellitus with diabetic peripheral angiopathy with gangrene: Secondary | ICD-10-CM | POA: Diagnosis present

## 2024-03-09 DIAGNOSIS — R739 Hyperglycemia, unspecified: Principal | ICD-10-CM | POA: Diagnosis present

## 2024-03-09 DIAGNOSIS — Z89431 Acquired absence of right foot: Secondary | ICD-10-CM

## 2024-03-09 DIAGNOSIS — L02611 Cutaneous abscess of right foot: Secondary | ICD-10-CM | POA: Diagnosis present

## 2024-03-09 DIAGNOSIS — Z720 Tobacco use: Secondary | ICD-10-CM | POA: Diagnosis not present

## 2024-03-09 DIAGNOSIS — Z860100 Personal history of colon polyps, unspecified: Secondary | ICD-10-CM

## 2024-03-09 DIAGNOSIS — Z79899 Other long term (current) drug therapy: Secondary | ICD-10-CM

## 2024-03-09 DIAGNOSIS — E119 Type 2 diabetes mellitus without complications: Secondary | ICD-10-CM

## 2024-03-09 DIAGNOSIS — I82622 Acute embolism and thrombosis of deep veins of left upper extremity: Secondary | ICD-10-CM | POA: Diagnosis present

## 2024-03-09 DIAGNOSIS — Y92009 Unspecified place in unspecified non-institutional (private) residence as the place of occurrence of the external cause: Secondary | ICD-10-CM

## 2024-03-09 DIAGNOSIS — Z7902 Long term (current) use of antithrombotics/antiplatelets: Secondary | ICD-10-CM

## 2024-03-09 DIAGNOSIS — I2510 Atherosclerotic heart disease of native coronary artery without angina pectoris: Secondary | ICD-10-CM | POA: Diagnosis present

## 2024-03-09 DIAGNOSIS — Z91199 Patient's noncompliance with other medical treatment and regimen due to unspecified reason: Secondary | ICD-10-CM

## 2024-03-09 DIAGNOSIS — E1169 Type 2 diabetes mellitus with other specified complication: Secondary | ICD-10-CM | POA: Diagnosis present

## 2024-03-09 DIAGNOSIS — Z886 Allergy status to analgesic agent status: Secondary | ICD-10-CM

## 2024-03-09 DIAGNOSIS — T8743 Infection of amputation stump, right lower extremity: Principal | ICD-10-CM | POA: Diagnosis present

## 2024-03-09 DIAGNOSIS — L03031 Cellulitis of right toe: Secondary | ICD-10-CM

## 2024-03-09 DIAGNOSIS — E785 Hyperlipidemia, unspecified: Secondary | ICD-10-CM | POA: Diagnosis present

## 2024-03-09 DIAGNOSIS — E1165 Type 2 diabetes mellitus with hyperglycemia: Secondary | ICD-10-CM | POA: Diagnosis present

## 2024-03-09 DIAGNOSIS — T383X6A Underdosing of insulin and oral hypoglycemic [antidiabetic] drugs, initial encounter: Secondary | ICD-10-CM | POA: Diagnosis present

## 2024-03-09 DIAGNOSIS — I252 Old myocardial infarction: Secondary | ICD-10-CM

## 2024-03-09 LAB — CBC
HCT: 38.1 % (ref 36.0–46.0)
Hemoglobin: 14.2 g/dL (ref 12.0–15.0)
MCH: 32.9 pg (ref 26.0–34.0)
MCHC: 37.3 g/dL — ABNORMAL HIGH (ref 30.0–36.0)
MCV: 88.2 fL (ref 80.0–100.0)
Platelets: 279 K/uL (ref 150–400)
RBC: 4.32 MIL/uL (ref 3.87–5.11)
RDW: 13.7 % (ref 11.5–15.5)
WBC: 9 K/uL (ref 4.0–10.5)
nRBC: 0 % (ref 0.0–0.2)

## 2024-03-09 LAB — I-STAT CHEM 8, ED
BUN: 7 mg/dL — ABNORMAL LOW (ref 8–23)
Calcium, Ion: 0.95 mmol/L — ABNORMAL LOW (ref 1.15–1.40)
Chloride: 87 mmol/L — ABNORMAL LOW (ref 98–111)
Creatinine, Ser: 0.9 mg/dL (ref 0.44–1.00)
Glucose, Bld: 554 mg/dL (ref 70–99)
HCT: 45 % (ref 36.0–46.0)
Hemoglobin: 15.3 g/dL — ABNORMAL HIGH (ref 12.0–15.0)
Potassium: 8.1 mmol/L (ref 3.5–5.1)
Sodium: 116 mmol/L — CL (ref 135–145)
TCO2: 24 mmol/L (ref 22–32)

## 2024-03-09 LAB — CBG MONITORING, ED
Glucose-Capillary: 554 mg/dL (ref 70–99)
Glucose-Capillary: 433 mg/dL — ABNORMAL HIGH (ref 70–99)

## 2024-03-09 LAB — URINALYSIS, ROUTINE W REFLEX MICROSCOPIC
Bilirubin Urine: NEGATIVE
Glucose, UA: >=500 mg/dL — AB
Hgb urine dipstick: NEGATIVE
Ketones, ur: NEGATIVE mg/dL
Leukocytes,Ua: NEGATIVE
Nitrite: NEGATIVE
Protein, ur: 100 mg/dL — AB
Specific Gravity, Urine: 1.025 (ref 1.005–1.030)
pH: 6 (ref 5.0–8.0)

## 2024-03-09 LAB — I-STAT VENOUS BLOOD GAS, ED
Acid-Base Excess: 1 mmol/L (ref 0.0–2.0)
Bicarbonate: 26.3 mmol/L (ref 20.0–28.0)
Calcium, Ion: 0.95 mmol/L — ABNORMAL LOW (ref 1.15–1.40)
HCT: 44 % (ref 36.0–46.0)
Hemoglobin: 15 g/dL (ref 12.0–15.0)
O2 Saturation: 55 %
Potassium: 8.1 mmol/L (ref 3.5–5.1)
Sodium: 116 mmol/L — CL (ref 135–145)
TCO2: 28 mmol/L (ref 22–32)
pCO2, Ven: 44.7 mmHg (ref 44–60)
pH, Ven: 7.378 (ref 7.25–7.43)
pO2, Ven: 30 mmHg — CL (ref 32–45)

## 2024-03-09 LAB — AMMONIA: Ammonia: 31 umol/L (ref 9–35)

## 2024-03-09 LAB — BASIC METABOLIC PANEL WITH GFR
Anion gap: 13 (ref 5–15)
BUN: 6 mg/dL — ABNORMAL LOW (ref 8–23)
CO2: 21 mmol/L — ABNORMAL LOW (ref 22–32)
Calcium: 9.1 mg/dL (ref 8.9–10.3)
Chloride: 86 mmol/L — ABNORMAL LOW (ref 98–111)
Creatinine, Ser: 1.02 mg/dL — ABNORMAL HIGH (ref 0.44–1.00)
GFR, Estimated: >60 mL/min
Glucose, Bld: 492 mg/dL — ABNORMAL HIGH (ref 70–99)
Potassium: 3.1 mmol/L — ABNORMAL LOW (ref 3.5–5.1)
Sodium: 121 mmol/L — ABNORMAL LOW (ref 135–145)

## 2024-03-09 LAB — URINE DRUG SCREEN
Amphetamines: NEGATIVE
Barbiturates: NEGATIVE
Benzodiazepines: NEGATIVE
Cocaine: NEGATIVE
Fentanyl: NEGATIVE
Methadone Scn, Ur: NEGATIVE
Opiates: NEGATIVE
Tetrahydrocannabinol: NEGATIVE

## 2024-03-09 LAB — I-STAT CG4 LACTIC ACID, ED: Lactic Acid, Venous: 2.7 mmol/L (ref 0.5–1.9)

## 2024-03-09 LAB — MAGNESIUM: Magnesium: 1.9 mg/dL (ref 1.7–2.4)

## 2024-03-09 LAB — BETA-HYDROXYBUTYRIC ACID: Beta-Hydroxybutyric Acid: 0.08 mmol/L (ref 0.05–0.27)

## 2024-03-09 LAB — OSMOLALITY: Osmolality: 287 mosm/kg (ref 275–295)

## 2024-03-09 LAB — ETHANOL: Alcohol, Ethyl (B): <15 mg/dL

## 2024-03-09 LAB — TROPONIN T, HIGH SENSITIVITY: Troponin T High Sensitivity: <15 ng/L (ref 0–19)

## 2024-03-09 MED ORDER — ACETAMINOPHEN 650 MG RE SUPP: 650.0000 mg | Freq: Four times a day (QID) | RECTAL | Status: DC | PRN

## 2024-03-09 MED ORDER — POTASSIUM CHLORIDE CRYS ER 20 MEQ PO TBCR: 40.0000 meq | EXTENDED_RELEASE_TABLET | Freq: Once | ORAL | Status: DC

## 2024-03-09 MED ORDER — POTASSIUM CHLORIDE 10 MEQ/100ML IV SOLN: 10.0000 meq | Freq: Once | INTRAVENOUS | Status: DC

## 2024-03-09 MED ORDER — VANCOMYCIN HCL 750 MG/150ML IV SOLN: 750.0000 mg | Freq: Two times a day (BID) | INTRAVENOUS | Status: DC

## 2024-03-09 MED ORDER — LACTATED RINGERS IV BOLUS
1000.0000 mL | Freq: Once | INTRAVENOUS | Status: AC
  Administered 2024-03-09: 1000 mL via INTRAVENOUS

## 2024-03-09 MED ORDER — DIPHENHYDRAMINE HCL 50 MG/ML IJ SOLN
25.0000 mg | Freq: Once | INTRAMUSCULAR | Status: AC
  Administered 2024-03-09: 25 mg via INTRAVENOUS
  Filled 2024-03-09: qty 1

## 2024-03-09 MED ORDER — PIPERACILLIN-TAZOBACTAM 3.375 G IVPB 30 MIN
3.3750 g | Freq: Once | INTRAVENOUS | Status: AC
  Administered 2024-03-09: 3.375 g via INTRAVENOUS
  Filled 2024-03-09: qty 50

## 2024-03-09 MED ORDER — INSULIN ASPART 100 UNIT/ML IJ SOLN
8.0000 [IU] | Freq: Once | INTRAMUSCULAR | Status: AC
  Administered 2024-03-09: 8 [IU] via SUBCUTANEOUS
  Filled 2024-03-09: qty 8

## 2024-03-09 MED ORDER — VANCOMYCIN HCL 2000 MG/400ML IV SOLN
2000.0000 mg | Freq: Once | INTRAVENOUS | Status: DC
  Filled 2024-03-09: qty 400

## 2024-03-09 MED ORDER — PROCHLORPERAZINE EDISYLATE 10 MG/2ML IJ SOLN
10.0000 mg | Freq: Once | INTRAMUSCULAR | Status: AC
  Administered 2024-03-09: 10 mg via INTRAVENOUS
  Filled 2024-03-09: qty 2

## 2024-03-09 MED ORDER — SODIUM CHLORIDE 0.9 % IV BOLUS
1000.0000 mL | Freq: Once | INTRAVENOUS | Status: AC
  Administered 2024-03-09: 1000 mL via INTRAVENOUS

## 2024-03-09 MED ORDER — ACETAMINOPHEN 325 MG PO TABS
650.0000 mg | ORAL_TABLET | Freq: Four times a day (QID) | ORAL | Status: DC | PRN
  Administered 2024-03-10 – 2024-03-13 (×3): 650 mg via ORAL
  Filled 2024-03-09 (×4): qty 2

## 2024-03-09 MED ORDER — PIPERACILLIN-TAZOBACTAM 3.375 G IVPB: 3.3750 g | Freq: Three times a day (TID) | INTRAVENOUS | Status: DC

## 2024-03-09 NOTE — ED Notes (Signed)
 To CT

## 2024-03-09 NOTE — ED Notes (Signed)
CBG 554. 

## 2024-03-09 NOTE — ED Provider Notes (Signed)
 " [place] EMERGENCY DEPARTMENT AT [facility] Provider Note   CSN: [id number] Arrival date & time: 03/09/24  1950     Patient presents with: Hyperglycemia   NEWELL FRATER is a 64 y.o. female.   64 year old female presenting with multiple complaints.  Patient notes headache and neck pain, reports that she struck her head on the fridge earlier today, denies any other injuries/fall, does not believe that she is on blood thinners but is unsure.  Reports dysuria, currently on Macrobid  for treatment of a urinary tract infection, endorses urinary frequency and increased thirst.  Reports generalized weakness for several days.  Patient's family members report that she has not been taking her medications as she should, her family member does not believe she has taken any of her medications for at least several days, patient notes that she did take 20U of Lantus  earlier today due to hyperglycemia.  When asked why she is not taking her medications she reports because I did not feel good and did not feel like it.  Patient found to be notably hyperglycemic with an initial blood glucose of 554.  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   Hyperglycemia      Prior to Admission medications  Medication Sig Start Date End Date Taking? Authorizing Provider  albuterol  (PROVENTIL  HFA;VENTOLIN  HFA) 108 (90 BASE) MCG/ACT inhaler Inhale 2 puffs into the lungs every 6 (six) hours as needed for wheezing or shortness of breath.    [provider]  amLODipine  (NORVASC ) 5 MG tablet Take 1 tablet (5 mg total) by mouth daily. 01/24/24   Pokhrel, Laxman, MD  amoxicillin -clavulanate (AUGMENTIN ) 875-125 MG tablet Take 1 tablet by mouth 2 (two) times daily. 02/20/24   Lamount Ethan LITTIE, DPM  ASPIRIN  81 PO Take 81 mg by mouth daily.    [provider]  Cholecalciferol  (VITAMIN D -3 PO) Take 1 tablet by mouth daily.     [provider]  clopidogrel  (PLAVIX ) 75 MG tablet Take 75 mg by mouth daily.    [provider]  docusate sodium  (COLACE) 100 MG capsule Take 1 capsule (100 mg total) by mouth daily. 01/25/24   Pokhrel, Laxman, MD  ezetimibe  (ZETIA ) 10 MG tablet Take 10 mg by mouth daily. 06/28/23   [provider]  Fluticasone-Umeclidin-Vilant (TRELEGY ELLIPTA) 200-62.5-25 MCG/ACT AEPB Inhale 1 puff into the lungs daily in the afternoon.    [provider]  HYDROcodone -acetaminophen  (NORCO/VICODIN) 5-325 MG tablet Take 1-2 tablets by mouth every 6 (six) hours as needed for severe pain (pain score 7-10). Take one to two tablets every six to eight hours as needed for pain. 02/20/24   Lamount Ethan LITTIE, DPM  Insulin  Aspart FlexPen (NOVOLOG ) 100 UNIT/ML Inject 0-10 Units into the skin with breakfast, with lunch, and with evening meal. Sliding Scale 08/14/23   [provider]  ipratropium-albuterol  (DUONEB) 0.5-2.5 (3) MG/3ML SOLN Take 3 mLs by nebulization every 6 (six) hours as needed. 02/06/24   Lue Elsie BROCKS, MD  LANTUS  SOLOSTAR 100 UNIT/ML Solostar Pen Inject 12 Units into the skin daily. 02/06/24   Lue Elsie BROCKS, MD  metoprolol  tartrate (LOPRESSOR ) 25 MG tablet Take 25 mg by mouth 2 (two) times daily.    [provider]  montelukast  (SINGULAIR ) 10 MG tablet Take 1 tablet by mouth at bedtime.    [provider]  Multiple Vitamin (MULTIVITAMIN) capsule Take 1 capsule by mouth daily.  [provider]  nicotine  (NICODERM CQ  - DOSED IN MG/24 HOURS) 21 mg/24hr patch Place 1 patch (21 mg total) onto the skin daily. 01/25/24   Pokhrel, Laxman, MD  nicotine  polacrilex (NICORETTE ) 2 MG gum Take 1 each (2 mg total) by mouth as needed for smoking cessation. 01/24/24   Pokhrel, Vernal, MD  nitroGLYCERIN  (NITROSTAT ) 0.4 MG SL tablet Place 0.4 mg under the tongue every 5 (five) minutes as needed for chest pain.    [provider]  pantoprazole   (PROTONIX ) 40 MG tablet Take 1 tablet (40 mg total) by mouth 2 (two) times daily before a meal. 08/17/23   Tawkaliyar, Roya, DO  potassium chloride  (KLOR-CON ) 10 MEQ tablet Take 10 mEq by mouth daily.    [provider]  pregabalin  (LYRICA ) 50 MG capsule Take 1 capsule (50 mg total) by mouth 3 (three) times daily. 02/06/24 03/07/24  Lue Elsie BROCKS, MD  RANEXA  500 MG 12 hr tablet Take 500 mg by mouth 2 (two) times daily. 08/08/23   [provider]    Allergies: Crestor [rosuvastatin], Percocet [oxycodone -acetaminophen ], Statins, and Trulicity [dulaglutide]    Review of Systems  Updated Vital Signs  Vitals:   03/09/24 1954 03/09/24 2009 03/09/24 2130 03/09/24 2145  BP:  (!) 136/98 (!) 158/101 (!) 180/133  Pulse:  88 80 81  Resp:  18 (!) 21 18  Temp:  97.8 F (36.6 C)    TempSrc:  Oral    SpO2:  98% 100% 100%  Weight: 79.8 kg     Height: 5' 8 (1.727 m)        Physical Exam Vitals and nursing note reviewed.  HENT:     Head: Normocephalic and atraumatic.     Mouth/Throat:     Mouth: Mucous membranes are dry.  Eyes:     Extraocular Movements: Extraocular movements intact.     Conjunctiva/sclera: Conjunctivae normal.     Pupils: Pupils are equal, round, and reactive to light.     Comments: Photosensitivity   Cardiovascular:     Rate and Rhythm: Normal rate and regular rhythm.     Heart sounds: Normal heart sounds.  Pulmonary:     Effort: Pulmonary effort is normal.     Breath sounds: Normal breath sounds.     Comments: Infrequent cough Abdominal:     Palpations: Abdomen is soft.     Tenderness: There is abdominal tenderness (diffuse, easily distractible). There is no guarding.  Musculoskeletal:     Cervical back: Normal range of motion.     Right lower leg: Edema (mild, nonpitting) present.     Left lower leg: No edema.     Comments: Bandage in place on R foot Unable to assess upper/lower extremity strength as patient has difficulty following  commands/staying on task.  No appreciable sensory deficits of bilateral upper or lower extremities.  Skin:    General: Skin is warm and dry.     Comments: R foot wound with borderline dehiscence following recent partial amputation, some discharge, see photo  Neurological:     Mental Status: She is alert and oriented to person, place, and time.     Comments: No obvious focal neurologic deficits however patient has difficulty participating in neurologic exam, difficulty staying on task/following commands, answers questions appropriately, no obvious facial droop/asymmetry     (all labs ordered are listed, but only abnormal results are displayed) Labs Reviewed  CBC - Abnormal; Notable for the following components:      Result Value  MCHC 37.3 (*)    All other components within normal limits  URINALYSIS, ROUTINE W REFLEX MICROSCOPIC - Abnormal; Notable for the following components:   Color, Urine STRAW (*)    Glucose, UA >=500 (*)    Protein, ur 100 (*)    Bacteria, UA RARE (*)    All other components within normal limits  CBG MONITORING, ED - Abnormal; Notable for the following components:   Glucose-Capillary 554 (*)    All other components within normal limits  I-STAT VENOUS BLOOD GAS, ED - Abnormal; Notable for the following components:   pO2, Ven 30 (*)    Sodium 116 (*)    Potassium 8.1 (*)    Calcium, Ion 0.95 (*)    All other components within normal limits  I-STAT CHEM 8, ED - Abnormal; Notable for the following components:   Sodium 116 (*)    Potassium 8.1 (*)    Chloride 87 (*)    BUN 7 (*)    Glucose, Bld 554 (*)    Calcium, Ion 0.95 (*)    Hemoglobin 15.3 (*)    All other components within normal limits  RESP PANEL BY RT-PCR (RSV, FLU A&B, COVID)  [id number]  URINE CULTURE  CULTURE, BLOOD (ROUTINE X 2)  CULTURE, BLOOD (ROUTINE X 2)  URINE DRUG SCREEN  OSMOLALITY  AMMONIA  ETHANOL  BETA-HYDROXYBUTYRIC ACID  BASIC METABOLIC PANEL WITH GFR  MAGNESIUM   CBG  MONITORING, ED  I-STAT CG4 LACTIC ACID, ED  TROPONIN T, HIGH SENSITIVITY    EKG: EKG Interpretation Date/Time:  Saturday March 09 2024 21:52:06 EST Ventricular Rate:  83 PR Interval:    QRS Duration:  121 QT Interval:  482 QTC Calculation: 567 R Axis:   74  Text Interpretation: Sinus rhythm, artifact, nonspecific ST-T changes Confirmed by Ula Barter [phone]) on 03/09/2024 10:22:02 PM  Radiology: CT Cervical Spine Wo Contrast Result Date: 03/09/2024 EXAM: CT CERVICAL SPINE WITHOUT CONTRAST 03/09/2024 10:21:34 PM TECHNIQUE: CT of the cervical spine was performed without the administration of intravenous contrast. Multiplanar reformatted images are provided for review. Automated exposure control, iterative reconstruction, and/or weight based adjustment of the mA/kV was utilized to reduce the radiation dose to as low as reasonably achievable. COMPARISON: None available. CLINICAL HISTORY: Neck trauma (Age >= 65y) FINDINGS: BONES AND ALIGNMENT: No acute fracture or traumatic malalignment. DEGENERATIVE CHANGES: No significant degenerative changes. SOFT TISSUES: Multiple bilateral thyroid nodules. These have been previously evaluated by ultrasound. No prevertebral soft tissue swelling. IMPRESSION: 1. No evidence of acute traumatic injury. Electronically signed by: Franky Crease MD 03/09/2024 10:28 PM EST RP Workstation: [id number]   CT Head Wo Contrast Result Date: 03/09/2024 EXAM: CT HEAD WITHOUT 03/09/2024 10:21:34 PM TECHNIQUE: CT of the head was performed without the administration of intravenous contrast. Automated exposure control, iterative reconstruction, and/or weight based adjustment of the mA/kV was utilized to reduce the radiation dose to as low as reasonably achievable. COMPARISON: 03/03/2023 CLINICAL HISTORY: headache, hit head on refrigerator FINDINGS: BRAIN AND VENTRICLES: No acute intracranial hemorrhage. No mass effect or midline shift. No extra-axial fluid collection. No evidence of  acute infarct. No hydrocephalus. ORBITS: No acute abnormality. SINUSES AND MASTOIDS: No acute abnormality. SOFT TISSUES AND SKULL: No acute skull fracture. No acute soft tissue abnormality. IMPRESSION: 1. No acute intracranial abnormality. Electronically signed by: Franky Crease MD 03/09/2024 10:26 PM EST RP Workstation: [id number]   DG Chest Portable 1 View Result Date: 03/09/2024 EXAM: 1 VIEW(S) XRAY OF THE CHEST 03/09/2024 10:00:00 PM COMPARISON: 02/22/2024  CLINICAL HISTORY: cough FINDINGS: LUNGS AND PLEURA: No focal pulmonary opacity. No pleural effusion. No pneumothorax. HEART AND MEDIASTINUM: Aortic atherosclerosis. No acute abnormality of the cardiac and mediastinal silhouettes. BONES AND SOFT TISSUES: No acute osseous abnormality. IMPRESSION: 1. No active cardiopulmonary disease. Electronically signed by: Franky Crease MD 03/09/2024 10:07 PM EST RP Workstation: [id number]     Procedures   Medications Ordered in the ED  potassium chloride  SA (KLOR-CON  M) CR tablet 40 mEq (has no administration in time range)  potassium chloride  10 mEq in 100 mL IVPB (has no administration in time range)  piperacillin -tazobactam (ZOSYN ) IVPB 3.375 g (has no administration in time range)  vancomycin  (VANCOREADY) IVPB 2000 mg/400 mL (has no administration in time range)  piperacillin -tazobactam (ZOSYN ) IVPB 3.375 g (has no administration in time range)  vancomycin  (VANCOREADY) IVPB 750 mg/150 mL (has no administration in time range)  sodium chloride  0.9 % bolus 1,000 mL (0 mLs Intravenous Stopped 03/09/24 2238)  lactated ringers  bolus 1,000 mL (0 mLs Intravenous Stopped 03/09/24 2238)  prochlorperazine  (COMPAZINE ) injection 10 mg (10 mg Intravenous Given 03/09/24 2252)  diphenhydrAMINE  (BENADRYL ) injection 25 mg (25 mg Intravenous Given 03/09/24 2252)                                    Medical Decision Making This patient presents to the ED for concern of multiple complaints, this involves an extensive number of  treatment options, and is a complaint that carries with it a high risk of complications and morbidity.  The differential diagnosis includes DKA, HHS, intracranial hemorrhage, intracranial mass, urinary tract infection, medication noncompliance   Co morbidities that complicate the patient evaluation  Diabetes, hypertension   Additional history obtained:  Additional history obtained from record review External records from outside source obtained and reviewed including prior hospital discharge summary   Lab Tests:  I Ordered, and personally interpreted labs.  The pertinent results include: CBC unremarkable.  VBG without acidosis, pH of 7.378, low pO2.  I-STAT Chem-8 notable for hyponatremia with sodium of 116, likely falsely low in the setting of hyperglycemia, corrected sodium between 123 and 127; BMP with sodium of 121 and potassium of 3.1, when corrected for hyperglycemia sodium is between 127-130.  Initial CBG 554.  Urinalysis notable for glucosuria with protein and rare bacteria, will send for culture.  UDS negative. Troponin < 15. Ammonia WNL. Osmolality WNL. Magnesium  1.9, WNL. Ethanol undetectable. Beta-hydroxybutyric acid normal, 0.08. Repeat CBG 433. Lactic elevated at 2.7.     Imaging Studies ordered:  I ordered imaging studies including CXR, CT head  I independently visualized and interpreted imaging which showed  - CXR: 1. No active cardiopulmonary disease. - CT head: 1. No acute intracranial abnormality. - CT C-spine: 1. No evidence of acute traumatic injury. - XR R foot: 1. Mild soft tissue gas in the dorsal soft tissues with a focal defect over the stump, possibly representing ulceration and infection with a gas-forming organism. 2. Status post right forefoot amputation at the mid metatarsals with intact bony margins and no radiographic evidence of osteomyelitis.  I agree with the radiologist interpretation   Cardiac Monitoring: / EKG:  The patient was maintained on a  cardiac monitor.  I personally viewed and interpreted the cardiac monitored which showed an underlying rhythm of: NSR   Consultations Obtained:  I requested consultation with the hospitalist,  and discussed lab and imaging findings as well as pertinent  plan - they recommend: I spoke with Dr. Marcene who agrees that this patient is appropriate for admission.    Problem List / ED Course / Critical interventions / Medication management   I ordered medication including IV fluid boluses for hyperglycemia, Compazine /Benadryl  for migraine cocktail, Zosyn /vancomycin  for broad-spectrum antibiotic coverage given concern for osteomyelitis, 8U insulin  subQ for hyperglycemia  Reevaluation of the patient after these medicines showed that the patient improved I have reviewed the patients home medicines and have made adjustments as needed   Social Determinants of Health:  Tobacco use   Test / Admission - Considered:  Physical exam notable as above, no obvious focal neurologic deficits on exam however patient does have difficulty participating in neurologic assessment as she is unable to fully follow commands when assessing strength/sensation, complains of headache with photophobia as well as generalized abdominal tenderness, no fevers at home but is recovering from a recent partial amputation of her right foot, see photo above. Concern for osteomyelitis given appearance of foot wound, will proceed with x-ray imaging and broad-spectrum antibiotic coverage. No leukocytosis or /fever/tachycardia, however patient does have a lactic of 2.7.  Findings on x-ray raise concern for wound infection, may benefit from further evaluation with CT/MRI. Patient notably hyperglycemic in the emergency department today, admits that she has not been taking her insulin  for several days, did take 20 units of Lantus  at home at some point today but has otherwise been noncompliant with these medications.  Will reassess degree of  hyperglycemia after fluid boluses. Patient was initially found to be hyperkalemic on i-STAT BMP, however repeat BMP shows hypokalemia with potassium of 3.1.  I believe that initial i-STAT result was likely an error and could be secondary to hemolysis, however recheck of patient's potassium is pending at time of admission, will hold off on administration of IV/p.o. potassium for now. Patient remains hyperglycemic in the 400s, will administer short acting insulin  to correct hyperglycemia.  Given multiple ongoing factors as noted above, including hyperglycemia and concern for diabetic foot wound, will admit patient to the hospitalist service for further management of this.  Patient's family is in agreement with this plan.   Staffed with Dr. Ula who independently evaluated this patient at the bedside  Amount and/or Complexity of Data Reviewed Labs: ordered. Radiology: ordered.  Risk Prescription drug management. Decision regarding hospitalization.        Final diagnoses:  Hyperglycemia  Nonintractable headache, unspecified chronicity pattern, unspecified headache type  Diabetic foot infection [facility])    ED Discharge Orders     None          Glendia Rocky SAILOR, [place] 03/10/24 0001  "

## 2024-03-09 NOTE — ED Provider Triage Note (Signed)
 Emergency Medicine Provider Triage Evaluation Note  Brandy Hall , a 64 y.o. female  was evaluated in triage.  Pt complains of multiple complaints.  Review of Systems  Positive: Headache, neck pain, dizziness, dysuria, fatigue Negative: Chest pain, abdominal pain, nausea  Physical Exam  BP (!) 136/98 (BP Location: Right Arm)   Pulse 88   Temp 97.8 F (36.6 C) (Oral)   Resp 18   Ht 5' 8 (1.727 m)   Wt 79.8 kg   SpO2 98%   BMI 26.76 kg/m  Gen:   Awake, no distress   Resp:  Normal effort  MSK:   Moves extremities without difficulty   Medical Decision Making  Medically screening exam initiated at 8:12 PM.  Appropriate orders placed.  Brandy Hall was informed that the remainder of the evaluation will be completed by another provider, this initial triage assessment does not replace that evaluation, and the importance of remaining in the ED until their evaluation is complete.  Patient presenting for multiple complaints.  She arrives via EMS who did note hyperglycemia with CBG of 594 prior to arrival.  Patient states that she did take 20 Ness of short acting insulin  earlier today.  She takes long-acting insulin  as needed.  She did take her long-acting insulin  yesterday due to inability to get her sugars under control.  She has had dysuria for the past week.  She is currently on an antibiotic.  She thinks that it is nitrofurantoin .  Her symptoms have not improved on antibiotics.  Patient also endorsing fatigue, generalized weakness, headache, neck pain.  She denies any chest pain, shortness of breath, abdominal pain, or nausea.  No tenderness present on exam.   Brandy Motto, MD 03/09/24 2013

## 2024-03-09 NOTE — Progress Notes (Signed)
 Pharmacy Antibiotic Note  Brandy Hall is a 64 y.o. female admitted on 03/09/2024 with hyperglycemia/head and neck pain.  Pharmacy has been consulted for cefepime /vancomycin  dosing for cellulitis/osteomyelitis concerns.  -sCr 1.02 (bl~0.8) -R. Foot XR: no evidence of osteomyelitis -Started macrobid  for UTI outpatient  Plan: -Zosyn  3.375gm IV every 8 hours -Vancomycin  2g IV x1 -Vancomycin  750mg  IV every 12 hours (AUC 517, Vd 0.72, IBW, sCr 1.02) -Monitor renal function Follow up signs of clinical improvement, LOT, de-escalation of antibiotics   Height: 5' 8 (172.7 cm) Weight: 79.8 kg (176 lb) IBW/kg (Calculated) : 63.9  Temp (24hrs), Avg:97.8 F (36.6 C), Min:97.8 F (36.6 C), Max:97.8 F (36.6 C)  Recent Labs  Lab 03/09/24 1957 03/09/24 2028 03/09/24 2226  WBC 9.0  --   --   CREATININE  --  0.90 1.02*    Estimated Creatinine Clearance: 61 mL/min (A) (by C-G formula based on SCr of 1.02 mg/dL (H)).    Allergies[1]  Antimicrobials this admission: Zosyn  1/3 >>  Vancomycin  1/3 >>   Microbiology results: 1/4 BCx: ordered 1/3 UCx:    Thank you for allowing pharmacy to be a part of this patients care.  Lynwood Poplar, PharmD, BCPS Clinical Pharmacist 03/09/2024 11:17 PM       [1]  Allergies Allergen Reactions   Crestor [Rosuvastatin] Other (See Comments)    Myalgias    Percocet [Oxycodone -Acetaminophen ] Hives   Statins Other (See Comments)    Per patient shuts down organs   Trulicity [Dulaglutide] Nausea And Vomiting, Nausea Only and Other (See Comments)    Pancreatitis

## 2024-03-09 NOTE — ED Triage Notes (Addendum)
 Pt complaining of feeling sick for the last 4 days. Is confused unknown when she last took her insulin . Was reading 594 and now high. Gave 500 ml of fluid per ems.. headache, neck pain and dizziness. Has a current uti.

## 2024-03-09 NOTE — ED Notes (Signed)
 Patient is unable to urinate at this time in EMS Triage.

## 2024-03-09 NOTE — ED Notes (Signed)
 Patient urinated on herself. This NT, and others helped place a brief and clean pants on this patient. Patient is cleaned, and sitting up in bed.

## 2024-03-10 ENCOUNTER — Observation Stay (HOSPITAL_COMMUNITY)

## 2024-03-10 ENCOUNTER — Encounter (HOSPITAL_COMMUNITY): Payer: Self-pay | Admitting: Internal Medicine

## 2024-03-10 DIAGNOSIS — T8781 Dehiscence of amputation stump: Secondary | ICD-10-CM | POA: Diagnosis present

## 2024-03-10 DIAGNOSIS — F17210 Nicotine dependence, cigarettes, uncomplicated: Secondary | ICD-10-CM | POA: Diagnosis not present

## 2024-03-10 DIAGNOSIS — J449 Chronic obstructive pulmonary disease, unspecified: Secondary | ICD-10-CM | POA: Diagnosis present

## 2024-03-10 DIAGNOSIS — M86371 Chronic multifocal osteomyelitis, right ankle and foot: Secondary | ICD-10-CM | POA: Diagnosis present

## 2024-03-10 DIAGNOSIS — N390 Urinary tract infection, site not specified: Secondary | ICD-10-CM | POA: Diagnosis present

## 2024-03-10 DIAGNOSIS — E1142 Type 2 diabetes mellitus with diabetic polyneuropathy: Secondary | ICD-10-CM | POA: Diagnosis present

## 2024-03-10 DIAGNOSIS — Z8709 Personal history of other diseases of the respiratory system: Secondary | ICD-10-CM

## 2024-03-10 DIAGNOSIS — E11628 Type 2 diabetes mellitus with other skin complications: Secondary | ICD-10-CM | POA: Diagnosis present

## 2024-03-10 DIAGNOSIS — L089 Local infection of the skin and subcutaneous tissue, unspecified: Secondary | ICD-10-CM | POA: Diagnosis not present

## 2024-03-10 DIAGNOSIS — Z7902 Long term (current) use of antithrombotics/antiplatelets: Secondary | ICD-10-CM | POA: Diagnosis not present

## 2024-03-10 DIAGNOSIS — E872 Acidosis, unspecified: Secondary | ICD-10-CM | POA: Diagnosis present

## 2024-03-10 DIAGNOSIS — E1165 Type 2 diabetes mellitus with hyperglycemia: Secondary | ICD-10-CM | POA: Diagnosis present

## 2024-03-10 DIAGNOSIS — I96 Gangrene, not elsewhere classified: Secondary | ICD-10-CM | POA: Diagnosis not present

## 2024-03-10 DIAGNOSIS — E860 Dehydration: Secondary | ICD-10-CM | POA: Diagnosis present

## 2024-03-10 DIAGNOSIS — E871 Hypo-osmolality and hyponatremia: Secondary | ICD-10-CM | POA: Diagnosis present

## 2024-03-10 DIAGNOSIS — T8743 Infection of amputation stump, right lower extremity: Secondary | ICD-10-CM | POA: Diagnosis present

## 2024-03-10 DIAGNOSIS — M7989 Other specified soft tissue disorders: Secondary | ICD-10-CM | POA: Diagnosis not present

## 2024-03-10 DIAGNOSIS — Z95828 Presence of other vascular implants and grafts: Secondary | ICD-10-CM | POA: Diagnosis not present

## 2024-03-10 DIAGNOSIS — E876 Hypokalemia: Secondary | ICD-10-CM | POA: Diagnosis present

## 2024-03-10 DIAGNOSIS — I25119 Atherosclerotic heart disease of native coronary artery with unspecified angina pectoris: Secondary | ICD-10-CM | POA: Diagnosis not present

## 2024-03-10 DIAGNOSIS — I701 Atherosclerosis of renal artery: Secondary | ICD-10-CM | POA: Diagnosis not present

## 2024-03-10 DIAGNOSIS — R519 Headache, unspecified: Secondary | ICD-10-CM | POA: Diagnosis not present

## 2024-03-10 DIAGNOSIS — I82622 Acute embolism and thrombosis of deep veins of left upper extremity: Secondary | ICD-10-CM | POA: Diagnosis present

## 2024-03-10 DIAGNOSIS — L03031 Cellulitis of right toe: Secondary | ICD-10-CM | POA: Diagnosis not present

## 2024-03-10 DIAGNOSIS — E1152 Type 2 diabetes mellitus with diabetic peripheral angiopathy with gangrene: Secondary | ICD-10-CM | POA: Diagnosis present

## 2024-03-10 DIAGNOSIS — J410 Simple chronic bronchitis: Secondary | ICD-10-CM | POA: Diagnosis not present

## 2024-03-10 DIAGNOSIS — I7090 Unspecified atherosclerosis: Secondary | ICD-10-CM | POA: Diagnosis not present

## 2024-03-10 DIAGNOSIS — I10 Essential (primary) hypertension: Secondary | ICD-10-CM | POA: Diagnosis present

## 2024-03-10 DIAGNOSIS — E1169 Type 2 diabetes mellitus with other specified complication: Secondary | ICD-10-CM | POA: Diagnosis present

## 2024-03-10 DIAGNOSIS — R739 Hyperglycemia, unspecified: Secondary | ICD-10-CM | POA: Diagnosis present

## 2024-03-10 DIAGNOSIS — Y835 Amputation of limb(s) as the cause of abnormal reaction of the patient, or of later complication, without mention of misadventure at the time of the procedure: Secondary | ICD-10-CM | POA: Diagnosis present

## 2024-03-10 DIAGNOSIS — Z9889 Other specified postprocedural states: Secondary | ICD-10-CM | POA: Diagnosis not present

## 2024-03-10 DIAGNOSIS — N280 Ischemia and infarction of kidney: Secondary | ICD-10-CM | POA: Diagnosis present

## 2024-03-10 DIAGNOSIS — I82612 Acute embolism and thrombosis of superficial veins of left upper extremity: Secondary | ICD-10-CM | POA: Diagnosis present

## 2024-03-10 DIAGNOSIS — Z1152 Encounter for screening for COVID-19: Secondary | ICD-10-CM | POA: Diagnosis not present

## 2024-03-10 DIAGNOSIS — L02611 Cutaneous abscess of right foot: Secondary | ICD-10-CM | POA: Diagnosis present

## 2024-03-10 DIAGNOSIS — I2510 Atherosclerotic heart disease of native coronary artery without angina pectoris: Secondary | ICD-10-CM | POA: Diagnosis not present

## 2024-03-10 DIAGNOSIS — Y92009 Unspecified place in unspecified non-institutional (private) residence as the place of occurrence of the external cause: Secondary | ICD-10-CM | POA: Diagnosis not present

## 2024-03-10 DIAGNOSIS — N179 Acute kidney failure, unspecified: Secondary | ICD-10-CM | POA: Diagnosis not present

## 2024-03-10 DIAGNOSIS — Z89431 Acquired absence of right foot: Secondary | ICD-10-CM | POA: Diagnosis not present

## 2024-03-10 DIAGNOSIS — T8789 Other complications of amputation stump: Secondary | ICD-10-CM | POA: Diagnosis not present

## 2024-03-10 DIAGNOSIS — E7800 Pure hypercholesterolemia, unspecified: Secondary | ICD-10-CM | POA: Diagnosis present

## 2024-03-10 LAB — CBC WITH DIFFERENTIAL/PLATELET
Abs Immature Granulocytes: 0.08 K/uL — ABNORMAL HIGH (ref 0.00–0.07)
Basophils Absolute: 0 K/uL (ref 0.0–0.1)
Basophils Relative: 0 %
Eosinophils Absolute: 0.1 K/uL (ref 0.0–0.5)
Eosinophils Relative: 1 %
HCT: 37 % (ref 36.0–46.0)
Hemoglobin: 13.6 g/dL (ref 12.0–15.0)
Immature Granulocytes: 1 %
Lymphocytes Relative: 25 %
Lymphs Abs: 2 K/uL (ref 0.7–4.0)
MCH: 32.8 pg (ref 26.0–34.0)
MCHC: 36.8 g/dL — ABNORMAL HIGH (ref 30.0–36.0)
MCV: 89.2 fL (ref 80.0–100.0)
Monocytes Absolute: 0.8 K/uL (ref 0.1–1.0)
Monocytes Relative: 10 %
Neutro Abs: 5.1 K/uL (ref 1.7–7.7)
Neutrophils Relative %: 63 %
Platelets: 244 K/uL (ref 150–400)
RBC: 4.15 MIL/uL (ref 3.87–5.11)
RDW: 13.5 % (ref 11.5–15.5)
WBC: 8.1 K/uL (ref 4.0–10.5)
nRBC: 0 % (ref 0.0–0.2)

## 2024-03-10 LAB — I-STAT CG4 LACTIC ACID, ED
Lactic Acid, Venous: 2.9 mmol/L (ref 0.5–1.9)
Lactic Acid, Venous: 3.4 mmol/L (ref 0.5–1.9)
Lactic Acid, Venous: 0.8 mmol/L (ref 0.5–1.9)

## 2024-03-10 LAB — COMPREHENSIVE METABOLIC PANEL WITH GFR
ALT: 7 U/L (ref 0–44)
AST: 16 U/L (ref 15–41)
Albumin: 4.1 g/dL (ref 3.5–5.0)
Alkaline Phosphatase: 155 U/L — ABNORMAL HIGH (ref 38–126)
Anion gap: 13 (ref 5–15)
BUN: 5 mg/dL — ABNORMAL LOW (ref 8–23)
CO2: 24 mmol/L (ref 22–32)
Calcium: 9.5 mg/dL (ref 8.9–10.3)
Chloride: 92 mmol/L — ABNORMAL LOW (ref 98–111)
Creatinine, Ser: 0.93 mg/dL (ref 0.44–1.00)
GFR, Estimated: >60 mL/min
Glucose, Bld: 136 mg/dL — ABNORMAL HIGH (ref 70–99)
Potassium: 2.7 mmol/L — CL (ref 3.5–5.1)
Sodium: 130 mmol/L — ABNORMAL LOW (ref 135–145)
Total Bilirubin: 0.8 mg/dL (ref 0.0–1.2)
Total Protein: 7.9 g/dL (ref 6.5–8.1)

## 2024-03-10 LAB — CBG MONITORING, ED
Glucose-Capillary: 137 mg/dL — ABNORMAL HIGH (ref 70–99)
Glucose-Capillary: 140 mg/dL — ABNORMAL HIGH (ref 70–99)
Glucose-Capillary: 203 mg/dL — ABNORMAL HIGH (ref 70–99)
Glucose-Capillary: 230 mg/dL — ABNORMAL HIGH (ref 70–99)
Glucose-Capillary: 247 mg/dL — ABNORMAL HIGH (ref 70–99)
Glucose-Capillary: 283 mg/dL — ABNORMAL HIGH (ref 70–99)
Glucose-Capillary: 345 mg/dL — ABNORMAL HIGH (ref 70–99)

## 2024-03-10 LAB — HEMOGLOBIN A1C
Hgb A1c MFr Bld: 10 % — ABNORMAL HIGH (ref 4.8–5.6)
Mean Plasma Glucose: 240.3 mg/dL

## 2024-03-10 LAB — RESP PANEL BY RT-PCR (RSV, FLU A&B, COVID)  RVPGX2
Influenza A by PCR: NEGATIVE
Influenza B by PCR: NEGATIVE
Resp Syncytial Virus by PCR: NEGATIVE
SARS Coronavirus 2 by RT PCR: NEGATIVE

## 2024-03-10 LAB — MAGNESIUM: Magnesium: 1.9 mg/dL (ref 1.7–2.4)

## 2024-03-10 LAB — POTASSIUM: Potassium: 3 mmol/L — ABNORMAL LOW (ref 3.5–5.1)

## 2024-03-10 LAB — SEDIMENTATION RATE: Sed Rate: 29 mm/h — ABNORMAL HIGH (ref 0–22)

## 2024-03-10 LAB — C-REACTIVE PROTEIN: CRP: 1.3 mg/dL — ABNORMAL HIGH

## 2024-03-10 LAB — CK: Total CK: 15 U/L — ABNORMAL LOW (ref 38–234)

## 2024-03-10 LAB — TSH: TSH: 0.89 u[IU]/mL (ref 0.350–4.500)

## 2024-03-10 MED ORDER — POTASSIUM CHLORIDE 10 MEQ/100ML IV SOLN
10.0000 meq | INTRAVENOUS | Status: AC
  Administered 2024-03-10 (×6): 10 meq via INTRAVENOUS
  Filled 2024-03-10 (×6): qty 100

## 2024-03-10 MED ORDER — LACTATED RINGERS IV BOLUS: 1000.0000 mL | Freq: Once | INTRAVENOUS | Status: DC

## 2024-03-10 MED ORDER — LINEZOLID 600 MG/300ML IV SOLN
600.0000 mg | Freq: Two times a day (BID) | INTRAVENOUS | Status: DC
  Administered 2024-03-10 – 2024-03-12 (×6): 600 mg via INTRAVENOUS
  Administered 2024-03-13: 600 mL via INTRAVENOUS
  Administered 2024-03-13: 600 mg via INTRAVENOUS
  Filled 2024-03-10 (×10): qty 300

## 2024-03-10 MED ORDER — POTASSIUM CHLORIDE CRYS ER 20 MEQ PO TBCR
40.0000 meq | EXTENDED_RELEASE_TABLET | Freq: Once | ORAL | Status: AC
  Administered 2024-03-10: 40 meq via ORAL
  Filled 2024-03-10: qty 2

## 2024-03-10 MED ORDER — LACTATED RINGERS IV SOLN: INTRAVENOUS | Status: DC

## 2024-03-10 MED ORDER — ALBUTEROL SULFATE (2.5 MG/3ML) 0.083% IN NEBU: 2.5000 mg | INHALATION_SOLUTION | RESPIRATORY_TRACT | Status: DC | PRN

## 2024-03-10 MED ORDER — METOPROLOL TARTRATE 25 MG PO TABS
25.0000 mg | ORAL_TABLET | Freq: Two times a day (BID) | ORAL | Status: DC
  Administered 2024-03-10 – 2024-03-11 (×3): 25 mg via ORAL
  Filled 2024-03-10 (×4): qty 1

## 2024-03-10 MED ORDER — DEXTROSE 50 % IV SOLN
1.0000 | INTRAVENOUS | Status: DC | PRN
  Administered 2024-03-12: 50 mL via INTRAVENOUS
  Filled 2024-03-10: qty 50

## 2024-03-10 MED ORDER — INSULIN GLARGINE-YFGN 100 UNIT/ML ~~LOC~~ SOLN
7.0000 [IU] | SUBCUTANEOUS | Status: DC
  Filled 2024-03-10: qty 0.07

## 2024-03-10 MED ORDER — ONDANSETRON HCL 4 MG/2ML IJ SOLN: 4.0000 mg | Freq: Four times a day (QID) | INTRAMUSCULAR | Status: DC | PRN

## 2024-03-10 MED ORDER — RANOLAZINE ER 500 MG PO TB12
500.0000 mg | ORAL_TABLET | Freq: Two times a day (BID) | ORAL | Status: DC
  Administered 2024-03-10 – 2024-03-15 (×10): 500 mg via ORAL
  Filled 2024-03-10 (×11): qty 1

## 2024-03-10 MED ORDER — NICOTINE POLACRILEX 2 MG MT GUM: 2.0000 mg | CHEWING_GUM | OROMUCOSAL | Status: DC | PRN

## 2024-03-10 MED ORDER — NALOXONE HCL 0.4 MG/ML IJ SOLN: 0.4000 mg | INTRAMUSCULAR | Status: DC | PRN

## 2024-03-10 MED ORDER — MELATONIN 3 MG PO TABS
3.0000 mg | ORAL_TABLET | Freq: Every evening | ORAL | Status: DC | PRN
  Administered 2024-03-14: 3 mg via ORAL
  Filled 2024-03-10: qty 1

## 2024-03-10 MED ORDER — AMLODIPINE BESYLATE 5 MG PO TABS
5.0000 mg | ORAL_TABLET | Freq: Every day | ORAL | Status: DC
  Administered 2024-03-10: 5 mg via ORAL
  Filled 2024-03-10 (×2): qty 1

## 2024-03-10 MED ORDER — EZETIMIBE 10 MG PO TABS
10.0000 mg | ORAL_TABLET | Freq: Every day | ORAL | Status: DC
  Administered 2024-03-10 – 2024-03-15 (×5): 10 mg via ORAL
  Filled 2024-03-10 (×6): qty 1

## 2024-03-10 MED ORDER — PREGABALIN 25 MG PO CAPS
50.0000 mg | ORAL_CAPSULE | Freq: Three times a day (TID) | ORAL | Status: DC
  Administered 2024-03-10 – 2024-03-12 (×6): 50 mg via ORAL
  Filled 2024-03-10 (×7): qty 2

## 2024-03-10 MED ORDER — NICOTINE 14 MG/24HR TD PT24: 14.0000 mg | MEDICATED_PATCH | Freq: Every day | TRANSDERMAL | Status: DC | PRN

## 2024-03-10 MED ORDER — SODIUM CHLORIDE 0.9 % IV SOLN: INTRAVENOUS | Status: DC

## 2024-03-10 MED ORDER — INSULIN GLARGINE 100 UNIT/ML ~~LOC~~ SOLN
7.0000 [IU] | SUBCUTANEOUS | Status: DC
  Administered 2024-03-10: 7 [IU] via SUBCUTANEOUS
  Filled 2024-03-10 (×2): qty 0.07

## 2024-03-10 MED ORDER — INSULIN ASPART 100 UNIT/ML IJ SOLN
0.0000 [IU] | INTRAMUSCULAR | Status: DC
  Administered 2024-03-10: 3 [IU] via SUBCUTANEOUS
  Administered 2024-03-10: 1 [IU] via SUBCUTANEOUS
  Administered 2024-03-10: 5 [IU] via SUBCUTANEOUS
  Administered 2024-03-10: 3 [IU] via SUBCUTANEOUS
  Administered 2024-03-11: 2 [IU] via SUBCUTANEOUS
  Administered 2024-03-11: 7 [IU] via SUBCUTANEOUS
  Administered 2024-03-11: 2 [IU] via SUBCUTANEOUS
  Administered 2024-03-11: 9 [IU] via SUBCUTANEOUS
  Filled 2024-03-10: qty 1
  Filled 2024-03-10: qty 3
  Filled 2024-03-10: qty 5
  Filled 2024-03-10: qty 2
  Filled 2024-03-10: qty 1
  Filled 2024-03-10: qty 2

## 2024-03-10 MED ORDER — INSULIN GLARGINE 100 UNIT/ML ~~LOC~~ SOLN
5.0000 [IU] | SUBCUTANEOUS | Status: DC
  Filled 2024-03-10: qty 0.05

## 2024-03-10 MED ORDER — SODIUM CHLORIDE 0.9 % IV SOLN
2.0000 g | Freq: Three times a day (TID) | INTRAVENOUS | Status: DC
  Administered 2024-03-10 – 2024-03-11 (×5): 2 g via INTRAVENOUS
  Filled 2024-03-10 (×7): qty 12.5

## 2024-03-10 MED ORDER — LACTATED RINGERS IV BOLUS
1000.0000 mL | INTRAVENOUS | Status: AC
  Administered 2024-03-10: 1000 mL via INTRAVENOUS

## 2024-03-10 MED ORDER — FENTANYL CITRATE (PF) 50 MCG/ML IJ SOSY
12.5000 ug | PREFILLED_SYRINGE | INTRAMUSCULAR | Status: DC | PRN
  Administered 2024-03-13: 12.5 ug via INTRAVENOUS
  Filled 2024-03-10: qty 1

## 2024-03-10 NOTE — H&P (Signed)
 " History and Physical      Brandy Hall FMW:[id number] DOB: 09-13-1960 DOA: 03/09/2024; DOS: 03/10/2024  PCP: Marelyn Quill, MD  Patient coming from: home   I have personally briefly reviewed patient's old medical records in [facility]  Chief Complaint: Polyuria  HPI: Brandy Hall is a 64 y.o. female with medical history significant for type 2 diabetes mellitus complicated by diabetic peripheral polyneuropathy, right-sided diabetic foot wound status post transmetatarsal amputation on 01/31/2024, COPD, central pretension, hyperlipidemia, who is admitted to [facility] on 03/09/2024 with hyperglycemia after presenting from home to [facility] ED complaining of polyuria.   In the setting of diabetic right foot infection, the patient was hospitalized at [facility] from 01/29/2024 will 12-25, during which time she underwent transmetatarsal amputation of the right foot on 01/31/2024 with Dr. Malvin of [facility].  She was subsequently discharged to SNF for temporary rehab, from which she subsequently completed her temporary rehab assignment and was discharged from SNF back to home.   The patient reports over the last 3 to 4 days that she has been experiencing increasing polyuria as well as polydipsia.  This is also been associated with mild intermittent symmetrical frontal lobe headache, without any associated visual or olfactory aura nor any associated acute focal weakness, nor any associated nausea/vomiting.   Patient's family conveys that the patient has been poorly compliant in taking her home medications since she was discharged home from SNF, including that the patient has not taken any of her Long or short acting insulin  over the course of the last week, noting that she is on Lantus  12 units SQ daily as well as NovoLog  sliding scale insulin  3 times daily with meals.  Per chart review, her most recent hemoglobin A1c level was found to be 8.7% when checked on 01/19/2024.  Aside  from her polyuria, she denies any associated dysuria or gross hematuria.  No recent subjective fever, chills, rigors, or generalized malaise.  No recent shortness of breath, cough, or chest pain.  Over the last 2 to 3 days, she has also noted increase in swelling associated with her right transmetatarsal wound, associate with some surrounding increase in erythema as well as drainage.  She noted that she has been on 2 recent oral antibiotics as an outpatient, including for her postop right foot wound.  Per chart review, it appears that she recently completed a course of Augmentin , and that she is finishing a course of Macrobid  at this time.     ED Course:  Vital signs in the ED were notable for the following: Afebrile; heart rates in the 70s to 80s; softer pressures in the 130s to 170s; respiratory rate 1623, oxygen saturation 98 to 100% on room air.  Labs were notable for the following: Initial CBG noted to be 554.  Beta-hydroxybutyrate acid 0.08.  VBG 7.378/44.7.  BMP was notable for the following: Sodium 121, which adjusted to approximately 128 when taking to Concomitant hyperglycemia, which is relative to most recent prior sodium of 130 on 02/02/2024, and 132 on 02/01/2024, potassium 3.1, bicarbonate 21, anion gap 13, creatinine 1.02, glucose 492.  Magnesium  level 1.9.  High sensitive troponin T was less than 15.  Serum ethanol level less than 15.  CBC notable for white cell count 9000, hemoglobin 14.2, platelet count 279.  Urinalysis showed no blood cells and was leukocyte esterase/nitrate negative while showing negative ketone finding as well as specific gravity 1.025.  Urinary drug screen is pan negative.  Serum osmolality 287.  Initial lactate 2.7, with repeat trending up to 2.9.  Blood cultures x 2 were collected prior to initiation of IV antibiotics.  COVID, influenza, RSV PCR were all negative.  Per my interpretation, EKG in ED demonstrated the following: Presenting EKG ED interpretation of  which was limited by the presence of motion artifact, however, within these confines, appears to show sinus rhythm with heart rate 83, without overt T wave or ST changes.  Imaging in the ED, per corresponding formal radiology read, was notable for the following: 1 view chest x-ray showed no evidence of acute cardiopulmonary process, including evidence of infiltrate, edema, effusion, or pneumothorax.  In the setting of her presenting headaches over the last 2 days, CT head showed no evidence of acute intracranial process, including no evidence of intracranial Atriance of acute infarct.  Complete plain films of the right foot demonstrated status post right forefoot amputation at the mid metatarsals with evidence of soft tissue edema and mild soft tissue gas in the dorsal soft tissues, potentially representing ulceration and infection, without overt evidence of acute osteomyelitis.  While in the ED, the following were administered: Benadryl  25 mg IV x 1 dose, Compazine  10 mg IV x 1 dose, lactated Ringer 's x 2 L bolus, normal saline x 1 L bolus, IV vancomycin , Zosyn .  After receiving a total of 3 L of IV fluid, repeat CBG trended down from 5 5 4-2 47.  At this time, the patient received NovoLog  8 units subcu x 1 dose.  Subsequently, the patient was admitted for further evaluation management of presenting hyperglycemia, along with concern for potential operative right foot wound, with presenting labs notable for lactic acidosis, hypokalemia, as well as subacute hyponatremia.     Review of Systems: As per HPI otherwise 10 point review of systems negative.   Past Medical History:  Diagnosis Date   Allergic rhinitis    COPD (chronic obstructive pulmonary disease) (HCC)    Diabetes (HCC)    GERD (gastroesophageal reflux disease)    History of colon polyps    Hypercholesteremia    Hypertension    IBS (irritable bowel syndrome)    MI (myocardial infarction) (HCC) 2016   Seasonal allergies     Trigeminal neuralgia     Past Surgical History:  Procedure Laterality Date   ABDOMINAL AORTOGRAM N/A 08/16/2023   Procedure: ABDOMINAL AORTOGRAM;  Surgeon: Lanis Fonda BRAVO, MD;  [facility] INVASIVE CV LAB;  Service: Cardiovascular;  Laterality: N/A;   ABDOMINAL AORTOGRAM N/A 09/06/2023   Procedure: ABDOMINAL AORTOGRAM;  Surgeon: Lanis Fonda BRAVO, MD;  Location: [facility] INVASIVE CV LAB;  Service: Cardiovascular;  Laterality: N/A;   ABDOMINAL HYSTERECTOMY     BREAST CYST ASPIRATION Right    COLONOSCOPY  09/15/2014   [facility]. Multiple small polyps in the transverse and sigmoid colon, not removed today as the patient is on clopidogrel    ELBOW SURGERY     bilat   ESOPHAGEAL DILATION  08/17/2023   Procedure: DILATION, ESOPHAGUS;  Surgeon: Shila Gustav GAILS, MD;  Location: [facility] ENDOSCOPY;  Service: Gastroenterology;;   ESOPHAGOGASTRODUODENOSCOPY  02/06/2014   Small hiatal hernia. Mild gastritis. Gastric polyp status post polypectomy.   ESOPHAGOGASTRODUODENOSCOPY N/A 08/17/2023   Procedure: EGD (ESOPHAGOGASTRODUODENOSCOPY);  Surgeon: Nandigam, Kavitha V, MD;  Location: [facility] ENDOSCOPY;  Service: Gastroenterology;  Laterality: N/A;   GALLBLADDER SURGERY     INSERTION OF ILIAC STENT Bilateral 01/23/2024   Procedure: INSERTION OF BILATERAL ILIAC STENT, ARTERY;  Surgeon: Sheree Penne Bruckner, MD;  Location: [facility] OR;  Service: Vascular;  Laterality: Bilateral;   KNEE SURGERY     left   LAPAROSCOPY  1999   showed endometriosis   LOWER EXTREMITY ANGIOGRAPHY N/A 08/16/2023   Procedure: Lower Extremity Angiography;  Surgeon: Lanis Fonda BRAVO, MD;  Location: [facility] INVASIVE CV LAB;  Service: Cardiovascular;  Laterality: N/A;   LOWER EXTREMITY ANGIOGRAPHY Left 09/06/2023   Procedure: Lower Extremity Angiography;  Surgeon: Lanis Fonda BRAVO, MD;  Location: [facility] INVASIVE CV LAB;  Service: Cardiovascular;  Laterality: Left;   OOPHORECTOMY  11/15/2013   Dr Sherrine- and salpingectomy   ROTATOR CUFF REPAIR     left    TRANSMETATARSAL AMPUTATION Right 01/31/2024   Procedure: AMPUTATION, FOOT, TRANSMETATARSAL;  Surgeon: Malvin Marsa FALCON, DPM;  Location: [facility] OR;  Service: Orthopedics/Podiatry;  Laterality: Right;  pre op block per anesthesia right foot tma   ULTRASOUND GUIDANCE FOR VASCULAR ACCESS Bilateral 01/23/2024   Procedure: ULTRASOUND GUIDANCE, FOR VASCULAR ACCESS, BILATERAL FEMORAL ARTERIES;  Surgeon: Sheree Penne Bruckner, MD;  Location: [facility] OR;  Service: Vascular;  Laterality: Bilateral;    Social History:  reports that she has been smoking cigarettes. She has a 12.5 pack-year smoking history. She has never used smokeless tobacco. She reports that she does not drink alcohol and does not use drugs.   Allergies[1]  History reviewed. No pertinent family history.   Prior to Admission medications  Medication Sig Start Date End Date Taking? Authorizing Provider  albuterol  (PROVENTIL  HFA;VENTOLIN  HFA) 108 (90 BASE) MCG/ACT inhaler Inhale 2 puffs into the lungs every 6 (six) hours as needed for wheezing or shortness of breath.    [provider]  amLODipine  (NORVASC ) 5 MG tablet Take 1 tablet (5 mg total) by mouth daily. 01/24/24   Pokhrel, Laxman, MD  amoxicillin -clavulanate (AUGMENTIN ) 875-125 MG tablet Take 1 tablet by mouth 2 (two) times daily. 02/20/24   Lamount Ethan CROME, DPM  ASPIRIN  81 PO Take 81 mg by mouth daily.    [provider]  Cholecalciferol  (VITAMIN D -3 PO) Take 1 tablet by mouth daily.    [provider]  clopidogrel  (PLAVIX ) 75 MG tablet Take 75 mg by mouth daily.    [provider]  docusate sodium  (COLACE) 100 MG capsule Take 1 capsule (100 mg total) by mouth daily. 01/25/24   Pokhrel, Laxman, MD  ezetimibe  (ZETIA ) 10 MG tablet Take 10 mg by mouth daily. 06/28/23   [provider]  Fluticasone-Umeclidin-Vilant (TRELEGY ELLIPTA) 200-62.5-25 MCG/ACT AEPB Inhale 1 puff into the lungs daily in the afternoon.    [provider]   HYDROcodone -acetaminophen  (NORCO/VICODIN) 5-325 MG tablet Take 1-2 tablets by mouth every 6 (six) hours as needed for severe pain (pain score 7-10). Take one to two tablets every six to eight hours as needed for pain. 02/20/24   Lamount Ethan CROME, DPM  Insulin  Aspart FlexPen (NOVOLOG ) 100 UNIT/ML Inject 0-10 Units into the skin with breakfast, with lunch, and with evening meal. Sliding Scale 08/14/23   [provider]  ipratropium-albuterol  (DUONEB) 0.5-2.5 (3) MG/3ML SOLN Take 3 mLs by nebulization every 6 (six) hours as needed. 02/06/24   Lue Elsie BROCKS, MD  LANTUS  SOLOSTAR 100 UNIT/ML Solostar Pen Inject 12 Units into the skin daily. 02/06/24   Lue Elsie BROCKS, MD  metoprolol  tartrate (LOPRESSOR ) 25 MG tablet Take 25 mg by mouth 2 (two) times daily.    [provider]  montelukast  (SINGULAIR ) 10 MG tablet Take 1 tablet by mouth at bedtime.    [provider]  Multiple Vitamin (MULTIVITAMIN) capsule Take 1 capsule by mouth daily.    [provider]  nicotine  (NICODERM CQ  - DOSED IN MG/24 HOURS) 21 mg/24hr patch Place 1 patch (21 mg total) onto the skin daily. 01/25/24   Pokhrel, Laxman, MD  nicotine  polacrilex (NICORETTE ) 2 MG gum Take 1 each (2 mg total) by mouth as needed for smoking cessation. 01/24/24   Pokhrel, Laxman, MD  nitroGLYCERIN  (NITROSTAT ) 0.4 MG SL tablet Place 0.4 mg under the tongue every 5 (five) minutes as needed for chest pain.    [provider]  pantoprazole  (PROTONIX ) 40 MG tablet Take 1 tablet (40 mg total) by mouth 2 (two) times daily before a meal. 08/17/23   Tawkaliyar, Roya, DO  potassium chloride  (KLOR-CON ) 10 MEQ tablet Take 10 mEq by mouth daily.    [provider]  pregabalin  (LYRICA ) 50 MG capsule Take 1 capsule (50 mg total) by mouth 3 (three) times daily. 02/06/24 03/07/24  Lue Elsie BROCKS, MD  RANEXA  500 MG 12 hr tablet Take 500 mg by mouth 2 (two) times daily. 08/08/23   [provider]      Objective    Physical Exam: Vitals:   03/09/24 2145 03/09/24 2200 03/09/24 2230 03/09/24 2300  BP: (!) 180/133 (!) 178/98 (!) 172/115 (!) 176/94  Pulse: 81   75  Resp: 18 (!) 23 18 17   Temp:      TempSrc:      SpO2: 100%   100%  Weight:      Height:        General: appears to be stated age; alert, oriented Skin: warm, right foot transmetatarsal stump with surrounding swelling, hyperpigmentation, some increase in surrounding erythema.  Head:  AT/[place] Mouth:  Oral mucosa membranes appear dry, normal dentition Neck: supple; trachea midline Heart:  RRR; did not appreciate any M/R/G Lungs: CTAB, did not appreciate any wheezes, rales, or rhonchi Abdomen: + BS; soft, ND, NT Extremities: right foot transmetatarsal stump with surrounding swelling, hyperpigmentation, some increase in surrounding erythema, without crepitus.     Labs on Admission: I have personally reviewed following labs and imaging studies  CBC: Recent Labs  Lab 03/09/24 1957 03/09/24 2028  WBC 9.0  --   HGB 14.2 15.3*  15.0  HCT 38.1 45.0  44.0  MCV 88.2  --   PLT 279  --    Basic Metabolic Panel: Recent Labs  Lab 03/09/24 2028 03/09/24 2226 03/09/24 2316  NA 116*  116* 121*  --   K 8.1*  8.1* 3.1* 3.0*  CL 87* 86*  --   CO2  --  21*  --   GLUCOSE 554* 492*  --   BUN 7* 6*  --   CREATININE 0.90 1.02*  --   CALCIUM  --  9.1  --   MG  --  1.9  --    GFR: Estimated Creatinine Clearance: 61 mL/min (A) (by C-G formula based on SCr of 1.02 mg/dL (H)). Liver Function Tests: No results for input(s): AST, ALT, ALKPHOS, BILITOT, PROT, ALBUMIN  in the last 168 hours. No results for input(s): LIPASE, AMYLASE in the last 168 hours. Recent Labs  Lab 03/09/24 2226  AMMONIA 31   Coagulation Profile: No results for input(s): INR, PROTIME in the last 168 hours. Cardiac Enzymes: No results for input(s): CKTOTAL, CKMB, CKMBINDEX, TROPONINI in the last 168 hours. BNP (last  3 results) No results for input(s): PROBNP in the last 8760 hours. HbA1C: No results for input(s):  HGBA1C in the last 72 hours. CBG: Recent Labs  Lab 03/09/24 2021 03/09/24 2318  GLUCAP 554* 433*   Lipid Profile: No results for input(s): CHOL, HDL, LDLCALC, TRIG, CHOLHDL, LDLDIRECT in the last 72 hours. Thyroid Function Tests: No results for input(s): TSH, T4TOTAL, FREET4, T3FREE, THYROIDAB in the last 72 hours. Anemia Panel: No results for input(s): VITAMINB12, FOLATE, FERRITIN, TIBC, IRON, RETICCTPCT in the last 72 hours. Urine analysis:    Component Value Date/Time   COLORURINE STRAW (A) 03/09/2024 2154   APPEARANCEUR CLEAR 03/09/2024 2154   LABSPEC 1.025 03/09/2024 2154   PHURINE 6.0 03/09/2024 2154   GLUCOSEU >=500 (A) 03/09/2024 2154   HGBUR NEGATIVE 03/09/2024 2154   BILIRUBINUR NEGATIVE 03/09/2024 2154   KETONESUR NEGATIVE 03/09/2024 2154   PROTEINUR 100 (A) 03/09/2024 2154   NITRITE NEGATIVE 03/09/2024 2154   LEUKOCYTESUR NEGATIVE 03/09/2024 2154    Radiological Exams on Admission: DG Foot Complete Right Result Date: 03/09/2024 EXAM: 3 VIEW(S) XRAY OF THE RIGHT FOOT 03/09/2024 10:56:39 PM COMPARISON: Comparison with 02/20/2024. CLINICAL HISTORY: Osteo? Osteo? Swelling, pain, and oozing. FINDINGS: BONES AND JOINTS: Previous right forefoot amputation at the mid metatarsal area. Bony margins of resection appear intact. No developing cortical erosion or sclerosis. Mild callus formation at the 1st metatarsal stump. Joint spaces are normal. No acute fracture or dislocation. SOFT TISSUES: Mild soft tissue gas demonstrated in the dorsal soft tissues. Focal defect in the soft tissues over the stump, possibly ulceration and infection with gas-forming organism. No radiopaque soft tissue foreign bodies. IMPRESSION: 1. Mild soft tissue gas in the dorsal soft tissues with a focal defect over the stump, possibly representing ulceration and infection  with a gas-forming organism. 2. Status post right forefoot amputation at the mid metatarsals with intact bony margins and no radiographic evidence of osteomyelitis. Electronically signed by: Elsie Gravely MD 03/09/2024 11:04 PM EST RP Workstation: [id number]   CT Cervical Spine Wo Contrast Result Date: 03/09/2024 EXAM: CT CERVICAL SPINE WITHOUT CONTRAST 03/09/2024 10:21:34 PM TECHNIQUE: CT of the cervical spine was performed without the administration of intravenous contrast. Multiplanar reformatted images are provided for review. Automated exposure control, iterative reconstruction, and/or weight based adjustment of the mA/kV was utilized to reduce the radiation dose to as low as reasonably achievable. COMPARISON: None available. CLINICAL HISTORY: Neck trauma (Age >= 65y) FINDINGS: BONES AND ALIGNMENT: No acute fracture or traumatic malalignment. DEGENERATIVE CHANGES: No significant degenerative changes. SOFT TISSUES: Multiple bilateral thyroid nodules. These have been previously evaluated by ultrasound. No prevertebral soft tissue swelling. IMPRESSION: 1. No evidence of acute traumatic injury. Electronically signed by: Franky Crease MD 03/09/2024 10:28 PM EST RP Workstation: [id number]   CT Head Wo Contrast Result Date: 03/09/2024 EXAM: CT HEAD WITHOUT 03/09/2024 10:21:34 PM TECHNIQUE: CT of the head was performed without the administration of intravenous contrast. Automated exposure control, iterative reconstruction, and/or weight based adjustment of the mA/kV was utilized to reduce the radiation dose to as low as reasonably achievable. COMPARISON: 03/03/2023 CLINICAL HISTORY: headache, hit head on refrigerator FINDINGS: BRAIN AND VENTRICLES: No acute intracranial hemorrhage. No mass effect or midline shift. No extra-axial fluid collection. No evidence of acute infarct. No hydrocephalus. ORBITS: No acute abnormality. SINUSES AND MASTOIDS: No acute abnormality. SOFT TISSUES AND SKULL: No acute skull fracture.  No acute soft tissue abnormality. IMPRESSION: 1. No acute intracranial abnormality. Electronically signed by: Franky Crease MD 03/09/2024 10:26 PM EST RP Workstation: [id number]   DG Chest Portable 1 View Result Date: 03/09/2024 EXAM: 1 VIEW(S) XRAY OF THE  CHEST 03/09/2024 10:00:00 PM COMPARISON: 02/22/2024 CLINICAL HISTORY: cough FINDINGS: LUNGS AND PLEURA: No focal pulmonary opacity. No pleural effusion. No pneumothorax. HEART AND MEDIASTINUM: Aortic atherosclerosis. No acute abnormality of the cardiac and mediastinal silhouettes. BONES AND SOFT TISSUES: No acute osseous abnormality. IMPRESSION: 1. No active cardiopulmonary disease. Electronically signed by: Franky Crease MD 03/09/2024 10:07 PM EST RP Workstation: [id number]      Assessment/Plan   Principal Problem:   Hyperglycemia Active Problems:   DM2 (diabetes mellitus, type 2) (HCC)   Tobacco abuse   HLD (hyperlipidemia)   History of essential hypertension   Diabetic foot infection (HCC)   Lactic acidosis   Hypokalemia   Acute hyponatremia   History of COPD      # ) hyperglycemia: In the context of poorly controlled type 2 diabetes mellitus with most recent hemoglobin A1c of 8.7% on 01/19/2024, the patient presenting with progressive polyuria/polydipsia, was found to have an initial CBG of nearly 600, but without evidence of associated DKA in the absence of anion gap metabolic acidosis, We will beta-hydroxybutyrate acid level was not elevated, and VBG showed no evidence of metabolic acidemia.  Additionally, presentation does not appear consistent with HHNK, with serum osmolality found to be within normal limits.  Other, suspect uncomplicated hyperglycemia as a result of suboptimal compliance, with the patient feeling that she is independently stopped taking her basal and short acting insulin  over the course of the last week.  There is also potential hyperglycemic contribution from possible postoperative right foot infection, as further  detailed below.   Blood sugar has improved from CBG nearly 600 down to 247 with IV fluids alone.  EDP is subsequently ordered NovoLog  8 units subcu x 1 dose now.  In the absence of HHNK and DKA, and given significant improvement in blood sugar with IV fluids alone, we will refrain from initiation of insulin  drip, but rather closely monitor this evening glycemic trend via serial CBG monitoring with sliding scale insulin , as outlined below.  Of note, her history of type 2 diabetes mellitus is complicated by diabetic peripheral polyneuropathy for which she is on Lyrica  at home.  Will resume slightly less than half of her home basal insulin , as qualified below.  Plan: Acute 2-hour CBG monitoring x 2 occurrences followed by every 4 hours CBG monitoring with associated low-dose sliding scale insulin .  Lantus  5 units SQ every morning.  Resume home Lyrica .  Counseled the patient on importance of improved compliance with her outpatient insulin .  Lactated Ringer 's at 75 cc/h x 8 hours.  Repeat CMP in the morning.                   #) Infected postoperative right foot wound: Following transmetatarsal amputation of the right foot on 01/31/2024, there is concern for potential postoperative wound infection in the setting of patient's report of 3 to 4 days of worsening right stump swelling, erythema, with increased drainage from the site, with present plain films showing evidence of associated soft tissue edema as well as some potential mild soft tissue gas in the dorsal soft tissue, potentially representing underlying infection, but without overt radiographic evidence on plain film of acute osteomyelitis.  There may also be an element of wound dehiscence contributing to the subcu gas on plain film.  No crepitus to suggest necrotizing fasciitis at this time.  However, we will pursue MRI of the right foot to further evaluate, and modify existing IV antibiotics to include linezolid  and cefepime  for suspected  diabetic  right foot wound infection, with concern for acute osteomyelitis, with radiographic evidence of potential subq. Gas.  Of note, in the absence of objective fever and in the absence of leukocytosis, surgery or sepsis are not currently met.  She appears uniquely stable.  Will follow for result of MRI of the right foot further guidance to the treatment plan as well as to help guide dietary consultation.  Plan: IV fluids, as above.  Repeat lactic acid level.  Follow-up results of blood cultures x 2 in the ED this evening.  Start linezolid , cefepime .  Check CRP, ESR.  MRI of the right foot, as above.  Prn IV fentanyl .  Repeat CBC in the morning.  Will hold next dose of Plavix  pending result of MRI of the right foot.                    #) (lactic acidosis: Mildly elevated initial lactate of 2.7, with repeat trending up slightly to 2.9.  Source for patient's mild elevation in lactate is not entirely clear at this time.  As noted above, in the absence of objective fever and in the absence of leukocytosis, SIRS grazier for sepsis are not currently met, although potential postoperative right foot wound infection may be playing a contributory role leading to the patient's lactic acidosis, with clinical evidence of relative dehydration from auto diuresis in the setting of recent hyperglycemia due to noncompliance with outpatient insulin .  Renal function appears to be baseline.  Will check PTT/INR to evaluate hepatic synthetic function.  Will also add on CPK level to assess for any element of rhabdomyolysis.  Of note, her serum ethanol level was not elevated and her urinary drug screen was pan negative.  No evidence of DKA, as further established above.  In spite of her history of type 2 diabetes mellitus, does not appear to be on metformin.  Plan: Continue sliding rigors, as above.  Repeat lactic acid level.  CMP, CBC in the morning.  Check PTT, INR.  Further evaluation management of potential  postoperative right foot wound, as above.  Add on CPK level.  Monitor strict I's and O's and daily weights.                     #) Hypokalemia: presenting potassium level noted to be 3.1    Plan: monitor on tele. KCl 40 meq p.o. x 1 dose now. Add-on serum mag level. CMP, mag level in the AM.                       #) Acute hyponatremia: Since 02/01/2024, the patient has had persistent evidence of mild hyponatremia, with serum sodium values of 128-132 over the timeframe.  Given the timing relative to her right trans metatarsal amputation, there may be an element of postoperative SIADH versus element of dehydration.  Will pursue further diagnostic evaluation, as below.   Plan: Gentle IV fluids.  Check random urine sodium, random urine creatinine, random urine osmolality, TSH.  Repeat CMP in the morning.  Monitor strict I's and O's and daily weights.                     #) COPD: Documented history thereof, without clinical evidence of acute exacerbation at this time.   Outpatient respiratory regimen includes the following: Prn albuterol  inhaler.   Plan:  Prn albuterol  nebulizer. Check CMP and serum magnesium  level in the AM.                          #)  Essential Hypertension: documented h/o such, with outpatient antihypertensive regimen including amlodipine , Lopressor .  SBP's in the ED today: 130s to 170s mmHg.   Plan: Close monitoring of subsequent BP via routine VS. resume home amlodipine  and Lopressor .  Monitor on telemetry.                         #) Hyperlipidemia: documented h/o such. On Zetia  as outpatient.   Plan: continue home Zetia .                        #) Chronic tobacco abuse: Patient conveys that they are a current smoker, having smoked approximately half pack per day over the last 25 years.  Notable history in the context of her history of diabetic foot  wound.  Plan: Counseled the patient on the importance of complete smoking discontinuation.  Order placed for prn nicotine  patch for use during this hospitalization.     DVT prophylaxis: SCD's   Code Status: Full code Family Communication: none Disposition Plan: Per Rounding Team Consults called: none;  Admission status: Observation     I SPENT GREATER THAN 75  MINUTES IN CLINICAL CARE TIME/MEDICAL DECISION-MAKING IN COMPLETING THIS ADMISSION.      Eva NOVAK Dexton Zwilling DO [facility]  From 7PM - 7AM       [1]  Allergies Allergen Reactions   Crestor [Rosuvastatin] Other (See Comments)    Myalgias    Percocet [Oxycodone -Acetaminophen ] Hives   Statins Other (See Comments)    Per patient shuts down organs   Trulicity [Dulaglutide] Nausea And Vomiting, Nausea Only and Other (See Comments)    Pancreatitis    "

## 2024-03-10 NOTE — ED Notes (Signed)
With MRI

## 2024-03-10 NOTE — Consult Note (Signed)
 "  PODIATRY CONSULTATION  NAME Brandy Hall MRN [id number] DOB 09/30/1960 DOA 03/09/2024   Reason for consult:  Chief Complaint  Patient presents with   Hyperglycemia    Attending/Consulting physician:  Dr. Nydia Distance History of present illness: 64 y.o. female  with medical history significant for type 2 diabetes mellitus complicated by diabetic peripheral polyneuropathy, right-sided diabetic foot wound status post transmetatarsal amputation on 01/31/2024, COPD, central pretension, hyperlipidemia, who is admitted to [facility] on 03/09/2024 with hyperglycemia after presenting from home to [facility] ED complaining of polyuria.    In the setting of diabetic right foot infection, the patient was hospitalized at [facility] from 01/29/2024 will 12-25, during which time she underwent transmetatarsal amputation of the right foot on 01/31/2024 with Dr. Malvin of [facility].  She was subsequently discharged to SNF for temporary rehab, from which she subsequently completed her temporary rehab assignment and was discharged from SNF back to home.    The patient reports over the last 3 to 4 days that she has been experiencing increasing polyuria as well as polydipsia.  This is also been associated with mild intermittent symmetrical frontal lobe headache, without any associated visual or olfactory aura nor any associated acute focal weakness, nor any associated nausea/vomiting.    Patient's family conveys that the patient has been poorly compliant in taking her home medications since she was discharged home from SNF, including that the patient has not taken any of her Long or short acting insulin  over the course of the last week, noting that she is on Lantus  12 units SQ daily as well as NovoLog  sliding scale insulin  3 times daily with meals.  Per chart review, her most recent hemoglobin A1c level was found to be 8.7% when checked on 01/19/2024.   Aside from her polyuria, she denies any  associated dysuria or gross hematuria.  No recent subjective fever, chills, rigors, or generalized malaise.  No recent shortness of breath, cough, or chest pain.   Over the last 2 to 3 days, she has also noted increase in swelling associated with her right transmetatarsal wound, associate with some surrounding increase in erythema as well as drainage.  She noted that she has been on 2 recent oral antibiotics as an outpatient, including for her postop right foot wound.  Per chart review, it appears that she recently completed a course of Augmentin , and that she is finishing a course of Macrobid  at this time.   Brandy Hall is well known to our service.  She did undergo right transmetatarsal amputation on 01/31/2024 secondary to right foot gangrene.  During follow-up she has been noted to have delayed healing with some dehiscence of the TMA site.  This does seem to have progressed.  She continues to endorse pain to the right foot as well.  Past Medical History:  Diagnosis Date   Allergic rhinitis    COPD (chronic obstructive pulmonary disease) (HCC)    Diabetes (HCC)    GERD (gastroesophageal reflux disease)    History of colon polyps    Hypercholesteremia    Hypertension    IBS (irritable bowel syndrome)    MI (myocardial infarction) (HCC) 2016   Seasonal allergies    Trigeminal neuralgia        Latest Ref Rng & Units 03/10/2024    4:37 AM 03/09/2024    8:28 PM 03/09/2024    7:57 PM  CBC  WBC 4.0 - 10.5 K/uL 8.1   9.0   Hemoglobin 12.0 -  15.0 g/dL 86.3  84.9    84.6  85.7   Hematocrit 36.0 - 46.0 % 37.0  44.0    45.0  38.1   Platelets 150 - 400 K/uL 244   279        Latest Ref Rng & Units 03/10/2024    4:37 AM 03/09/2024   11:16 PM 03/09/2024   10:26 PM  BMP  Glucose 70 - 99 mg/dL 863   507   BUN 8 - 23 mg/dL 5   6   Creatinine 9.55 - 1.00 mg/dL 9.06   8.97   Sodium 864 - 145 mmol/L 130   121   Potassium 3.5 - 5.1 mmol/L 2.7  3.0  3.1   Chloride 98 - 111 mmol/L 92   86   CO2 22 - 32  mmol/L 24   21   Calcium 8.9 - 10.3 mg/dL 9.5   9.1       Physical Exam: Lower Extremity Exam Vasc: Nonpalpable right foot pedal pulses.  Unable to appreciate capillary refill time to the TMA stump dorsal or plantar flap.  TMA stump does appear somewhat ischemic and is cool to touch  Derm: Fibrotic eschar noted about the incision line right TMA site.  No obvious drainage.  No areas of deep probing.  No fluctuance.  No malodor.  No evidence of active drainage.  MSK:  Status post right TMA.  Right foot tender on palpation  Neuro: Decreased light touch sensation right foot    Right foot MRI IMPRESSION: 1. Surgical changes from recent transmetatarsal amputation. 2. Suspect open wounds along the dorsum of the foot in the region of the metatarsal amputation sites. 3. Abnormal complex fluid collections noted along the metatarsal amputation sites consistent with postoperative abscesses. 4. T1 and T2 signal changes at the osteotomy sites highly suspicious for osteomyelitis. 5. Diffuse subcutaneous soft tissue swelling/edema/fluid mainly along the dorsum of the foot consistent with cellulitis. 6. Myofasciitis without definite findings for pyomyositis.     Electronically Signed   By: MYRTIS Stammer M.D.   On: 03/10/2024 14:52  Labs:  White blood cell count: 8.1 A1c: 10 CRP: 1.3 Sed rate: 29 Sodium: 130 Potassium: 2.7  ASSESSMENT/PLAN OF CARE 64 y.o. female with PMHx significant for  significant for type 2 diabetes mellitus complicated by diabetic peripheral polyneuropathy, right-sided diabetic foot wound status post transmetatarsal amputation on 01/31/2024, COPD, central pretension, hyperlipidemia.  Podiatry consulted due to nonhealing right foot TMA site with concern for possible underlying infection versus ischemic process  - Clinical findings discussed with patient -Clinical findings most concerning for ischemic changes.  Appreciate vascular recommendations.  Planning for  angiogram on 1/5 -Did review MRI.  Possible findings of osteomyelitis uncertain given recent TMA as these could be postsurgical changes.  Potential fluid collections and abscesses more concerning -Likely plan for right foot I&D with bone biopsies following angiogram if pedal circulation optimized - Continue IV abx broad spectrum pending further culture data - Anticoagulation: per primary team - Wound care: Betadine wet-to-dry gauze dressing with lightly wrapped Coban and lightly wrapped Ace wrap under no compression - WB status: Nonweightbearing right foot - Will continue to follow   Thank you for the consult.  Please contact me directly with any questions or concerns.           Ethan Saddler, DPM [facility] / [facility]    [address]  [place], [place] [address]                Office [phone]  Fax [phone]     "

## 2024-03-10 NOTE — ED Notes (Signed)
 CCMD called and notified

## 2024-03-10 NOTE — Progress Notes (Addendum)
 "          [facility]                                                                              Brandy Hall, is a 64 y.o. female, DOB - 01-30-1961, FMW:[id number] Admit date - 03/09/2024    Outpatient Primary MD for the patient is Sistasis, Laurita, MD  LOS - 0  days  Chief Complaint  Patient presents with   Hyperglycemia       Brief summary   Patient is a 64 year old female with diabetes mellitus type 2,  diabetic peripheral neuropathy, right-sided diabetic foot wound status post TMA on 01/31/2024, COPD, HTN, HLP, severely noncompliant presented to ED complaining of polyuria, feeling sick, somewhat confused, dizziness, dysuria for the last 4 days..  Patient reported that she has not been taking any of her medications for last several days including insulin .  Patient was hospitalized 01/29/2024-02/06/2024 for sepsis and gangrene of toe of right foot, underwent TMA on 11/26 with podiatry, Dr. Malvin.  She was discharged to SNF, then subsequently discharged from SNF back to home.  In the last 2 to 3 days, also noted increasing swelling associated with right TMA wound, increasing erythema and drainage.  Per chart review, she had recently completed a course of Augmentin  and finishing a course of Macrobid  for UTI at this time  In ED, initial CBG 554, BHB 0.08, VBG pH 7.37.  Sodium 121, potassium 3.1, creatinine 1.0. UA negative.  UDS negative.  Initial lactate 2.7-> 2.9  Patient was started on IV antibiotics and admitted for further workup.  Assessment & Plan     Right foot diabetic foot infection (HCC) with cellulitis, in the setting of recent TMA -Concern for potential postoperative wound infection in the setting of noncompliance, hyperglycemia poor wound care - Placed on IV linezolid , cefepime  - Follow MRI right foot to rule out any underlying abscess or osteomyelitis - Podiatry consulted, discussed with Dr. Lamount, will evaluate patient -Continue to hold Plavix .  -  Vascular surgery consulted, discussed with Dr. Sheree, plan for angiogram for right renal stenosis and RLE tomorrow  Diabetes mellitus type 2, IDDM, uncontrolled with hyperglycemia, severely noncompliant - Presented with CBG of 554 - Diabetic coordinator consult, follow-up hemoglobin A1c  CBG (last 3)  Recent Labs    03/10/24 0206 03/10/24 0425 03/10/24 0752  GLUCAP 247* 140* 137*   - Placed on NovoLog  sensitive SSI every 4 hours, Lantus  7 units every 24 hours  Lactic acidosis - Patient has received 4 L IV fluid boluses, continue IVF  Hyponatremia - Likely pseudohyponatremia due to hyperglycemia.  Sodium 121 on admission, corrected sodium with CBG of 554 was 128 - Follow serum and urine osmolality, UNA, TSH - Sodium improving, 130, continue IV fluids  Severe hypokalemia - Replaced IV, p.o.  Magnesium  1.9    Tobacco abuse -Counseled on smoking cessation, continue nicotine  patch     COPD (chronic obstructive pulmonary disease) (HCC) -Currently no acute wheezing, continue albuterol  nebs as needed  Hypertension, CAD - Continue Lopressor , amlodipine , Ranexa  - Hold Plavix  for now - BP uncontrolled, resumed outpatient home meds, placed on IV hydralazine  as needed with parameters.  Estimated body mass index is 26.76 kg/m as calculated from the following:   Height as of this encounter: 5' 8 (1.727 m).   Weight as of this encounter: 79.8 kg.  Code Status: Full code DVT Prophylaxis:  SCDs Start: 03/09/24 2357   Level of Care: Level of care: Progressive Family Communication: Updated patient Disposition Plan:      Remains inpatient appropriate:      Procedures:    Consultants:   Podiatry  Antimicrobials:   Anti-infectives (From admission, onward)    Start     Dose/Rate Route Frequency Ordered Stop   03/10/24 1200  vancomycin  (VANCOREADY) IVPB 750 mg/150 mL  Status:  Discontinued        750 mg 150 mL/hr over 60 Minutes Intravenous Every 12 hours 03/09/24 2318  03/10/24 0013   03/10/24 0600  piperacillin -tazobactam (ZOSYN ) IVPB 3.375 g  Status:  Discontinued        3.375 g 12.5 mL/hr over 240 Minutes Intravenous Every 8 hours 03/09/24 2313 03/10/24 0013   03/10/24 0600  ceFEPIme  (MAXIPIME ) 2 g in sodium chloride  0.9 % 100 mL IVPB        2 g 200 mL/hr over 30 Minutes Intravenous Every 8 hours 03/10/24 0016     03/10/24 0100  linezolid  (ZYVOX ) IVPB 600 mg        600 mg 300 mL/hr over 60 Minutes Intravenous Every 12 hours 03/10/24 0013     03/10/24 0000  vancomycin  (VANCOREADY) IVPB 2000 mg/400 mL  Status:  Discontinued        2,000 mg 200 mL/hr over 120 Minutes Intravenous  Once 03/09/24 2312 03/10/24 0013   03/09/24 2315  piperacillin -tazobactam (ZOSYN ) IVPB 3.375 g        3.375 g 100 mL/hr over 30 Minutes Intravenous  Once 03/09/24 2312 03/10/24 0020          Medications  amLODipine   5 mg Oral Daily   ezetimibe   10 mg Oral Daily   insulin  aspart  0-9 Units Subcutaneous Q4H   insulin  glargine  7 Units Subcutaneous Q24H   metoprolol  tartrate  25 mg Oral BID   pregabalin   50 mg Oral TID   ranolazine   500 mg Oral BID      Subjective:   Rhaya Coale was seen and examined today.  States does not know why she stopped taking her medications, was not feeling well at home.  Currently no acute chest pain, shortness of breath, fever or chills nausea or vomiting.  Objective:   Vitals:   03/10/24 0443 03/10/24 0750 03/10/24 0751 03/10/24 0936  BP: (!) 168/94 (!) 183/95  (!) 154/100  Pulse: 71 82  89  Resp: 19 19  20   Temp: 97.9 F (36.6 C)  97.6 F (36.4 C)   TempSrc: Oral  Oral   SpO2: 97% 100%  100%  Weight:      Height:        Intake/Output Summary (Last 24 hours) at 03/10/2024 1000 Last data filed at 03/10/2024 9077 Gross per 24 hour  Intake 598.27 ml  Output --  Net 598.27 ml     Wt Readings from Last 3 Encounters:  03/09/24 79.8 kg  01/23/24 80.2 kg  09/06/23 (P) 83.9 kg     Exam General: Alert and oriented x 3,  NAD Cardiovascular: S1 S2 auscultated,  RRR Respiratory: Clear to auscultation bilaterally Gastrointestinal: Soft, nontender, nondistended, + bowel sounds Ext: no pedal edema bilaterally Neuro: No new deficits Skin: Right foot TMA stump  with some surrounding erythema, no foul-smelling odor or crepitus Psych: Normal affect      Data Reviewed:  I have personally reviewed following labs    CBC Lab Results  Component Value Date   WBC 8.1 03/10/2024   RBC 4.15 03/10/2024   HGB 13.6 03/10/2024   HCT 37.0 03/10/2024   MCV 89.2 03/10/2024   MCH 32.8 03/10/2024   PLT 244 03/10/2024   MCHC 36.8 (H) 03/10/2024   RDW 13.5 03/10/2024   LYMPHSABS 2.0 03/10/2024   MONOABS 0.8 03/10/2024   EOSABS 0.1 03/10/2024   BASOSABS 0.0 03/10/2024     Last metabolic panel Lab Results  Component Value Date   NA 130 (L) 03/10/2024   K 2.7 (LL) 03/10/2024   CL 92 (L) 03/10/2024   CO2 24 03/10/2024   BUN 5 (L) 03/10/2024   CREATININE 0.93 03/10/2024   GLUCOSE 136 (H) 03/10/2024   GFRNONAA >60 03/10/2024   CALCIUM 9.5 03/10/2024   PHOS 2.6 01/23/2024   PROT 7.9 03/10/2024   ALBUMIN  4.1 03/10/2024   LABGLOB 3.1 12/20/2023   BILITOT 0.8 03/10/2024   ALKPHOS 155 (H) 03/10/2024   AST 16 03/10/2024   ALT 7 03/10/2024   ANIONGAP 13 03/10/2024    CBG (last 3)  Recent Labs    03/10/24 0206 03/10/24 0425 03/10/24 0752  GLUCAP 247* 140* 137*      Coagulation Profile: No results for input(s): INR, PROTIME in the last 168 hours.   Radiology Studies: I have personally reviewed the imaging studies  DG Foot Complete Right Result Date: 03/09/2024 EXAM: 3 VIEW(S) XRAY OF THE RIGHT FOOT 03/09/2024 10:56:39 PM COMPARISON: Comparison with 02/20/2024. CLINICAL HISTORY: Osteo? Osteo? Swelling, pain, and oozing. FINDINGS: BONES AND JOINTS: Previous right forefoot amputation at the mid metatarsal area. Bony margins of resection appear intact. No developing cortical erosion or sclerosis. Mild  callus formation at the 1st metatarsal stump. Joint spaces are normal. No acute fracture or dislocation. SOFT TISSUES: Mild soft tissue gas demonstrated in the dorsal soft tissues. Focal defect in the soft tissues over the stump, possibly ulceration and infection with gas-forming organism. No radiopaque soft tissue foreign bodies. IMPRESSION: 1. Mild soft tissue gas in the dorsal soft tissues with a focal defect over the stump, possibly representing ulceration and infection with a gas-forming organism. 2. Status post right forefoot amputation at the mid metatarsals with intact bony margins and no radiographic evidence of osteomyelitis. Electronically signed by: Elsie Gravely MD 03/09/2024 11:04 PM EST RP Workstation: [id number]   CT Cervical Spine Wo Contrast Result Date: 03/09/2024 EXAM: CT CERVICAL SPINE WITHOUT CONTRAST 03/09/2024 10:21:34 PM TECHNIQUE: CT of the cervical spine was performed without the administration of intravenous contrast. Multiplanar reformatted images are provided for review. Automated exposure control, iterative reconstruction, and/or weight based adjustment of the mA/kV was utilized to reduce the radiation dose to as low as reasonably achievable. COMPARISON: None available. CLINICAL HISTORY: Neck trauma (Age >= 65y) FINDINGS: BONES AND ALIGNMENT: No acute fracture or traumatic malalignment. DEGENERATIVE CHANGES: No significant degenerative changes. SOFT TISSUES: Multiple bilateral thyroid nodules. These have been previously evaluated by ultrasound. No prevertebral soft tissue swelling. IMPRESSION: 1. No evidence of acute traumatic injury. Electronically signed by: Franky Crease MD 03/09/2024 10:28 PM EST RP Workstation: [id number]   CT Head Wo Contrast Result Date: 03/09/2024 EXAM: CT HEAD WITHOUT 03/09/2024 10:21:34 PM TECHNIQUE: CT of the head was performed without the administration of intravenous contrast. Automated exposure control, iterative reconstruction, and/or weight based  adjustment  of the mA/kV was utilized to reduce the radiation dose to as low as reasonably achievable. COMPARISON: 03/03/2023 CLINICAL HISTORY: headache, hit head on refrigerator FINDINGS: BRAIN AND VENTRICLES: No acute intracranial hemorrhage. No mass effect or midline shift. No extra-axial fluid collection. No evidence of acute infarct. No hydrocephalus. ORBITS: No acute abnormality. SINUSES AND MASTOIDS: No acute abnormality. SOFT TISSUES AND SKULL: No acute skull fracture. No acute soft tissue abnormality. IMPRESSION: 1. No acute intracranial abnormality. Electronically signed by: Franky Crease MD 03/09/2024 10:26 PM EST RP Workstation: [id number]   DG Chest Portable 1 View Result Date: 03/09/2024 EXAM: 1 VIEW(S) XRAY OF THE CHEST 03/09/2024 10:00:00 PM COMPARISON: 02/22/2024 CLINICAL HISTORY: cough FINDINGS: LUNGS AND PLEURA: No focal pulmonary opacity. No pleural effusion. No pneumothorax. HEART AND MEDIASTINUM: Aortic atherosclerosis. No acute abnormality of the cardiac and mediastinal silhouettes. BONES AND SOFT TISSUES: No acute osseous abnormality. IMPRESSION: 1. No active cardiopulmonary disease. Electronically signed by: Franky Crease MD 03/09/2024 10:07 PM EST RP Workstation: [id number]       Nydia Distance M.D. [facility] 03/10/2024, 10:00 AM  Available via Epic secure chat 7am-7pm After 7 pm, please refer to night coverage provider listed on amion.    "

## 2024-03-10 NOTE — ED Notes (Signed)
 Pt incontinent of urine, pt and bedding changed. Pt repositioned in bed.

## 2024-03-10 NOTE — Consult Note (Signed)
 [facility] [place] Consult    Reason for Consult: Nonhealing right TMA Referring Physician: Dr. Davia MRN #:  [id number]  History of Present Illness: This is a 64 y.o. female has a history of aortoiliac occlusive disease now status post CERAB reconstruction.  She was recently evaluated as an outpatient with CTA found to have satisfactory aortoiliac reconstruction however stenosis of the right renal artery and we have discussed right renal artery angiogram with possible stenting and also evaluation of the right lower extremity with angiography given that she last underwent lower extremity angiography in July.  She is now here with persistent UTI and nonhealing TMA to be evaluated by podiatry.  She asked to see the patient for further recommendations regarding right lower extremity blood flow.  She believes that she has continued today Plavix .  Past Medical History:  Diagnosis Date   Allergic rhinitis    COPD (chronic obstructive pulmonary disease) (HCC)    Diabetes (HCC)    GERD (gastroesophageal reflux disease)    History of colon polyps    Hypercholesteremia    Hypertension    IBS (irritable bowel syndrome)    MI (myocardial infarction) (HCC) 2016   Seasonal allergies    Trigeminal neuralgia     Past Surgical History:  Procedure Laterality Date   ABDOMINAL AORTOGRAM N/A 08/16/2023   Procedure: ABDOMINAL AORTOGRAM;  Surgeon: Lanis Fonda BRAVO, MD;  Location: [facility] INVASIVE CV LAB;  Service: Cardiovascular;  Laterality: N/A;   ABDOMINAL AORTOGRAM N/A 09/06/2023   Procedure: ABDOMINAL AORTOGRAM;  Surgeon: Lanis Fonda BRAVO, MD;  Location: [facility] INVASIVE CV LAB;  Service: Cardiovascular;  Laterality: N/A;   ABDOMINAL HYSTERECTOMY     BREAST CYST ASPIRATION Right    COLONOSCOPY  09/15/2014   [facility]. Multiple small polyps in the transverse and sigmoid colon, not removed today as the patient is on clopidogrel    ELBOW SURGERY     bilat   ESOPHAGEAL DILATION  08/17/2023   Procedure: DILATION, ESOPHAGUS;   Surgeon: Shila Gustav GAILS, MD;  Location: [facility] ENDOSCOPY;  Service: Gastroenterology;;   ESOPHAGOGASTRODUODENOSCOPY  02/06/2014   Small hiatal hernia. Mild gastritis. Gastric polyp status post polypectomy.   ESOPHAGOGASTRODUODENOSCOPY N/A 08/17/2023   Procedure: EGD (ESOPHAGOGASTRODUODENOSCOPY);  Surgeon: Nandigam, Kavitha V, MD;  Location: [facility] ENDOSCOPY;  Service: Gastroenterology;  Laterality: N/A;   GALLBLADDER SURGERY     INSERTION OF ILIAC STENT Bilateral 01/23/2024   Procedure: INSERTION OF BILATERAL ILIAC STENT, ARTERY;  Surgeon: Sheree Penne Bruckner, MD;  Location: [facility] OR;  Service: Vascular;  Laterality: Bilateral;   KNEE SURGERY     left   LAPAROSCOPY  1999   showed endometriosis   LOWER EXTREMITY ANGIOGRAPHY N/A 08/16/2023   Procedure: Lower Extremity Angiography;  Surgeon: Lanis Fonda BRAVO, MD;  Location: [facility] INVASIVE CV LAB;  Service: Cardiovascular;  Laterality: N/A;   LOWER EXTREMITY ANGIOGRAPHY Left 09/06/2023   Procedure: Lower Extremity Angiography;  Surgeon: Lanis Fonda BRAVO, MD;  Location: [facility] INVASIVE CV LAB;  Service: Cardiovascular;  Laterality: Left;   OOPHORECTOMY  11/15/2013   Dr Sherrine- and salpingectomy   ROTATOR CUFF REPAIR     left   TRANSMETATARSAL AMPUTATION Right 01/31/2024   Procedure: AMPUTATION, FOOT, TRANSMETATARSAL;  Surgeon: Malvin Marsa FALCON, DPM;  Location: [facility] OR;  Service: Orthopedics/Podiatry;  Laterality: Right;  pre op block per anesthesia right foot tma   ULTRASOUND GUIDANCE FOR VASCULAR ACCESS Bilateral 01/23/2024   Procedure: ULTRASOUND GUIDANCE, FOR VASCULAR ACCESS, BILATERAL FEMORAL ARTERIES;  Surgeon: Sheree Penne Bruckner, MD;  Location:  [facility] OR;  Service: Vascular;  Laterality: Bilateral;    Allergies[1]  Prior to Admission medications  Medication Sig Start Date End Date Taking? Authorizing Provider  amLODipine  (NORVASC ) 5 MG tablet Take 1 tablet (5 mg total) by mouth daily. 01/24/24  Yes Pokhrel, Laxman, MD  clopidogrel   (PLAVIX ) 75 MG tablet Take 75 mg by mouth daily.   Yes [provider]  ezetimibe  (ZETIA ) 10 MG tablet Take 10 mg by mouth daily. 06/28/23  Yes [provider]  MACROBID  100 MG capsule Take 1 capsule twice a day by oral route with meals for 7 days. 03/05/24  Yes [provider]  metoprolol  tartrate (LOPRESSOR ) 25 MG tablet Take 25 mg by mouth 2 (two) times daily.   Yes [provider]  potassium chloride  (KLOR-CON ) 10 MEQ tablet Take 10 mEq by mouth daily.   Yes [provider]  pregabalin  (LYRICA ) 50 MG capsule Take 1 capsule (50 mg total) by mouth 3 (three) times daily. 02/06/24 03/10/25 Yes Lue Elsie BROCKS, MD  RANEXA  500 MG 12 hr tablet Take 500 mg by mouth 2 (two) times daily. 08/08/23  Yes [provider]  albuterol  (PROVENTIL  HFA;VENTOLIN  HFA) 108 (90 BASE) MCG/ACT inhaler Inhale 2 puffs into the lungs every 6 (six) hours as needed for wheezing or shortness of breath. Patient not taking: Reported on 03/10/2024    [provider]  amoxicillin -clavulanate (AUGMENTIN ) 875-125 MG tablet Take 1 tablet by mouth 2 (two) times daily. Patient not taking: Reported on 03/10/2024 02/20/24   Lamount Ethan CROME, DPM  ASPIRIN  81 PO Take 81 mg by mouth daily. Patient not taking: Reported on 03/10/2024    [provider]  Cholecalciferol  (VITAMIN D -3 PO) Take 1 tablet by mouth daily. Patient not taking: Reported on 03/10/2024    [provider]  docusate sodium  (COLACE) 100 MG capsule Take 1 capsule (100 mg total) by mouth daily. Patient not taking: Reported on 03/10/2024 01/25/24   Sonjia Held, MD  Fluticasone-Umeclidin-Vilant (TRELEGY ELLIPTA) 200-62.5-25 MCG/ACT AEPB Inhale 1 puff into the lungs daily in the afternoon. Patient not taking: Reported on 03/10/2024    [provider]  HYDROcodone -acetaminophen  (NORCO/VICODIN) 5-325 MG tablet Take 1-2 tablets by mouth every 6 (six) hours as needed for severe pain (pain score 7-10).  Take one to two tablets every six to eight hours as needed for pain. Patient not taking: Reported on 03/10/2024 02/20/24   Lamount Ethan CROME, DPM  Insulin  Aspart FlexPen (NOVOLOG ) 100 UNIT/ML Inject 0-10 Units into the skin with breakfast, with lunch, and with evening meal. Sliding Scale Patient not taking: Reported on 03/10/2024 08/14/23   [provider]  ipratropium-albuterol  (DUONEB) 0.5-2.5 (3) MG/3ML SOLN Take 3 mLs by nebulization every 6 (six) hours as needed. Patient not taking: Reported on 03/10/2024 02/06/24   Lue Elsie BROCKS, MD  LANTUS  SOLOSTAR 100 UNIT/ML Solostar Pen Inject 12 Units into the skin daily. Patient not taking: Reported on 03/10/2024 02/06/24   Lue Elsie BROCKS, MD  montelukast  (SINGULAIR ) 10 MG tablet Take 1 tablet by mouth at bedtime. Patient not taking: Reported on 03/10/2024    [provider]  Multiple Vitamin (MULTIVITAMIN) capsule Take 1 capsule by mouth daily. Patient not taking: Reported on 03/10/2024    [provider]  nicotine  (NICODERM CQ  - DOSED IN MG/24 HOURS) 21 mg/24hr patch Place 1 patch (21 mg total) onto the skin daily. Patient not taking: Reported on 03/10/2024 01/25/24   Pokhrel, Laxman, MD  nicotine  polacrilex (NICORETTE ) 2 MG  gum Take 1 each (2 mg total) by mouth as needed for smoking cessation. Patient not taking: Reported on 03/10/2024 01/24/24   Pokhrel, Laxman, MD  nitroGLYCERIN  (NITROSTAT ) 0.4 MG SL tablet Place 0.4 mg under the tongue every 5 (five) minutes as needed for chest pain. Patient not taking: Reported on 03/10/2024    [provider]    Social History   Socioeconomic History   Marital status: Single    Spouse name: Not on file   Number of children: Not on file   Years of education: Not on file   Highest education level: Not on file  Occupational History   Not on file  Tobacco Use   Smoking status: Every Day    Current packs/day: 0.50    Average packs/day: 0.5 packs/day for 25.0 years (12.5 ttl  pk-yrs)    Types: Cigarettes   Smokeless tobacco: Never  Vaping Use   Vaping status: Never Used  Substance and Sexual Activity   Alcohol use: No   Drug use: Never   Sexual activity: Not Currently  Other Topics Concern   Not on file  Social History Narrative   Single Lives with grandchild.   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[employer]   Tobacco Use: High Risk (03/10/2024)   Patient History    Smoking Tobacco Use: Every Day    Smokeless Tobacco Use: Never    Passive Exposure: Not on file  Financial Resource Strain: Not on file  Food Insecurity: No Food Insecurity (01/30/2024)   Epic    Worried About Programme Researcher, Broadcasting/film/video in the Last Year: Never true    Ran Out of Food in the Last Year: Never true  Transportation Needs: No Transportation Needs (01/30/2024)   Epic    Lack of Transportation (Medical): No    Lack of Transportation (Non-Medical): No  Physical Activity: Not on file  Stress: Not on file  Social Connections: Unknown (01/30/2024)   Social Connection and Isolation Panel    Frequency of Communication with Friends and Family: Twice a week    Frequency of Social Gatherings with Friends and Family: Once a week    Attends Religious Services: Patient declined    Database Administrator or Organizations: Patient declined    Attends Banker Meetings: Patient declined    Marital Status: Never married  Intimate Partner Violence: Not At Risk (01/30/2024)   Epic    Fear of Current or Ex-Partner: No    Emotionally Abused: No    Physically Abused: No    Sexually Abused: No  Depression (PHQ2-9): Not on file  Alcohol Screen: Not on file  Housing: Low Risk (01/30/2024)   Epic    Unable to Pay for Housing in the Last Year: No    Number of Times Moved in the Last Year: 0    Homeless in the Last Year: No  Utilities: Not At Risk (01/30/2024)   Epic    Threatened with loss of utilities: No  Health Literacy: Not on file     History reviewed. No pertinent family  history.  Review of Systems  Constitutional: Negative.   HENT: Negative.    Eyes: Negative.   Respiratory: Negative.    Cardiovascular: Negative.   Gastrointestinal: Negative.   Genitourinary:  Positive for urgency. Negative for dysuria.  Musculoskeletal:        Nonhealing right foot surgical wound  Skin: Negative.   Neurological: Negative.   Endo/Heme/Allergies: Negative.   Psychiatric/Behavioral: Negative.  Physical Examination  Vitals:   03/10/24 0936 03/10/24 1144  BP: (!) 154/100 (!) 173/96  Pulse: 89 79  Resp: 20 19  Temp:  (!) 97.5 F (36.4 C)  SpO2: 100% 100%   Body mass index is 26.76 kg/m.  Physical Exam Cardiovascular:     Pulses:          Posterior tibial pulses are 1+ on the right side.     Comments: 1+ palpable anterior tibial pulse on the right I cannot palpate the dorsalis pedis Abdominal:     General: Abdomen is flat.  Skin:    Comments: Right TMA pictured below  Neurological:     Mental Status: She is alert.  Psychiatric:        Mood and Affect: Mood normal.      CBC    Component Value Date/Time   WBC 8.1 03/10/2024 0437   RBC 4.15 03/10/2024 0437   HGB 13.6 03/10/2024 0437   HGB 14.0 12/20/2023 1104   HCT 37.0 03/10/2024 0437   HCT 42.0 12/20/2023 1104   PLT 244 03/10/2024 0437   PLT 213 12/20/2023 1104   MCV 89.2 03/10/2024 0437   MCV 106 (H) 12/20/2023 1104   MCH 32.8 03/10/2024 0437   MCHC 36.8 (H) 03/10/2024 0437   RDW 13.5 03/10/2024 0437   RDW 13.2 12/20/2023 1104   LYMPHSABS 2.0 03/10/2024 0437   LYMPHSABS 1.0 12/20/2023 1104   MONOABS 0.8 03/10/2024 0437   EOSABS 0.1 03/10/2024 0437   EOSABS 0.0 12/20/2023 1104   BASOSABS 0.0 03/10/2024 0437   BASOSABS 0.0 12/20/2023 1104    BMET    Component Value Date/Time   NA 130 (L) 03/10/2024 0437   NA 140 12/20/2023 1104   K 2.7 (LL) 03/10/2024 0437   CL 92 (L) 03/10/2024 0437   CO2 24 03/10/2024 0437   GLUCOSE 136 (H) 03/10/2024 0437   BUN 5 (L) 03/10/2024  0437   BUN 7 (L) 12/20/2023 1104   CREATININE 0.93 03/10/2024 0437   CALCIUM 9.5 03/10/2024 0437   GFRNONAA >60 03/10/2024 0437    COAGS: No results found for: INR, PROTIME   Non-Invasive Vascular Imaging:   Recent CTA and ABIs again reviewed   ASSESSMENT/PLAN: This is a 64 y.o. female here with above-noted history.  Concern for nonhealing TMA.  She also has persistent UTI.  Thankfully creatinine remains stable despite what appears to be high-grade stenosis after stenting of the aorta on the right side.  We had previously planned for aortogram with possible right renal artery stenting and right lower extremity evaluation as an outpatient.  As patient is now admitted we will plan for angiography tomorrow from a left common femoral approach to evaluate the right renal artery as well as evaluate the right lower extremity for options to improve blood flow in hopes of healing her TMA.  All questions were answered she demonstrates good understanding and will be n.p.o. past midnight.  Leshonda Galambos C. Sheree, MD [facility] [place] Office: [phone] Pager: [phone]     [1]  Allergies Allergen Reactions   Crestor [Rosuvastatin] Other (See Comments)    Myalgias    Percocet [Oxycodone -Acetaminophen ] Hives   Statins Other (See Comments)    Per patient shuts down organs   Trulicity [Dulaglutide] Nausea And Vomiting, Nausea Only and Other (See Comments)    Pancreatitis    "

## 2024-03-11 ENCOUNTER — Encounter (HOSPITAL_COMMUNITY)
Admission: EM | Disposition: A | Discharge status: Home / Self Care | Payer: Self-pay | Source: Home / Self Care | Attending: Internal Medicine

## 2024-03-11 ENCOUNTER — Telehealth: Payer: Self-pay

## 2024-03-11 ENCOUNTER — Inpatient Hospital Stay (HOSPITAL_COMMUNITY)

## 2024-03-11 DIAGNOSIS — R519 Headache, unspecified: Secondary | ICD-10-CM | POA: Diagnosis not present

## 2024-03-11 DIAGNOSIS — L089 Local infection of the skin and subcutaneous tissue, unspecified: Secondary | ICD-10-CM | POA: Diagnosis not present

## 2024-03-11 DIAGNOSIS — E871 Hypo-osmolality and hyponatremia: Secondary | ICD-10-CM | POA: Diagnosis not present

## 2024-03-11 DIAGNOSIS — I7090 Unspecified atherosclerosis: Secondary | ICD-10-CM

## 2024-03-11 DIAGNOSIS — Z95828 Presence of other vascular implants and grafts: Secondary | ICD-10-CM

## 2024-03-11 DIAGNOSIS — I25119 Atherosclerotic heart disease of native coronary artery with unspecified angina pectoris: Secondary | ICD-10-CM

## 2024-03-11 DIAGNOSIS — Z89431 Acquired absence of right foot: Secondary | ICD-10-CM

## 2024-03-11 DIAGNOSIS — E11628 Type 2 diabetes mellitus with other skin complications: Secondary | ICD-10-CM | POA: Diagnosis not present

## 2024-03-11 DIAGNOSIS — I701 Atherosclerosis of renal artery: Secondary | ICD-10-CM

## 2024-03-11 DIAGNOSIS — T8789 Other complications of amputation stump: Secondary | ICD-10-CM

## 2024-03-11 DIAGNOSIS — J410 Simple chronic bronchitis: Secondary | ICD-10-CM | POA: Diagnosis not present

## 2024-03-11 DIAGNOSIS — Z9889 Other specified postprocedural states: Secondary | ICD-10-CM

## 2024-03-11 HISTORY — PX: ABDOMINAL AORTOGRAM W/LOWER EXTREMITY: CATH118223

## 2024-03-11 HISTORY — PX: RENAL INTERVENTION: CATH118261

## 2024-03-11 LAB — GLUCOSE, CAPILLARY
Glucose-Capillary: 324 mg/dL — ABNORMAL HIGH (ref 70–99)
Glucose-Capillary: 369 mg/dL — ABNORMAL HIGH (ref 70–99)
Glucose-Capillary: 388 mg/dL — ABNORMAL HIGH (ref 70–99)
Glucose-Capillary: 395 mg/dL — ABNORMAL HIGH (ref 70–99)
Glucose-Capillary: 448 mg/dL — ABNORMAL HIGH (ref 70–99)
Glucose-Capillary: 518 mg/dL (ref 70–99)
Glucose-Capillary: 527 mg/dL (ref 70–99)

## 2024-03-11 LAB — BASIC METABOLIC PANEL WITH GFR
Anion gap: 9 (ref 5–15)
BUN: 7 mg/dL — ABNORMAL LOW (ref 8–23)
CO2: 22 mmol/L (ref 22–32)
Calcium: 8.5 mg/dL — ABNORMAL LOW (ref 8.9–10.3)
Chloride: 101 mmol/L (ref 98–111)
Creatinine, Ser: 1.01 mg/dL — ABNORMAL HIGH (ref 0.44–1.00)
GFR, Estimated: >60 mL/min
Glucose, Bld: 175 mg/dL — ABNORMAL HIGH (ref 70–99)
Potassium: 3.5 mmol/L (ref 3.5–5.1)
Sodium: 132 mmol/L — ABNORMAL LOW (ref 135–145)

## 2024-03-11 LAB — CBC
HCT: 35.8 % — ABNORMAL LOW (ref 36.0–46.0)
HCT: 44.3 % (ref 36.0–46.0)
Hemoglobin: 12.3 g/dL (ref 12.0–15.0)
Hemoglobin: 15.1 g/dL — ABNORMAL HIGH (ref 12.0–15.0)
MCH: 31.9 pg (ref 26.0–34.0)
MCH: 31.7 pg (ref 26.0–34.0)
MCHC: 34.4 g/dL (ref 30.0–36.0)
MCHC: 34.1 g/dL (ref 30.0–36.0)
MCV: 93 fL (ref 80.0–100.0)
MCV: 92.9 fL (ref 80.0–100.0)
Platelets: 232 K/uL (ref 150–400)
Platelets: 202 K/uL (ref 150–400)
RBC: 3.85 MIL/uL — ABNORMAL LOW (ref 3.87–5.11)
RBC: 4.77 MIL/uL (ref 3.87–5.11)
RDW: 14.1 % (ref 11.5–15.5)
RDW: 14.6 % (ref 11.5–15.5)
WBC: 8.3 K/uL (ref 4.0–10.5)
WBC: 11.1 K/uL — ABNORMAL HIGH (ref 4.0–10.5)
nRBC: 0 % (ref 0.0–0.2)
nRBC: 0 % (ref 0.0–0.2)

## 2024-03-11 LAB — URINE CULTURE: Culture: 30000 — AB

## 2024-03-11 LAB — MAGNESIUM: Magnesium: 1.8 mg/dL (ref 1.7–2.4)

## 2024-03-11 LAB — CBG MONITORING, ED
Glucose-Capillary: 189 mg/dL — ABNORMAL HIGH (ref 70–99)
Glucose-Capillary: 200 mg/dL — ABNORMAL HIGH (ref 70–99)
Glucose-Capillary: 79 mg/dL (ref 70–99)

## 2024-03-11 LAB — VAS US ABI WITH/WO TBI
Left ABI: 1.03
Right ABI: 1.07

## 2024-03-11 LAB — APTT: aPTT: 25 s (ref 24–36)

## 2024-03-11 LAB — PROTIME-INR
INR: 0.9 (ref 0.8–1.2)
Prothrombin Time: 12.5 s (ref 11.4–15.2)

## 2024-03-11 LAB — CREATININE, SERUM
Creatinine, Ser: 1.01 mg/dL — ABNORMAL HIGH (ref 0.44–1.00)
GFR, Estimated: >60 mL/min

## 2024-03-11 MED ORDER — CLOPIDOGREL BISULFATE 75 MG PO TABS
75.0000 mg | ORAL_TABLET | Freq: Every day | ORAL | Status: DC
  Administered 2024-03-12 – 2024-03-15 (×4): 75 mg via ORAL
  Filled 2024-03-11 (×4): qty 1

## 2024-03-11 MED ORDER — HEPARIN SODIUM (PORCINE) 1000 UNIT/ML IJ SOLN
INTRAMUSCULAR | Status: DC | PRN
  Administered 2024-03-11: 8000 [IU] via INTRAVENOUS

## 2024-03-11 MED ORDER — SODIUM CHLORIDE 0.9 % IV SOLN: INTRAVENOUS | Status: AC

## 2024-03-11 MED ORDER — INSULIN ASPART 100 UNIT/ML IJ SOLN
INTRAMUSCULAR | Status: AC
  Filled 2024-03-11: qty 7

## 2024-03-11 MED ORDER — FENTANYL CITRATE (PF) 100 MCG/2ML IJ SOLN
INTRAMUSCULAR | Status: AC
  Filled 2024-03-11: qty 2

## 2024-03-11 MED ORDER — LABETALOL HCL 5 MG/ML IV SOLN: 10.0000 mg | INTRAVENOUS | Status: DC | PRN

## 2024-03-11 MED ORDER — INSULIN ASPART 100 UNIT/ML IJ SOLN
10.0000 [IU] | Freq: Once | INTRAMUSCULAR | Status: AC
  Administered 2024-03-11: 10 [IU] via SUBCUTANEOUS
  Filled 2024-03-11: qty 1

## 2024-03-11 MED ORDER — INSULIN ASPART 100 UNIT/ML IJ SOLN: 0.0000 [IU] | Freq: Every day | INTRAMUSCULAR | Status: DC

## 2024-03-11 MED ORDER — FENTANYL CITRATE (PF) 100 MCG/2ML IJ SOLN
INTRAMUSCULAR | Status: DC | PRN
  Administered 2024-03-11: 50 ug via INTRAVENOUS

## 2024-03-11 MED ORDER — INSULIN ASPART 100 UNIT/ML IJ SOLN: 0.0000 [IU] | INTRAMUSCULAR | Status: DC

## 2024-03-11 MED ORDER — HYDRALAZINE HCL 20 MG/ML IJ SOLN: 5.0000 mg | INTRAMUSCULAR | Status: DC | PRN

## 2024-03-11 MED ORDER — HEPARIN (PORCINE) IN NACL 1000-0.9 UT/500ML-% IV SOLN
INTRAVENOUS | Status: DC | PRN
  Administered 2024-03-11: 1000 mL

## 2024-03-11 MED ORDER — ASPIRIN 81 MG PO TBEC
81.0000 mg | DELAYED_RELEASE_TABLET | Freq: Every day | ORAL | Status: DC
  Administered 2024-03-11 – 2024-03-15 (×5): 81 mg via ORAL
  Filled 2024-03-11 (×5): qty 1

## 2024-03-11 MED ORDER — IODIXANOL 320 MG/ML IV SOLN
INTRAVENOUS | Status: DC | PRN
  Administered 2024-03-11: 65 mL

## 2024-03-11 MED ORDER — HEPARIN SODIUM (PORCINE) 1000 UNIT/ML IJ SOLN
INTRAMUSCULAR | Status: AC
  Filled 2024-03-11: qty 10

## 2024-03-11 MED ORDER — LABETALOL HCL 5 MG/ML IV SOLN
INTRAVENOUS | Status: AC
  Administered 2024-03-11: 10 mg via INTRAVENOUS
  Filled 2024-03-11: qty 4

## 2024-03-11 MED ORDER — INSULIN ASPART 100 UNIT/ML IJ SOLN: 0.0000 [IU] | Freq: Three times a day (TID) | INTRAMUSCULAR | Status: DC

## 2024-03-11 MED ORDER — SODIUM CHLORIDE 0.9% FLUSH: 3.0000 mL | INTRAVENOUS | Status: DC | PRN

## 2024-03-11 MED ORDER — SODIUM CHLORIDE 0.9 % IV SOLN: 250.0000 mL | INTRAVENOUS | Status: AC | PRN

## 2024-03-11 MED ORDER — SODIUM CHLORIDE 0.9% FLUSH
3.0000 mL | Freq: Two times a day (BID) | INTRAVENOUS | Status: DC
  Administered 2024-03-11 – 2024-03-12 (×4): 3 mL via INTRAVENOUS

## 2024-03-11 MED ORDER — MIDAZOLAM HCL 2 MG/2ML IJ SOLN
INTRAMUSCULAR | Status: AC
  Filled 2024-03-11: qty 2

## 2024-03-11 MED ORDER — HEPARIN SODIUM (PORCINE) 5000 UNIT/ML IJ SOLN
5000.0000 [IU] | Freq: Three times a day (TID) | INTRAMUSCULAR | Status: DC
  Administered 2024-03-11 – 2024-03-14 (×9): 5000 [IU] via SUBCUTANEOUS
  Filled 2024-03-11 (×8): qty 1

## 2024-03-11 MED ORDER — ASPIRIN 81 MG PO CHEW
CHEWABLE_TABLET | ORAL | Status: DC | PRN
  Administered 2024-03-11: 81 mg via ORAL

## 2024-03-11 MED ORDER — INSULIN ASPART 100 UNIT/ML IJ SOLN
0.0000 [IU] | INTRAMUSCULAR | Status: DC
  Administered 2024-03-11: 10 [IU] via SUBCUTANEOUS
  Administered 2024-03-12: 8 [IU] via SUBCUTANEOUS
  Administered 2024-03-12: 5 [IU] via SUBCUTANEOUS
  Administered 2024-03-12 (×2): 3 [IU] via SUBCUTANEOUS
  Administered 2024-03-13: 7 [IU] via SUBCUTANEOUS
  Administered 2024-03-13: 3 [IU] via SUBCUTANEOUS
  Administered 2024-03-13: 5 [IU] via SUBCUTANEOUS
  Administered 2024-03-13: 2 [IU] via SUBCUTANEOUS
  Administered 2024-03-14 (×2): 3 [IU] via SUBCUTANEOUS
  Administered 2024-03-14: 2 [IU] via SUBCUTANEOUS
  Administered 2024-03-15: 3 [IU] via SUBCUTANEOUS
  Filled 2024-03-11: qty 3
  Filled 2024-03-11 (×11): qty 1

## 2024-03-11 MED ORDER — INSULIN ASPART 100 UNIT/ML IJ SOLN
3.0000 [IU] | Freq: Three times a day (TID) | INTRAMUSCULAR | Status: DC
  Administered 2024-03-12 (×2): 3 [IU] via SUBCUTANEOUS
  Filled 2024-03-11: qty 1

## 2024-03-11 MED ORDER — INSULIN GLARGINE-YFGN 100 UNIT/ML ~~LOC~~ SOLN
10.0000 [IU] | SUBCUTANEOUS | Status: DC
  Administered 2024-03-11: 10 [IU] via SUBCUTANEOUS
  Filled 2024-03-11 (×3): qty 0.1

## 2024-03-11 MED ORDER — ASPIRIN 81 MG PO CHEW
CHEWABLE_TABLET | ORAL | Status: AC
  Filled 2024-03-11: qty 1

## 2024-03-11 NOTE — Telephone Encounter (Signed)
 Radiology called to report an addendum to patient's CT Angio Abd/Pel.  Addendum sent to Dr. Penne Colorado via Staff Message.  ADDENDUM REPORT: 03/11/2024 11:26   ADDENDUM: **An incidental finding of potential clinical significance has been found. 8 mm low-density or cystic structure along the posterior aspect of the pancreatic head. This could represent a small cystic lesion but indeterminate. Recommend follow up pre and post contrast MRI/MRCP or pancreatic protocol CT in 2 years. This recommendation follows ACR consensus guidelines: Management of Incidental Pancreatic Cysts: A White Paper of the ACR Incidental Findings Committee. J Am Coll Radiol 2017;14:911-923.**   These results will be called to the ordering clinician or representative by the Radiologist Assistant, and communication documented in the PACS or [employer].     Electronically Signed   By: Juliene Balder M.D.   On: 03/11/2024 11:26

## 2024-03-11 NOTE — Op Note (Addendum)
 "   Patient name: DIA DONATE MRN: [id number] DOB: 1960-05-11 Sex: female  03/11/2024 Pre-operative Diagnosis: Right renal artery stenosis, nonhealing right transmetatarsal amputation Post-operative diagnosis:  Same Surgeon:  Penne C. Sheree, MD Procedure Performed: 1.  Percutaneous ultrasound-guided cannulation left common femoral artery 2.  Catheter selection of aorta and aortogram 3.  Stent right renal artery with 6 x 19 mm VBX 4.  Catheter selection right common femoral artery and right lower extremity angiogram 5.  Moderate sedation with fentanyl  and Versed  for 46 minutes   Indications: 64 year old female with history of aortoiliac endovascular reconstruction for aortoiliac occlusive disease with toe ulceration.  She is now status post right transmetatarsal amputation which is nonhealing and she is indicated for right lower extremity angiography.  She also has right renal artery occlusion after stenting of the aorta and is indicated for possible renal artery stenting.  Findings: The right renal artery was occluded at the ostium and appeared to fill late and after selecting the renal artery there appeared to be thrombus in the proximal renal artery.  This was stented to 0% residual stenosis and briskly fill to completion.  The aortoiliac reconstruction including the bilateral common iliac arteries was patent as of the bilateral hypogastric arteries and bilateral external iliac arteries.  The right common femoral artery, SFA, popliteal arteries are all patent.  There is a high takeoff of the tibioperoneal trunk and there is three-vessel runoff via the anterior tibial, peroneal and posterior tibial arteries.   Procedure:  The patient was identified in the holding area and taken to room 8.  The patient was then placed supine on the table and prepped and draped in the usual sterile fashion.  A time out was called.  Ultrasound was used to evaluate the left common femoral artery which was noted to  be patent and there was a palpable pulse.  The area was anesthetized 1% lidocaine  and cannulated with a micropuncture needle followed by wire and a sheath.  An ultrasound image was saved to the permanent record and concomitantly we administered fentanyl  and Versed  as moderate sedation her vital signs were monitored throughout the case.  We placed a Bentson wire followed by 5 French sheath and Omni catheter just to the level of the top of the graft and performed aortogram.  With the above findings we placed a 6.5 French steerable sheath and the patient was fully heparinized.  We were able to select the right renal artery using Bentson wire and a steerable sheath and we confirmed intraluminal access with a quick cross catheter.  We then primarily stented the renal artery with a 6 x 19 mm VBX inflated to nominal pressure and completion demonstrated brisk flow into the renal artery and the kidney did fill briskly.  We then crossed the bifurcation using a crossover catheter and the steerable sheath and Glidewire followed by a Navi cross catheter which was placed in the right common femoral artery and we then performed right lower extremity angiography with the above findings no intervention was undertaken.  We then removed the sheath over the wire as well as the catheter and placed a 6 French sheath and then performed completion aortogram at the level of the renal arteries which demonstrated patency of the hypogastric's and the aortoiliac stenting as well as the bilateral renal arteries.  We then removed the wire and deployed a minx device.  She tolerated the procedure well and Ameeth complication.  Contrast: 65 cc     Carolann Brazell C.  Sheree, MD [facility] [place] Office: [phone] Pager: [phone]   "

## 2024-03-11 NOTE — Progress Notes (Signed)
" °  Progress Note    03/11/2024 9:55 AM * Day of Surgery *  Subjective:  no overnight issues  Vitals:   03/11/24 0800 03/11/24 0806  BP: (!) 194/88   Pulse:    Resp: 14   Temp:    SpO2:  100%    Physical Exam: Awake alert and oriented On the respirations Right TMA remains stable  CBC    Component Value Date/Time   WBC 8.3 03/11/2024 0016   RBC 3.85 (L) 03/11/2024 0016   HGB 12.3 03/11/2024 0016   HGB 14.0 12/20/2023 1104   HCT 35.8 (L) 03/11/2024 0016   HCT 42.0 12/20/2023 1104   PLT 232 03/11/2024 0016   PLT 213 12/20/2023 1104   MCV 93.0 03/11/2024 0016   MCV 106 (H) 12/20/2023 1104   MCH 31.9 03/11/2024 0016   MCHC 34.4 03/11/2024 0016   RDW 14.1 03/11/2024 0016   RDW 13.2 12/20/2023 1104   LYMPHSABS 2.0 03/10/2024 0437   LYMPHSABS 1.0 12/20/2023 1104   MONOABS 0.8 03/10/2024 0437   EOSABS 0.1 03/10/2024 0437   EOSABS 0.0 12/20/2023 1104   BASOSABS 0.0 03/10/2024 0437   BASOSABS 0.0 12/20/2023 1104    BMET    Component Value Date/Time   NA 132 (L) 03/11/2024 0016   NA 140 12/20/2023 1104   K 3.5 03/11/2024 0016   CL 101 03/11/2024 0016   CO2 22 03/11/2024 0016   GLUCOSE 175 (H) 03/11/2024 0016   BUN 7 (L) 03/11/2024 0016   BUN 7 (L) 12/20/2023 1104   CREATININE 1.01 (H) 03/11/2024 0016   CALCIUM 8.5 (L) 03/11/2024 0016   GFRNONAA >60 03/11/2024 0016    INR    Component Value Date/Time   INR 0.9 03/11/2024 0016     Intake/Output Summary (Last 24 hours) at 03/11/2024 0955 Last data filed at 03/11/2024 9371 Gross per 24 hour  Intake 2672.22 ml  Output 1800 ml  Net 872.22 ml     Assessment:  64 y.o. female is here with nonhealing right transmetatarsal amputation status post an vascular aortic reconstruction with resultant high-grade right renal artery stenosis.  Plan: Plan for aortogram with possible right renal artery stenting and right lower extremity angiography   Brandy Govea C. Sheree, MD [facility]  [place] Office: [phone] Pager: [phone]  03/11/2024 9:55 AM  "

## 2024-03-11 NOTE — ED Notes (Signed)
 Patient transported to vascular.

## 2024-03-11 NOTE — Progress Notes (Signed)
 BLE arterial duplex and ABI have been completed.   Results can be found under chart review under CV PROC. 03/11/2024 10:07 AM Naszir Cott RVT, RDMS

## 2024-03-11 NOTE — Progress Notes (Signed)
 "  PODIATRY PROGRESS NOTE Patient Name: Brandy Hall  DOB 01-05-1961 DOA 03/09/2024  Hospital Day: 3  Assessment:  64 y.o. female with PMHx significant for  significant for type 2 diabetes mellitus complicated by diabetic peripheral polyneuropathy, right-sided diabetic foot wound status post transmetatarsal amputation on 01/31/2024, COPD, central pretension, hyperlipidemia.  Podiatry consulted due to nonhealing right foot TMA site with concern for possible underlying infection versus ischemic process   WBC 8.3 CRP 1.3. ESR 29  Plan:  - NPO p MN Tuesday night for OR Wednesday for R foot TMA revision, possible wound vac application, possible graft application. She agreed to proceed.  -Appreciate vascular Dr. Sheree taking for angiogram today.    - Concern possible residual OM at amputation site in addition to necrosis/  dehiscence. More likely dehiscence related to pt continuing to smoke in post op period.  - Continue IV abx broad spectrum pending further culture data - Anticoagulation: per primary team - Wound care: Betadine wet-to-dry gauze dressing with lightly wrapped Coban and lightly wrapped Ace wrap under no compression - WB status: Nonweightbearing right foot - Will continue to follow        Brandy Hall, DPM [facility]    Subjective:  Pt seen in cath lab prior to procedure with Dr. Sheree. Discussed plan for revision of the right foot transmetatarsal amputation with bone resection/debridement  Wednesday. She is in agreement with this plan.   Objective:   Vitals:   03/11/24 0800 03/11/24 0806  BP: (!) 194/88   Pulse:    Resp: 14   Temp:    SpO2:  100%       Latest Ref Rng & Units 03/11/2024   12:16 AM 03/10/2024    4:37 AM 03/09/2024    8:28 PM  CBC  WBC 4.0 - 10.5 K/uL 8.3  8.1    Hemoglobin 12.0 - 15.0 g/dL 87.6  86.3  84.9    84.6   Hematocrit 36.0 - 46.0 % 35.8  37.0  44.0    45.0   Platelets 150 - 400 K/uL 232  244         Latest Ref Rng &  Units 03/11/2024   12:16 AM 03/10/2024    4:37 AM 03/09/2024   11:16 PM  BMP  Glucose 70 - 99 mg/dL 824  863    BUN 8 - 23 mg/dL 7  5    Creatinine 9.55 - 1.00 mg/dL 8.98  9.06    Sodium 864 - 145 mmol/L 132  130    Potassium 3.5 - 5.1 mmol/L 3.5  2.7  3.0   Chloride 98 - 111 mmol/L 101  92    CO2 22 - 32 mmol/L 22  24    Calcium 8.9 - 10.3 mg/dL 8.5  9.5      General: AAOx3, NAD  Lower Extremity Exam Vasc:     Nonpalpable right foot pedal pulses.  Unable to appreciate capillary refill time to the TMA stump dorsal or plantar flap.  TMA stump does appear somewhat ischemic and is cool to touch   Derm:    Fibrotic eschar noted about the incision line right TMA site.  No obvious drainage.  No areas of deep probing.  No fluctuance.  No malodor.  No evidence of active drainage.   MSK:     Status post right TMA.  Right foot tender on palpation   Neuro:   Decreased light touch sensation right foot  Right foot MRI IMPRESSION: 1. Surgical changes from recent transmetatarsal amputation. 2. Suspect open wounds along the dorsum of the foot in the region of the metatarsal amputation sites. 3. Abnormal complex fluid collections noted along the metatarsal amputation sites consistent with postoperative abscesses. 4. T1 and T2 signal changes at the osteotomy sites highly suspicious for osteomyelitis. 5. Diffuse subcutaneous soft tissue swelling/edema/fluid mainly along the dorsum of the foot consistent with cellulitis. 6. Myofasciitis without definite findings for pyomyositis.     Radiology:  Results reviewed. See assessment for pertinent imaging results  "

## 2024-03-11 NOTE — Plan of Care (Signed)

## 2024-03-11 NOTE — Progress Notes (Signed)
 "          [facility]                                                                              Brandy Hall, is a 64 y.o. female, DOB - 12-13-1960, FMW:[id number] Admit date - 03/09/2024    Outpatient Primary MD for the patient is Sistasis, Laurita, MD  LOS - 1  days  Chief Complaint  Patient presents with   Hyperglycemia       Brief summary   Patient is a 64 year old female with diabetes mellitus type 2,  diabetic peripheral neuropathy, right-sided diabetic foot wound status post TMA on 01/31/2024, COPD, HTN, HLP, severely noncompliant presented to ED complaining of polyuria, feeling sick, somewhat confused, dizziness, dysuria for the last 4 days..  Patient reported that she has not been taking any of her medications for last several days including insulin .  Patient was hospitalized 01/29/2024-02/06/2024 for sepsis and gangrene of toe of right foot, underwent TMA on 11/26 with podiatry, Dr. Malvin.  She was discharged to SNF, then subsequently discharged from SNF back to home.  In the last 2 to 3 days, also noted increasing swelling associated with right TMA wound, increasing erythema and drainage.  Per chart review, she had recently completed a course of Augmentin  and finishing a course of Macrobid  for UTI at this time  In ED, initial CBG 554, BHB 0.08, VBG pH 7.37.  Sodium 121, potassium 3.1, creatinine 1.0. UA negative.  UDS negative.  Initial lactate 2.7-> 2.9  Patient was started on IV antibiotics and admitted for further workup.  Assessment & Plan     Right foot diabetic foot infection (HCC) with cellulitis, in the setting of recent TMA -Concern for potential postoperative wound infection in the setting of noncompliance, hyperglycemia poor wound care - Placed on IV linezolid , cefepime  - MRI foot with postop abscesses noted along the metatarsal amputation sites, T1 and T2 signal changes at osteotomy sites highly suspicious for osteomyelitis, cellulitis - N.p.o.  after midnight on Tuesday.  Per Dr. Malvin, OR on 1/7 (Wednesday for right foot TMA revision), possible wound VAC application - Vascular surgery following, plan for angiogram for right renal stenosis and RLE tomorrow  Diabetes mellitus type 2, IDDM, uncontrolled with hyperglycemia, severely noncompliant - Presented with CBG of 554, hemoglobin A1c 10.0 - Diabetic coordinator consult placed  CBG (last 3)  Recent Labs    03/11/24 0000 03/11/24 0314 03/11/24 0731  GLUCAP 200* 189* 79   - Continue NovoLog  sensitive sliding scale insulin , Lantus  10 units daily (outpatient supposed to be on 12 units, noncompliant)   Lactic acidosis - Likely due to dehydration, no sepsis.  Improved with IV fluids  Hyponatremia - Likely pseudohyponatremia due to hyperglycemia.  Sodium 121 on admission, corrected sodium with CBG of 554 was 128 - Improving  Severe hypokalemia - K improved, replace as needed    Tobacco abuse -Counseled on smoking cessation, continue nicotine  patch     COPD (chronic obstructive pulmonary disease) (HCC) -Currently no acute wheezing, continue albuterol  nebs as needed  Hypertension, CAD - Continue Lopressor , amlodipine , Ranexa  - Hold Plavix  for now - BP uncontrolled, resumed outpatient home meds, placed  on IV hydralazine  as needed with parameters.   Estimated body mass index is 26.76 kg/m as calculated from the following:   Height as of this encounter: 5' 8 (1.727 m).   Weight as of this encounter: 79.8 kg.  Code Status: Full code DVT Prophylaxis:  SCDs Start: 03/09/24 2357   Level of Care: Level of care: Progressive Family Communication: Updated patient Disposition Plan:      Remains inpatient appropriate:      Procedures:    Consultants:   Podiatry  Antimicrobials:   Anti-infectives (From admission, onward)    Start     Dose/Rate Route Frequency Ordered Stop   03/10/24 1200  vancomycin  (VANCOREADY) IVPB 750 mg/150 mL  Status:  Discontinued         750 mg 150 mL/hr over 60 Minutes Intravenous Every 12 hours 03/09/24 2318 03/10/24 0013   03/10/24 0600  piperacillin -tazobactam (ZOSYN ) IVPB 3.375 g  Status:  Discontinued        3.375 g 12.5 mL/hr over 240 Minutes Intravenous Every 8 hours 03/09/24 2313 03/10/24 0013   03/10/24 0600  [MAR Hold]  ceFEPIme  (MAXIPIME ) 2 g in sodium chloride  0.9 % 100 mL IVPB        (MAR Hold since Mon 03/11/2024 at 0947.Hold Reason: Transfer to a Procedural area)   2 g 200 mL/hr over 30 Minutes Intravenous Every 8 hours 03/10/24 0016     03/10/24 0100  [MAR Hold]  linezolid  (ZYVOX ) IVPB 600 mg        (MAR Hold since Mon 03/11/2024 at 0947.Hold Reason: Transfer to a Procedural area)   600 mg 300 mL/hr over 60 Minutes Intravenous Every 12 hours 03/10/24 0013     03/10/24 0000  vancomycin  (VANCOREADY) IVPB 2000 mg/400 mL  Status:  Discontinued        2,000 mg 200 mL/hr over 120 Minutes Intravenous  Once 03/09/24 2312 03/10/24 0013   03/09/24 2315  piperacillin -tazobactam (ZOSYN ) IVPB 3.375 g        3.375 g 100 mL/hr over 30 Minutes Intravenous  Once 03/09/24 2312 03/10/24 0020          Medications  [MAR Hold] amLODipine   5 mg Oral Daily   [MAR Hold] ezetimibe   10 mg Oral Daily   [MAR Hold] insulin  aspart  0-9 Units Subcutaneous Q4H   [MAR Hold] insulin  glargine-yfgn  10 Units Subcutaneous Q24H   [MAR Hold] metoprolol  tartrate  25 mg Oral BID   [MAR Hold] pregabalin   50 mg Oral TID   [MAR Hold] ranolazine   500 mg Oral BID      Subjective:   Brandy Hall was seen and examined today am.  No acute complaints, awaiting vascular procedure today.  NPO.  No acute issues overnight.  No chest pain, shortness of breath, fever or chills, nausea or vomiting.  Objective:   Vitals:   03/11/24 0730 03/11/24 0745 03/11/24 0800 03/11/24 0806  BP:   (!) 194/88   Pulse:      Resp: 16 15 14    Temp:      TempSrc:      SpO2:    100%  Weight:      Height:        Intake/Output Summary (Last 24 hours) at  03/11/2024 1107 Last data filed at 03/11/2024 9371 Gross per 24 hour  Intake 2672.22 ml  Output 1800 ml  Net 872.22 ml     Wt Readings from Last 3 Encounters:  03/09/24 79.8 kg  01/23/24 80.2 kg  09/06/23 (P) 83.9 kg    Physical Exam General: Alert and oriented x 3, NAD Cardiovascular: S1 S2 clear, RRR.  Respiratory: CTAB, no wheezing, Gastrointestinal: Soft, nontender, nondistended, NBS Ext: no pedal edema bilaterally Neuro: no new deficits Skin: Right foot TMA stump as below Psych: Normal affect       Data Reviewed:  I have personally reviewed following labs    CBC Lab Results  Component Value Date   WBC 8.3 03/11/2024   RBC 3.85 (L) 03/11/2024   HGB 12.3 03/11/2024   HCT 35.8 (L) 03/11/2024   MCV 93.0 03/11/2024   MCH 31.9 03/11/2024   PLT 232 03/11/2024   MCHC 34.4 03/11/2024   RDW 14.1 03/11/2024   LYMPHSABS 2.0 03/10/2024   MONOABS 0.8 03/10/2024   EOSABS 0.1 03/10/2024   BASOSABS 0.0 03/10/2024     Last metabolic panel Lab Results  Component Value Date   NA 132 (L) 03/11/2024   K 3.5 03/11/2024   CL 101 03/11/2024   CO2 22 03/11/2024   BUN 7 (L) 03/11/2024   CREATININE 1.01 (H) 03/11/2024   GLUCOSE 175 (H) 03/11/2024   GFRNONAA >60 03/11/2024   CALCIUM 8.5 (L) 03/11/2024   PHOS 2.6 01/23/2024   PROT 7.9 03/10/2024   ALBUMIN  4.1 03/10/2024   LABGLOB 3.1 12/20/2023   BILITOT 0.8 03/10/2024   ALKPHOS 155 (H) 03/10/2024   AST 16 03/10/2024   ALT 7 03/10/2024   ANIONGAP 9 03/11/2024    CBG (last 3)  Recent Labs    03/11/24 0000 03/11/24 0314 03/11/24 0731  GLUCAP 200* 189* 79      Coagulation Profile: Recent Labs  Lab 03/11/24 0016  INR 0.9     Radiology Studies: I have personally reviewed the imaging studies  VAS US  LOWER EXTREMITY ARTERIAL DUPLEX Result Date: 03/11/2024 LOWER EXTREMITY ARTERIAL DUPLEX STUDY Patient Name:  DELORISE HUNKELE  Date of Exam:   03/11/2024 Medical Rec #: [id number]         Accession #:    [id number]  Date of Birth: 03-19-1960         Patient Gender: F Patient Age:   27 years Exam Location:  [facility] Procedure:      VAS US  LOWER EXTREMITY ARTERIAL DUPLEX Referring Phys: ETHAN SEMON --------------------------------------------------------------------------------  Indications: Peripheral artery disease. High Risk Factors: Hypertension, hyperlipidemia, Diabetes, current smoker, prior                    MI, coronary artery disease. Other Factors: RLE TMA 01/31/2024.  Vascular               01/23/2024 CERAB with aortic and bilateral iliac Interventions:         stenting. Current ABI:           1.07 / 1.03 Comparison Study: RLE 01/19/2024 & LLE 07/03/2023 Performing Technologist: Ezzie Potters RVT, RDMS  Examination Guidelines: A complete evaluation includes B-mode imaging, spectral Doppler, color Doppler, and power Doppler as needed of all accessible portions of each vessel. Bilateral testing is considered an integral part of a complete examination. Limited examinations for reoccurring indications may be performed as noted.  +-----------+--------+-----+--------+---------+--------+ RIGHT      PSV cm/sRatioStenosisWaveform Comments +-----------+--------+-----+--------+---------+--------+ CFA Prox   123                  triphasic         +-----------+--------+-----+--------+---------+--------+ CFA Distal 142  triphasic         +-----------+--------+-----+--------+---------+--------+ DFA        101                  biphasic          +-----------+--------+-----+--------+---------+--------+ SFA Prox   118                  triphasic         +-----------+--------+-----+--------+---------+--------+ SFA Mid    104                  triphasic         +-----------+--------+-----+--------+---------+--------+ SFA Distal 72                   triphasic         +-----------+--------+-----+--------+---------+--------+ POP Prox   77                   triphasic          +-----------+--------+-----+--------+---------+--------+ POP Mid    51                   triphasic         +-----------+--------+-----+--------+---------+--------+ POP Distal 64                   triphasic         +-----------+--------+-----+--------+---------+--------+ TP Trunk   77                   triphasic         +-----------+--------+-----+--------+---------+--------+ ATA Prox   89                   triphasic         +-----------+--------+-----+--------+---------+--------+ ATA Mid    63                   triphasic         +-----------+--------+-----+--------+---------+--------+ ATA Distal 70                   triphasic         +-----------+--------+-----+--------+---------+--------+ PTA Prox   83                   triphasic         +-----------+--------+-----+--------+---------+--------+ PTA Mid    75                   triphasic         +-----------+--------+-----+--------+---------+--------+ PTA Distal 77                   triphasic         +-----------+--------+-----+--------+---------+--------+ PERO Prox  56                   triphasic         +-----------+--------+-----+--------+---------+--------+ PERO Mid   47                   triphasic         +-----------+--------+-----+--------+---------+--------+ PERO Distal45                   triphasic         +-----------+--------+-----+--------+---------+--------+ DP         37                   triphasic         +-----------+--------+-----+--------+---------+--------+  +-----------+--------+-----+--------+---------+--------+  LEFT       PSV cm/sRatioStenosisWaveform Comments +-----------+--------+-----+--------+---------+--------+ CFA Prox   105                  triphasic         +-----------+--------+-----+--------+---------+--------+ CFA Distal 108                  triphasic         +-----------+--------+-----+--------+---------+--------+ DFA         56                   biphasic          +-----------+--------+-----+--------+---------+--------+ SFA Prox   89                   triphasic         +-----------+--------+-----+--------+---------+--------+ SFA Mid    81                   biphasic          +-----------+--------+-----+--------+---------+--------+ SFA Distal 51                   biphasic          +-----------+--------+-----+--------+---------+--------+ POP Prox   52                   triphasic         +-----------+--------+-----+--------+---------+--------+ POP Mid    43                   biphasic          +-----------+--------+-----+--------+---------+--------+ POP Distal 55                   biphasic          +-----------+--------+-----+--------+---------+--------+ TP Trunk   53                   triphasic         +-----------+--------+-----+--------+---------+--------+ ATA Prox   62                   triphasic         +-----------+--------+-----+--------+---------+--------+ ATA Mid    68                   triphasic         +-----------+--------+-----+--------+---------+--------+ ATA Distal 58                   triphasic         +-----------+--------+-----+--------+---------+--------+ PTA Prox   59                   biphasic          +-----------+--------+-----+--------+---------+--------+ PTA Mid    54                   biphasic          +-----------+--------+-----+--------+---------+--------+ PTA Distal 16                   biphasic          +-----------+--------+-----+--------+---------+--------+ PERO Prox  51                   biphasic          +-----------+--------+-----+--------+---------+--------+ PERO Mid   51  biphasic          +-----------+--------+-----+--------+---------+--------+ PERO Distal27                   biphasic          +-----------+--------+-----+--------+---------+--------+ DP         53                    triphasic         +-----------+--------+-----+--------+---------+--------+  Summary: Bilateral: No evidence of stenosis.  See table(s) above for measurements and observations.    Preliminary    VAS US  ABI WITH/WO TBI Result Date: 03/11/2024  LOWER EXTREMITY DOPPLER STUDY Patient Name:  NOVIS LEAGUE  Date of Exam:   03/11/2024 Medical Rec #: [id number]         Accession #:    [id number] Date of Birth: [birth year]         Patient Gender: F Patient Age:   64 years Exam Location:  [facility] Procedure:      VAS US  ABI WITH/WO TBI Referring Phys: ETHAN SEMON --------------------------------------------------------------------------------  Indications: Peripheral artery disease. High Risk Factors: Hypertension, hyperlipidemia, Diabetes, current smoker, prior                    MI, coronary artery disease. Other Factors: RLE TMA 01/31/2024.  Vascular Interventions: CERAB with aortic and bilateral iliac stenting on                         01/23/2024. Comparison Study: previous exam was on 01/30/2024 Performing Technologist: Leigh Rom RVT/RDMS  Examination Guidelines: A complete evaluation includes at minimum, Doppler waveform signals and systolic blood pressure reading at the level of bilateral brachial, anterior tibial, and posterior tibial arteries, when vessel segments are accessible. Bilateral testing is considered an integral part of a complete examination. Photoelectric Plethysmograph (PPG) waveforms and toe systolic pressure readings are included as required and additional duplex testing as needed. Limited examinations for reoccurring indications may be performed as noted.  ABI Findings: +---------+------------------+-----+---------+--------+ Right    Rt Pressure (mmHg)IndexWaveform Comment  +---------+------------------+-----+---------+--------+ Brachial 192                    triphasic         +---------+------------------+-----+---------+--------+ PTA      205                1.07 triphasic         +---------+------------------+-----+---------+--------+ DP       191               0.99 triphasic         +---------+------------------+-----+---------+--------+ Great Toe                                TMA      +---------+------------------+-----+---------+--------+ +---------+------------------+-----+---------+----------------+ Left     Lt Pressure (mmHg)IndexWaveform Comment          +---------+------------------+-----+---------+----------------+ Brachial 190                    triphasic                 +---------+------------------+-----+---------+----------------+ PTA      198               1.03 biphasic audibly biphasic +---------+------------------+-----+---------+----------------+ DP       198  1.03 triphasic                 +---------+------------------+-----+---------+----------------+ Great Toe175               0.91 Normal                    +---------+------------------+-----+---------+----------------+ +-------+-----------+-----------+------------+------------+ ABI/TBIToday's ABIToday's TBIPrevious ABIPrevious TBI +-------+-----------+-----------+------------+------------+ Right  1.07       amputation 0.96        absent       +-------+-----------+-----------+------------+------------+ Left   1.03       0.91       0.86        0.68         +-------+-----------+-----------+------------+------------+   Summary: Right: Resting right ankle-brachial index is within normal range.  Left: Resting left ankle-brachial index is within normal range. The left toe-brachial index is normal.  *See table(s) above for measurements and observations.     Preliminary    MR FOOT RIGHT WO CONTRAST Result Date: 03/10/2024 CLINICAL DATA:  Foot pain and swelling. History of transmetatarsal amputation 01/31/2024. EXAM: MRI OF THE RIGHT FOREFOOT WITHOUT CONTRAST TECHNIQUE: Multiplanar, multisequence MR imaging of the right foot  was performed. No intravenous contrast was administered. COMPARISON:  Radiographs 03/09/2024. FINDINGS: Surgical changes from recent transmetatarsal amputation. Suspect open wound along the dorsum of the foot in the region of the first metatarsal amputation. Possible second small wound noted over the fifth metatarsal amputation site. Abnormal complex fluid collection noted overlying the metatarsal amputation sites likely all communicating. This is most notable over the first metatarsal were findings are highly suspicious a focal abscess and draining open wound. This measures approximately 2.4 x 1.5 cm. Smaller fluid collections over the second, third, fourth and fifth metatarsal amputation sites. There are also T1 and T2 signal changes at the osteotomy sites highly suspicious for osteomyelitis. On the T1 weighted coronal sequence I suspect is a few dots of gas near the first metatarsal. Diffuse subcutaneous soft tissue swelling/edema/fluid mainly along the dorsum of the foot consistent with cellulitis. There is also myofasciitis without definite findings for pyomyositis. The midfoot bony structures are intact. IMPRESSION: 1. Surgical changes from recent transmetatarsal amputation. 2. Suspect open wounds along the dorsum of the foot in the region of the metatarsal amputation sites. 3. Abnormal complex fluid collections noted along the metatarsal amputation sites consistent with postoperative abscesses. 4. T1 and T2 signal changes at the osteotomy sites highly suspicious for osteomyelitis. 5. Diffuse subcutaneous soft tissue swelling/edema/fluid mainly along the dorsum of the foot consistent with cellulitis. 6. Myofasciitis without definite findings for pyomyositis. Electronically Signed   By: MYRTIS Stammer M.D.   On: 03/10/2024 14:52   DG Foot Complete Right Result Date: 03/09/2024 EXAM: 3 VIEW(S) XRAY OF THE RIGHT FOOT 03/09/2024 10:56:39 PM COMPARISON: Comparison with 02/20/2024. CLINICAL HISTORY: Osteo? Osteo?  Swelling, pain, and oozing. FINDINGS: BONES AND JOINTS: Previous right forefoot amputation at the mid metatarsal area. Bony margins of resection appear intact. No developing cortical erosion or sclerosis. Mild callus formation at the 1st metatarsal stump. Joint spaces are normal. No acute fracture or dislocation. SOFT TISSUES: Mild soft tissue gas demonstrated in the dorsal soft tissues. Focal defect in the soft tissues over the stump, possibly ulceration and infection with gas-forming organism. No radiopaque soft tissue foreign bodies. IMPRESSION: 1. Mild soft tissue gas in the dorsal soft tissues with a focal defect over the stump, possibly representing ulceration and infection with a gas-forming organism. 2. Status  post right forefoot amputation at the mid metatarsals with intact bony margins and no radiographic evidence of osteomyelitis. Electronically signed by: Elsie Gravely MD 03/09/2024 11:04 PM EST RP Workstation: [id number]   CT Cervical Spine Wo Contrast Result Date: 03/09/2024 EXAM: CT CERVICAL SPINE WITHOUT CONTRAST 03/09/2024 10:21:34 PM TECHNIQUE: CT of the cervical spine was performed without the administration of intravenous contrast. Multiplanar reformatted images are provided for review. Automated exposure control, iterative reconstruction, and/or weight based adjustment of the mA/kV was utilized to reduce the radiation dose to as low as reasonably achievable. COMPARISON: None available. CLINICAL HISTORY: Neck trauma (Age >= 65y) FINDINGS: BONES AND ALIGNMENT: No acute fracture or traumatic malalignment. DEGENERATIVE CHANGES: No significant degenerative changes. SOFT TISSUES: Multiple bilateral thyroid nodules. These have been previously evaluated by ultrasound. No prevertebral soft tissue swelling. IMPRESSION: 1. No evidence of acute traumatic injury. Electronically signed by: Franky Crease MD 03/09/2024 10:28 PM EST RP Workstation: [id number]   CT Head Wo Contrast Result Date:  03/09/2024 EXAM: CT HEAD WITHOUT 03/09/2024 10:21:34 PM TECHNIQUE: CT of the head was performed without the administration of intravenous contrast. Automated exposure control, iterative reconstruction, and/or weight based adjustment of the mA/kV was utilized to reduce the radiation dose to as low as reasonably achievable. COMPARISON: 03/03/2023 CLINICAL HISTORY: headache, hit head on refrigerator FINDINGS: BRAIN AND VENTRICLES: No acute intracranial hemorrhage. No mass effect or midline shift. No extra-axial fluid collection. No evidence of acute infarct. No hydrocephalus. ORBITS: No acute abnormality. SINUSES AND MASTOIDS: No acute abnormality. SOFT TISSUES AND SKULL: No acute skull fracture. No acute soft tissue abnormality. IMPRESSION: 1. No acute intracranial abnormality. Electronically signed by: Franky Crease MD 03/09/2024 10:26 PM EST RP Workstation: [id number]   DG Chest Portable 1 View Result Date: 03/09/2024 EXAM: 1 VIEW(S) XRAY OF THE CHEST 03/09/2024 10:00:00 PM COMPARISON: 02/22/2024 CLINICAL HISTORY: cough FINDINGS: LUNGS AND PLEURA: No focal pulmonary opacity. No pleural effusion. No pneumothorax. HEART AND MEDIASTINUM: Aortic atherosclerosis. No acute abnormality of the cardiac and mediastinal silhouettes. BONES AND SOFT TISSUES: No acute osseous abnormality. IMPRESSION: 1. No active cardiopulmonary disease. Electronically signed by: Franky Crease MD 03/09/2024 10:07 PM EST RP Workstation: [id number]       Nydia Distance M.D. [facility] 03/11/2024, 11:07 AM  Available via Epic secure chat 7am-7pm After 7 pm, please refer to night coverage provider listed on amion.    "

## 2024-03-12 ENCOUNTER — Encounter (HOSPITAL_COMMUNITY): Payer: Self-pay | Admitting: Vascular Surgery

## 2024-03-12 ENCOUNTER — Ambulatory Visit: Admitting: Podiatry

## 2024-03-12 DIAGNOSIS — R519 Headache, unspecified: Secondary | ICD-10-CM

## 2024-03-12 DIAGNOSIS — E11628 Type 2 diabetes mellitus with other skin complications: Secondary | ICD-10-CM | POA: Diagnosis not present

## 2024-03-12 DIAGNOSIS — L089 Local infection of the skin and subcutaneous tissue, unspecified: Secondary | ICD-10-CM | POA: Diagnosis not present

## 2024-03-12 LAB — URINALYSIS, ROUTINE W REFLEX MICROSCOPIC
Bilirubin Urine: NEGATIVE
Glucose, UA: >=500 mg/dL — AB
Hgb urine dipstick: NEGATIVE
Ketones, ur: NEGATIVE mg/dL
Leukocytes,Ua: NEGATIVE
Nitrite: NEGATIVE
Protein, ur: 100 mg/dL — AB
Specific Gravity, Urine: 1.023 (ref 1.005–1.030)
pH: 6 (ref 5.0–8.0)

## 2024-03-12 LAB — CBC
HCT: 34.1 % — ABNORMAL LOW (ref 36.0–46.0)
Hemoglobin: 11.6 g/dL — ABNORMAL LOW (ref 12.0–15.0)
MCH: 32.1 pg (ref 26.0–34.0)
MCHC: 34 g/dL (ref 30.0–36.0)
MCV: 94.5 fL (ref 80.0–100.0)
Platelets: 174 K/uL (ref 150–400)
RBC: 3.61 MIL/uL — ABNORMAL LOW (ref 3.87–5.11)
RDW: 14.6 % (ref 11.5–15.5)
WBC: 11.7 K/uL — ABNORMAL HIGH (ref 4.0–10.5)
nRBC: 0 % (ref 0.0–0.2)

## 2024-03-12 LAB — BASIC METABOLIC PANEL WITH GFR
Anion gap: 11 (ref 5–15)
BUN: 13 mg/dL (ref 8–23)
CO2: 19 mmol/L — ABNORMAL LOW (ref 22–32)
Calcium: 8.6 mg/dL — ABNORMAL LOW (ref 8.9–10.3)
Chloride: 97 mmol/L — ABNORMAL LOW (ref 98–111)
Creatinine, Ser: 1.57 mg/dL — ABNORMAL HIGH (ref 0.44–1.00)
GFR, Estimated: 36 mL/min — ABNORMAL LOW
Glucose, Bld: 202 mg/dL — ABNORMAL HIGH (ref 70–99)
Potassium: 3.5 mmol/L (ref 3.5–5.1)
Sodium: 126 mmol/L — ABNORMAL LOW (ref 135–145)

## 2024-03-12 LAB — GLUCOSE, CAPILLARY
Glucose-Capillary: 187 mg/dL — ABNORMAL HIGH (ref 70–99)
Glucose-Capillary: 194 mg/dL — ABNORMAL HIGH (ref 70–99)
Glucose-Capillary: 209 mg/dL — ABNORMAL HIGH (ref 70–99)
Glucose-Capillary: 209 mg/dL — ABNORMAL HIGH (ref 70–99)
Glucose-Capillary: 273 mg/dL — ABNORMAL HIGH (ref 70–99)
Glucose-Capillary: 39 mg/dL — CL (ref 70–99)
Glucose-Capillary: 89 mg/dL (ref 70–99)

## 2024-03-12 LAB — SODIUM, URINE, RANDOM: Sodium, Ur: <30 mmol/L

## 2024-03-12 LAB — OSMOLALITY: Osmolality: 280 mosm/kg (ref 275–295)

## 2024-03-12 LAB — OSMOLALITY, URINE: Osmolality, Ur: 335 mosm/kg (ref 300–900)

## 2024-03-12 MED ORDER — LACTATED RINGERS IV SOLN: INTRAVENOUS | Status: DC

## 2024-03-12 MED ORDER — SODIUM CHLORIDE 0.9 % IV SOLN: INTRAVENOUS | Status: AC

## 2024-03-12 MED ORDER — INSULIN GLARGINE-YFGN 100 UNIT/ML ~~LOC~~ SOLN
12.0000 [IU] | Freq: Every day | SUBCUTANEOUS | Status: DC
  Administered 2024-03-13 – 2024-03-14 (×2): 12 [IU] via SUBCUTANEOUS
  Filled 2024-03-12 (×4): qty 0.12

## 2024-03-12 MED ORDER — MIDODRINE HCL 5 MG PO TABS
10.0000 mg | ORAL_TABLET | ORAL | Status: AC
  Administered 2024-03-12: 10 mg via ORAL
  Filled 2024-03-12: qty 2

## 2024-03-12 MED ORDER — ALBUMIN HUMAN 25 % IV SOLN
25.0000 g | INTRAVENOUS | Status: AC
  Administered 2024-03-12: 25 g via INTRAVENOUS
  Filled 2024-03-12: qty 100

## 2024-03-12 MED ORDER — INSULIN ASPART 100 UNIT/ML IJ SOLN
4.0000 [IU] | Freq: Three times a day (TID) | INTRAMUSCULAR | Status: DC
  Administered 2024-03-12 – 2024-03-15 (×7): 4 [IU] via SUBCUTANEOUS
  Filled 2024-03-12 (×7): qty 1

## 2024-03-12 MED ORDER — PREGABALIN 25 MG PO CAPS
50.0000 mg | ORAL_CAPSULE | Freq: Two times a day (BID) | ORAL | Status: DC
  Administered 2024-03-12 – 2024-03-15 (×6): 50 mg via ORAL
  Filled 2024-03-12 (×6): qty 2

## 2024-03-12 MED ORDER — LACTATED RINGERS IV BOLUS
1000.0000 mL | INTRAVENOUS | Status: AC
  Administered 2024-03-12: 1000 mL via INTRAVENOUS

## 2024-03-12 MED ORDER — DEXTROSE 50 % IV SOLN: 1.0000 | INTRAVENOUS | Status: DC | PRN

## 2024-03-12 MED ORDER — SODIUM CHLORIDE 0.9 % IV SOLN
2.0000 g | Freq: Two times a day (BID) | INTRAVENOUS | Status: DC
  Administered 2024-03-12 – 2024-03-13 (×4): 2 g via INTRAVENOUS
  Filled 2024-03-12 (×4): qty 12.5

## 2024-03-12 NOTE — Progress Notes (Signed)
 PHARMACY NOTE:  ANTIMICROBIAL RENAL DOSAGE ADJUSTMENT  Current antimicrobial regimen includes a mismatch between antimicrobial dosage and estimated renal function.  As per policy approved by the Pharmacy & Therapeutics and Medical Executive Committees, the antimicrobial dosage will be adjusted accordingly.  Current antimicrobial dosage:  Cefepime  2g IV every 8 hours  Indication: Osteomyelitis  Renal Function:  Estimated Creatinine Clearance: 39.6 mL/min (A) (by C-G formula based on SCr of 1.57 mg/dL (H)). []      On intermittent HD, scheduled: []      On CRRT    Antimicrobial dosage has been changed to:    Cefepime  IV every 12 hours   Additional comments:   Thank you for allowing pharmacy to be a part of this patient's care.  Lynwood Poplar, PharmD, BCPS Clinical Pharmacist 03/12/2024 5:21 AM

## 2024-03-12 NOTE — Progress Notes (Signed)
 Hypoglycemic Event  CBG: 39  Treatment: 8 oz juice/soda  Symptoms: None  Follow-up CBG: Time:2107  CBG Result:89  Possible Reasons for Event: Inadequate meal intake  Comments/MD notified:Dr. Sundil, awaiting response    [place], Arland Rana

## 2024-03-12 NOTE — Plan of Care (Addendum)
 RN reported that patient blood pressure is soft 83/59 and Rona has 150 cc of urine output in last 12-hour shift also having whitish vaginal discharge.  During initial admission patient found hypertensive received multiple doses of IV labetalol  and hydralazine  currently on 3 blood pressure regimen.  - Holding Lopressor  and amlodipine . - Giving 1 L of LR bolus and albumin  25 g.  Followed by starting maintenance fluid LR 125 cc/h. -Checking UA.  Joeanna Howdyshell, MD [facility] 03/12/2024, 2:10 AM

## 2024-03-12 NOTE — Progress Notes (Signed)
 Patient has not been able to urinate since procedure. Patient maybe had about 150 in the cannister from the purewick.   Patient was bladder scan twice. 107/54 respectively.   Patient has no urge to urinate. Patient denies any discomfort.   Questioning problem with stent???    Paged on call vascular PA. Awaiting response.

## 2024-03-12 NOTE — Progress Notes (Signed)
 Patient expressed that she does not want foot surgery at this time. MD notified

## 2024-03-12 NOTE — Progress Notes (Addendum)
 "          [facility]                                                                              Brandy Hall, is a 64 y.o. female, DOB - [birth year], FMW:[id number] Admit date - 03/09/2024    Outpatient Primary MD for the patient is Sistasis, Laurita, MD  LOS - 2  days  Chief Complaint  Patient presents with   Hyperglycemia       Brief summary   Patient is a 64 year old female with diabetes mellitus type 2,  diabetic peripheral neuropathy, right-sided diabetic foot wound status post TMA on 01/31/2024, COPD, HTN, HLP, severely noncompliant presented to ED complaining of polyuria, feeling sick, somewhat confused, dizziness, dysuria for the last 4 days..  Patient reported that she has not been taking any of her medications for last several days including insulin .  Patient was hospitalized 01/29/2024-02/06/2024 for sepsis and gangrene of toe of right foot, underwent TMA on 11/26 with podiatry, Dr. Malvin.  She was discharged to SNF, then subsequently discharged from SNF back to home.  In the last 2 to 3 days, also noted increasing swelling associated with right TMA wound, increasing erythema and drainage.  Per chart review, she had recently completed a course of Augmentin  and finishing a course of Macrobid  for UTI at this time  In ED, initial CBG 554, BHB 0.08, VBG pH 7.37.  Sodium 121, potassium 3.1, creatinine 1.0. UA negative.  UDS negative.  Initial lactate 2.7-> 2.9  Patient was started on IV antibiotics and admitted for further workup.  Assessment & Plan     Right foot diabetic foot infection (HCC) with cellulitis, in the setting of recent TMA -Concern for potential postoperative wound infection in the setting of noncompliance, hyperglycemia poor wound care - MRI foot with postop abscesses noted along the metatarsal amputation sites, T1 and T2 signal changes at osteotomy sites highly suspicious for osteomyelitis, cellulitis -Vascular surgery consulted, underwent  angiogram for right renal stenosis and already on 1/5 -Podiatry following, n.p.o. after midnight, plan for right foot TMA revision, possible wound VAC application tomorrow 1/7 - Continue IV linezolid , cefepime , follow cultures    Diabetes mellitus type 2, IDDM, uncontrolled with hyperglycemia, severely noncompliant - Presented with CBG of 554, hemoglobin A1c 10.0 - Diabetic coordinator consult placed  CBG (last 3)  Recent Labs    03/11/24 2347 03/12/24 0419 03/12/24 0933  GLUCAP 369* 187* 209*   - Increased Lantus  to 12 units daily, increase to NovoLog  meal coverage 4 units 3 times daily AC, moderate SSI  - Diabetic coordinator consulted   Lactic acidosis - Likely due to dehydration, no sepsis.  Improved with IV fluids  Hyponatremia - Likely pseudohyponatremia due to hyperglycemia on admission, NA 121.   - Sodium again down to 126 today with AKI, obtain serum osmolality, urine osmolality, UA, placed on NaCl IVF  Acute kidney injury -  creatinine 1.57 today - Placed on IV fluid hydration, renal dose medications.  Severe hypokalemia - Improved, replace as needed    Tobacco abuse -Counseled on smoking cessation, continue nicotine  patch     COPD (chronic obstructive pulmonary disease) (HCC) -Currently  no acute wheezing, continue albuterol  nebs as needed  Hypertension, CAD - Hold Plavix  for now - BP soft,  Lopressor , amlodipine  held   Estimated body mass index is 26.51 kg/m as calculated from the following:   Height as of this encounter: 5' 8 (1.727 m).   Weight as of this encounter: 79.1 kg.  Code Status: Full code DVT Prophylaxis:  heparin  injection 5,000 Units Start: 03/11/24 2200 SCDs Start: 03/09/24 2357   Level of Care: Level of care: Progressive Family Communication: Updated patient Disposition Plan:      Remains inpatient appropriate:      Procedures:  1.  Percutaneous ultrasound-guided cannulation left common femoral artery 2.  Catheter selection  of aorta and aortogram 3.  Stent right renal artery with 6 x 19 mm VBX 4.  Catheter selection right common femoral artery and right lower extremity angiogram 5.  Moderate sedation with fentanyl  and Versed  for 46 minutes  Consultants:   Podiatry Vascular surgery  Antimicrobials:   Anti-infectives (From admission, onward)    Start     Dose/Rate Route Frequency Ordered Stop   03/12/24 1000  ceFEPIme  (MAXIPIME ) 2 g in sodium chloride  0.9 % 100 mL IVPB        2 g 200 mL/hr over 30 Minutes Intravenous Every 12 hours 03/12/24 0520     03/10/24 1200  vancomycin  (VANCOREADY) IVPB 750 mg/150 mL  Status:  Discontinued        750 mg 150 mL/hr over 60 Minutes Intravenous Every 12 hours 03/09/24 2318 03/10/24 0013   03/10/24 0600  piperacillin -tazobactam (ZOSYN ) IVPB 3.375 g  Status:  Discontinued        3.375 g 12.5 mL/hr over 240 Minutes Intravenous Every 8 hours 03/09/24 2313 03/10/24 0013   03/10/24 0600  ceFEPIme  (MAXIPIME ) 2 g in sodium chloride  0.9 % 100 mL IVPB  Status:  Discontinued        2 g 200 mL/hr over 30 Minutes Intravenous Every 8 hours 03/10/24 0016 03/12/24 0520   03/10/24 0100  linezolid  (ZYVOX ) IVPB 600 mg        600 mg 300 mL/hr over 60 Minutes Intravenous Every 12 hours 03/10/24 0013     03/10/24 0000  vancomycin  (VANCOREADY) IVPB 2000 mg/400 mL  Status:  Discontinued        2,000 mg 200 mL/hr over 120 Minutes Intravenous  Once 03/09/24 2312 03/10/24 0013   03/09/24 2315  piperacillin -tazobactam (ZOSYN ) IVPB 3.375 g        3.375 g 100 mL/hr over 30 Minutes Intravenous  Once 03/09/24 2312 03/10/24 0020          Medications  aspirin  EC  81 mg Oral Daily   clopidogrel   75 mg Oral Q breakfast   ezetimibe   10 mg Oral Daily   heparin   5,000 Units Subcutaneous Q8H   insulin  aspart  0-15 Units Subcutaneous Q4H   insulin  aspart  3 Units Subcutaneous TID WC   insulin  glargine-yfgn  12 Units Subcutaneous QHS   pregabalin   50 mg Oral TID   ranolazine   500 mg Oral BID    sodium chloride  flush  3 mL Intravenous Q12H      Subjective:   Brandy Hall was seen and examined today am.  No acute complaints.  No chest pain, shortness of breath fever chills nausea vomiting.  Objective:   Vitals:   03/12/24 0622 03/12/24 0800 03/12/24 0928 03/12/24 1000  BP:  122/73 (!) 87/55 92/66  Pulse:   77 63  Resp: 19 16 15 19   Temp:   97.6 F (36.4 C)   TempSrc:   Oral   SpO2:   97% 98%  Weight: 79.1 kg     Height:        Intake/Output Summary (Last 24 hours) at 03/12/2024 1200 Last data filed at 03/12/2024 0449 Gross per 24 hour  Intake 1736.86 ml  Output --  Net 1736.86 ml     Wt Readings from Last 3 Encounters:  03/12/24 79.1 kg  01/23/24 80.2 kg  09/06/23 (P) 83.9 kg   Physical Exam General: Alert and oriented x 3, NAD Cardiovascular: S1 S2 clear, RRR.  Respiratory: CTAB Gastrointestinal: Soft, nontender, nondistended, NBS Ext: no pedal edema bilaterally Neuro: no new deficits Skin: Right foot TMA stump as below Psych: Normal affect       Data Reviewed:  I have personally reviewed following labs    CBC Lab Results  Component Value Date   WBC 11.7 (H) 03/12/2024   RBC 3.61 (L) 03/12/2024   HGB 11.6 (L) 03/12/2024   HCT 34.1 (L) 03/12/2024   MCV 94.5 03/12/2024   MCH 32.1 03/12/2024   PLT 174 03/12/2024   MCHC 34.0 03/12/2024   RDW 14.6 03/12/2024   LYMPHSABS 2.0 03/10/2024   MONOABS 0.8 03/10/2024   EOSABS 0.1 03/10/2024   BASOSABS 0.0 03/10/2024     Last metabolic panel Lab Results  Component Value Date   NA 126 (L) 03/12/2024   K 3.5 03/12/2024   CL 97 (L) 03/12/2024   CO2 19 (L) 03/12/2024   BUN 13 03/12/2024   CREATININE 1.57 (H) 03/12/2024   GLUCOSE 202 (H) 03/12/2024   GFRNONAA 36 (L) 03/12/2024   CALCIUM 8.6 (L) 03/12/2024   PHOS 2.6 01/23/2024   PROT 7.9 03/10/2024   ALBUMIN  4.1 03/10/2024   LABGLOB 3.1 12/20/2023   BILITOT 0.8 03/10/2024   ALKPHOS 155 (H) 03/10/2024   AST 16 03/10/2024   ALT 7  03/10/2024   ANIONGAP 11 03/12/2024    CBG (last 3)  Recent Labs    03/11/24 2347 03/12/24 0419 03/12/24 0933  GLUCAP 369* 187* 209*      Coagulation Profile: Recent Labs  Lab 03/11/24 0016  INR 0.9     Radiology Studies: I have personally reviewed the imaging studies  PERIPHERAL VASCULAR CATHETERIZATION Result Date: 03/11/2024 Images from the original result were not included. Patient name: KIRSTI MCALPINE MRN: [id number] DOB: [birth year] Sex: female 03/11/2024 Pre-operative Diagnosis: Right renal artery stenosis, nonhealing right transmetatarsal amputation Post-operative diagnosis:  Same Surgeon:  Penne C. Sheree, MD Procedure Performed: 1.  Percutaneous ultrasound-guided cannulation left common femoral artery 2.  Catheter selection of aorta and aortogram 3.  Stent right renal artery with 6 x 19 mm VBX 4.  Catheter selection right common femoral artery and right lower extremity angiogram 5.  Moderate sedation with fentanyl  and Versed  for 46 minutes Indications: 64 year old female with history of aortoiliac endovascular reconstruction for aortoiliac occlusive disease with toe ulceration.  She is now status post right transmetatarsal amputation which is nonhealing and she is indicated for right lower extremity angiography.  She also has right renal artery occlusion after stenting of the aorta and is indicated for possible renal artery stenting. Findings: The right renal artery was occluded at the ostium and appeared to fill late and after selecting the renal artery there appeared to be thrombus in the proximal renal artery.  This was stented to 0% residual stenosis and briskly fill to completion.  The aortoiliac reconstruction including the bilateral common iliac arteries was patent as of the bilateral hypogastric arteries and bilateral external iliac arteries.  The right common femoral artery, SFA, popliteal arteries are all patent.  There is a high takeoff of the tibioperoneal trunk and there is  three-vessel runoff via the anterior tibial, peroneal and posterior tibial arteries.  Procedure:  The patient was identified in the holding area and taken to room 8.  The patient was then placed supine on the table and prepped and draped in the usual sterile fashion.  A time out was called.  Ultrasound was used to evaluate the left common femoral artery which was noted to be patent and there was a palpable pulse.  The area was anesthetized 1% lidocaine  and cannulated with a micropuncture needle followed by wire and a sheath.  An ultrasound image was saved to the permanent record and concomitantly we administered fentanyl  and Versed  as moderate sedation her vital signs were monitored throughout the case.  We placed a Bentson wire followed by 5 French sheath and Omni catheter just to the level of the top of the graft and performed aortogram.  With the above findings we placed a 6.5 French steerable sheath and the patient was fully heparinized.  We were able to select the right renal artery using Bentson wire and a steerable sheath and we confirmed intraluminal access with a quick cross catheter.  We then primarily stented the renal artery with a 6 x 19 mm VBX inflated to nominal pressure and completion demonstrated brisk flow into the renal artery and the kidney did fill briskly.  We then crossed the bifurcation using a crossover catheter and the steerable sheath and Glidewire followed by a Navi cross catheter we then performed right lower extremity angiography with the above findings no intervention was undertaken.  We then removed the sheath over the wire as well as the catheter and placed a 6 French sheath and then performed completion aortogram at the level of the renal arteries which demonstrated patency of the hypogastric's and the aortoiliac stenting as well as the bilateral renal arteries.  We then removed the wire and deployed a minx device.  She tolerated the procedure well and Ameeth complication. Contrast:  65 cc Brandon C. Sheree, MD [facility] Office: [phone] Pager: [phone]   VAS US  LOWER EXTREMITY ARTERIAL DUPLEX Result Date: 03/11/2024 LOWER EXTREMITY ARTERIAL DUPLEX STUDY Patient Name:  KIMBA LOTTES  Date of Exam:   03/11/2024 Medical Rec #: [id number]         Accession #:    [id number] Date of Birth: Aug 29, 1960         Patient Gender: F Patient Age:   13 years Exam Location:  [facility] Procedure:      VAS US  LOWER EXTREMITY ARTERIAL DUPLEX Referring Phys: ETHAN SEMON --------------------------------------------------------------------------------  Indications: Peripheral artery disease. High Risk Factors: Hypertension, hyperlipidemia, Diabetes, current smoker, prior                    MI, coronary artery disease. Other Factors: RLE TMA 01/31/2024.  Vascular               01/23/2024 CERAB with aortic and bilateral iliac Interventions:         stenting. Current ABI:           1.07 / 1.03 Comparison Study: RLE 01/19/2024 & LLE 07/03/2023 Performing Technologist: Ezzie Potters RVT, RDMS  Examination Guidelines: A complete evaluation  includes B-mode imaging, spectral Doppler, color Doppler, and power Doppler as needed of all accessible portions of each vessel. Bilateral testing is considered an integral part of a complete examination. Limited examinations for reoccurring indications may be performed as noted.  +-----------+--------+-----+--------+---------+--------+ RIGHT      PSV cm/sRatioStenosisWaveform Comments +-----------+--------+-----+--------+---------+--------+ CFA Prox   123                  triphasic         +-----------+--------+-----+--------+---------+--------+ CFA Distal 142                  triphasic         +-----------+--------+-----+--------+---------+--------+ DFA        101                  biphasic          +-----------+--------+-----+--------+---------+--------+ SFA Prox   118                  triphasic          +-----------+--------+-----+--------+---------+--------+ SFA Mid    104                  triphasic         +-----------+--------+-----+--------+---------+--------+ SFA Distal 72                   triphasic         +-----------+--------+-----+--------+---------+--------+ POP Prox   77                   triphasic         +-----------+--------+-----+--------+---------+--------+ POP Mid    51                   triphasic         +-----------+--------+-----+--------+---------+--------+ POP Distal 64                   triphasic         +-----------+--------+-----+--------+---------+--------+ TP Trunk   77                   triphasic         +-----------+--------+-----+--------+---------+--------+ ATA Prox   89                   triphasic         +-----------+--------+-----+--------+---------+--------+ ATA Mid    63                   triphasic         +-----------+--------+-----+--------+---------+--------+ ATA Distal 70                   triphasic         +-----------+--------+-----+--------+---------+--------+ PTA Prox   83                   triphasic         +-----------+--------+-----+--------+---------+--------+ PTA Mid    75                   triphasic         +-----------+--------+-----+--------+---------+--------+ PTA Distal 77                   triphasic         +-----------+--------+-----+--------+---------+--------+ PERO Prox  56                   triphasic         +-----------+--------+-----+--------+---------+--------+  PERO Mid   47                   triphasic         +-----------+--------+-----+--------+---------+--------+ PERO Distal45                   triphasic         +-----------+--------+-----+--------+---------+--------+ DP         37                   triphasic         +-----------+--------+-----+--------+---------+--------+  +-----------+--------+-----+--------+---------+--------+ LEFT        PSV cm/sRatioStenosisWaveform Comments +-----------+--------+-----+--------+---------+--------+ CFA Prox   105                  triphasic         +-----------+--------+-----+--------+---------+--------+ CFA Distal 108                  triphasic         +-----------+--------+-----+--------+---------+--------+ DFA        56                   biphasic          +-----------+--------+-----+--------+---------+--------+ SFA Prox   89                   triphasic         +-----------+--------+-----+--------+---------+--------+ SFA Mid    81                   biphasic          +-----------+--------+-----+--------+---------+--------+ SFA Distal 51                   biphasic          +-----------+--------+-----+--------+---------+--------+ POP Prox   52                   triphasic         +-----------+--------+-----+--------+---------+--------+ POP Mid    43                   biphasic          +-----------+--------+-----+--------+---------+--------+ POP Distal 55                   biphasic          +-----------+--------+-----+--------+---------+--------+ TP Trunk   53                   triphasic         +-----------+--------+-----+--------+---------+--------+ ATA Prox   62                   triphasic         +-----------+--------+-----+--------+---------+--------+ ATA Mid    68                   triphasic         +-----------+--------+-----+--------+---------+--------+ ATA Distal 58                   triphasic         +-----------+--------+-----+--------+---------+--------+ PTA Prox   59                   biphasic          +-----------+--------+-----+--------+---------+--------+ PTA Mid    54  biphasic          +-----------+--------+-----+--------+---------+--------+ PTA Distal 16                   biphasic          +-----------+--------+-----+--------+---------+--------+ PERO Prox  51                    biphasic          +-----------+--------+-----+--------+---------+--------+ PERO Mid   51                   biphasic          +-----------+--------+-----+--------+---------+--------+ PERO Distal27                   biphasic          +-----------+--------+-----+--------+---------+--------+ DP         53                   triphasic         +-----------+--------+-----+--------+---------+--------+  Summary: Bilateral: No evidence of stenosis.  See table(s) above for measurements and observations. Electronically signed by Lonni Gaskins MD on 03/11/2024 at 11:35:27 AM.    Final    VAS US  ABI WITH/WO TBI Result Date: 03/11/2024  LOWER EXTREMITY DOPPLER STUDY Patient Name:  JING HOWATT  Date of Exam:   03/11/2024 Medical Rec #: [id number]         Accession #:    [id number] Date of Birth: 1960-07-08         Patient Gender: F Patient Age:   30 years Exam Location:  [facility] Procedure:      VAS US  ABI WITH/WO TBI Referring Phys: ETHAN SEMON --------------------------------------------------------------------------------  Indications: Peripheral artery disease. High Risk Factors: Hypertension, hyperlipidemia, Diabetes, current smoker, prior                    MI, coronary artery disease. Other Factors: RLE TMA 01/31/2024.  Vascular Interventions: CERAB with aortic and bilateral iliac stenting on                         01/23/2024. Comparison Study: previous exam was on 01/30/2024 Performing Technologist: Leigh Rom RVT/RDMS  Examination Guidelines: A complete evaluation includes at minimum, Doppler waveform signals and systolic blood pressure reading at the level of bilateral brachial, anterior tibial, and posterior tibial arteries, when vessel segments are accessible. Bilateral testing is considered an integral part of a complete examination. Photoelectric Plethysmograph (PPG) waveforms and toe systolic pressure readings are included as required and additional duplex testing  as needed. Limited examinations for reoccurring indications may be performed as noted.  ABI Findings: +---------+------------------+-----+---------+--------+ Right    Rt Pressure (mmHg)IndexWaveform Comment  +---------+------------------+-----+---------+--------+ Brachial 192                    triphasic         +---------+------------------+-----+---------+--------+ PTA      205               1.07 triphasic         +---------+------------------+-----+---------+--------+ DP       191               0.99 triphasic         +---------+------------------+-----+---------+--------+ Great Toe  TMA      +---------+------------------+-----+---------+--------+ +---------+------------------+-----+---------+----------------+ Left     Lt Pressure (mmHg)IndexWaveform Comment          +---------+------------------+-----+---------+----------------+ Brachial 190                    triphasic                 +---------+------------------+-----+---------+----------------+ PTA      198               1.03 biphasic audibly biphasic +---------+------------------+-----+---------+----------------+ DP       198               1.03 triphasic                 +---------+------------------+-----+---------+----------------+ Great Toe175               0.91 Normal                    +---------+------------------+-----+---------+----------------+ +-------+-----------+-----------+------------+------------+ ABI/TBIToday's ABIToday's TBIPrevious ABIPrevious TBI +-------+-----------+-----------+------------+------------+ Right  1.07       amputation 0.96        absent       +-------+-----------+-----------+------------+------------+ Left   1.03       0.91       0.86        0.68         +-------+-----------+-----------+------------+------------+  Summary: Right: Resting right ankle-brachial index is within normal range.  Left: Resting left  ankle-brachial index is within normal range. The left toe-brachial index is normal.  *See table(s) above for measurements and observations.  Electronically signed by Lonni Gaskins MD on 03/11/2024 at 11:34:43 AM.    Final        Nydia Distance M.D. [facility] 03/12/2024, 12:00 PM  Available via Epic secure chat 7am-7pm After 7 pm, please refer to night coverage provider listed on amion.    "

## 2024-03-12 NOTE — Inpatient Diabetes Management (Signed)
 Inpatient Diabetes Program Recommendations  AACE/ADA: New Consensus Statement on Inpatient Glycemic Control (2015)  Target Ranges:  Prepandial:   less than 140 mg/dL      Peak postprandial:   less than 180 mg/dL (1-2 hours)      Critically ill patients:  140 - 180 mg/dL   Lab Results  Component Value Date   GLUCAP 273 (H) 03/12/2024   HGBA1C 10.0 (H) 03/10/2024    Review of Glycemic Control  Diabetes history: DM2 Outpatient Diabetes medications:  Lantus  12 units daily Novolog  0-10 units tid meal coverage Current orders for Inpatient glycemic control: Lantus  12 units daily Novolog  4 tid meal coverage if eats 50% (do not give if pt. Does not eat) Novolog  0-15 units q 4 hrs.  Inpatient Diabetes Program Recommendations:   Pt. NPO after midnight-scheduled for foot amputation. Noted A1c elevated to 10.0 (average glucose 240 over the past 2-3 months). Chart states patient has not been taking his meds for the past few days including his insulin .  Thank you, Gevorg Brum E. Finley Dinkel, RN, MSN, CNS, CDCES  Diabetes Coordinator Inpatient Glycemic Control Team Team Pager [phone] (8am-5pm) 03/12/2024 12:20 PM

## 2024-03-12 NOTE — Progress Notes (Addendum)
" ° ° °  PROCEDURAL EXPEDITER PROGRESS NOTE  Patient Name: Brandy Hall  DOB:[birth year] Date of Admission: 03/09/2024  Date of Assessment:03/12/2024   -------------------------------------------------------------------------------------------------------------------   Brief clinical summary: pt is a 64 yr old female with Hx of type 2 DM cplicated by diabetic peripheral polyneuropathy and COPD  Orders in place:  Yes   Communication with surgical team if no orders: n/a  Labs, test, and orders reviewed: n  Requires surgical clearance: n/a No  What type of clearance: n/a  Clearance received: n/a  Barriers noted:n/a   Intervention provided by [facility] team: n/a  Barrier resolved:  not applicablecpmplicated by peripheral oen   -------------------------------------------------------------------------------------------------------------------  [employer], Brandy Hall Please contact us  directly via secure chat (search for [facility]) or by calling us  at [phone] [facility]).  "

## 2024-03-12 NOTE — Discharge Instructions (Signed)

## 2024-03-12 NOTE — Progress Notes (Signed)
 PHARMACIST LIPID MONITORING   Brandy Hall is a 64 y.o. female admitted on 03/09/2024 with PVD.  Pharmacy has been consulted to optimize lipid-lowering therapy with the indication of secondary prevention for clinical ASCVD.  Recent Labs:  Lipid Panel (last 6 months):   No results found for: CHOL, TRIG, HDL, CHOLHDL, VLDL, LDLCALC, LDLDIRECT  Hepatic function panel (last 6 months):   Lab Results  Component Value Date   AST 16 03/10/2024   ALT 7 03/10/2024   ALKPHOS 155 (H) 03/10/2024   BILITOT 0.8 03/10/2024    SCr (since admission):   Serum creatinine: 1.57 mg/dL (H) 98/93/73 9654 Estimated creatinine clearance: 39.5 mL/min (A)  Current therapy and lipid therapy tolerance Current lipid-lowering therapy: Ezetimibe  Previous lipid-lowering therapies (if applicable): Crestor 40mg  daily,  Documented or reported allergies or intolerances to lipid-lowering therapies (if applicable): Myalgias   Plan:    1.Statin intensity (high intensity recommended for all patients regardless of the LDL):  Statin intolerance noted. No statin changes due to serious side effects (ex. Myalgias with at least 2 different statins).  2.Add ezetimibe  (if any one of the following):    -already on ezetimibe    3.Refer to lipid clinic:   Yes  4.Follow-up with:  Lipid clinic referral.  5.Follow-up labs after discharge:   -Per lipid clinic  Prentice Poisson, PharmD Clinical Pharmacist **Pharmacist phone directory can now be found on [email or web address] ([email or web address]).  Listed under [facility].

## 2024-03-12 NOTE — Progress Notes (Signed)
" °  Progress Note    03/12/2024 8:20 AM 1 Day Post-Op  Subjective:  no complaints   Vitals:   03/12/24 0622 03/12/24 0800  BP:  122/73  Pulse:    Resp: 19 16  Temp:    SpO2:     Physical Exam: Lungs:  non labored Incisions:  L groin c/d/I without hematoma Extremities:  dressing left in place RLE Neurologic: A&O  CBC    Component Value Date/Time   WBC 11.7 (H) 03/12/2024 0345   RBC 3.61 (L) 03/12/2024 0345   HGB 11.6 (L) 03/12/2024 0345   HGB 14.0 12/20/2023 1104   HCT 34.1 (L) 03/12/2024 0345   HCT 42.0 12/20/2023 1104   PLT 174 03/12/2024 0345   PLT 213 12/20/2023 1104   MCV 94.5 03/12/2024 0345   MCV 106 (H) 12/20/2023 1104   MCH 32.1 03/12/2024 0345   MCHC 34.0 03/12/2024 0345   RDW 14.6 03/12/2024 0345   RDW 13.2 12/20/2023 1104   LYMPHSABS 2.0 03/10/2024 0437   LYMPHSABS 1.0 12/20/2023 1104   MONOABS 0.8 03/10/2024 0437   EOSABS 0.1 03/10/2024 0437   EOSABS 0.0 12/20/2023 1104   BASOSABS 0.0 03/10/2024 0437   BASOSABS 0.0 12/20/2023 1104    BMET    Component Value Date/Time   NA 126 (L) 03/12/2024 0345   NA 140 12/20/2023 1104   K 3.5 03/12/2024 0345   CL 97 (L) 03/12/2024 0345   CO2 19 (L) 03/12/2024 0345   GLUCOSE 202 (H) 03/12/2024 0345   BUN 13 03/12/2024 0345   BUN 7 (L) 12/20/2023 1104   CREATININE 1.57 (H) 03/12/2024 0345   CALCIUM 8.6 (L) 03/12/2024 0345   GFRNONAA 36 (L) 03/12/2024 0345    INR    Component Value Date/Time   INR 0.9 03/11/2024 0016     Intake/Output Summary (Last 24 hours) at 03/12/2024 0820 Last data filed at 03/12/2024 0449 Gross per 24 hour  Intake 1736.86 ml  Output --  Net 1736.86 ml     Assessment/Plan:  64 y.o. female is s/p aortogram with R renal artery stenting and diagnostic RLE angiogram  1 Day Post-Op   No hemodynamically significant stenosis noted RLE angiogram.  She is optimized from our standpoint.  Plans noted for R TMA revision with Dr. Malvin tomorrow 03/13/24.  She is aware she is at risk  for leg amputation if unable to heal.  R renal artery stented.  Cr increased this morning.  Monitor BP and renal function.  Continue aspirin  and plavix .  Office will arrange duplex in 5-6 weeks.   Donnice Sender, PA-C [facility] [phone] 03/12/2024 8:20 AM    "

## 2024-03-12 NOTE — Plan of Care (Signed)
   Problem: Fluid Volume: Goal: Ability to maintain a balanced intake and output will improve Outcome: Progressing

## 2024-03-12 NOTE — Progress Notes (Signed)
 "  PODIATRY PROGRESS NOTE Patient Name: Brandy Hall  DOB 11/12/1960 DOA 03/09/2024  Hospital Day: 4  Assessment:  64 y.o. female with PMHx significant for  significant for type 2 diabetes mellitus complicated by diabetic peripheral polyneuropathy, right-sided diabetic foot wound status post transmetatarsal amputation on 01/31/2024, COPD, central pretension, hyperlipidemia.  Podiatry consulted due to nonhealing right foot TMA site with osteomyeltiis and necrotic changes at the distal amputation site related to smoking post op, PAD, and DM2   Plan:  - NPO p MN for OR Wednesday for R foot TMA revision, possible wound vac application, possible graft application. She agreed to proceed after discussion today -Appreciate vascular s/p angio yesterday - Continue IV abx broad spectrum pending further culture data - Anticoagulation: per primary team - Wound care: Betadine wet-to-dry gauze dressing - WB status: Nonweightbearing right foot - Will continue to follow        [name], DPM [facility]    Subjective:  Pt seen and discussed need for surgical intervention to save right foot .After seeing her right foot with dressing change she was surprised it looked so bad and states she is now agreeable to proceed. All questions were answered regarding the surgery.  Discussed with her daughter also.   Objective:   Vitals:   03/12/24 0928 03/12/24 1000  BP: (!) 87/55 92/66  Pulse: 77 63  Resp: 15 19  Temp: 97.6 F (36.4 C)   SpO2: 97% 98%       Latest Ref Rng & Units 03/12/2024    3:45 AM 03/11/2024    4:06 PM 03/11/2024   12:16 AM  CBC  WBC 4.0 - 10.5 K/uL 11.7  11.1  8.3   Hemoglobin 12.0 - 15.0 g/dL 88.3  84.8  87.6   Hematocrit 36.0 - 46.0 % 34.1  44.3  35.8   Platelets 150 - 400 K/uL 174  202  232        Latest Ref Rng & Units 03/12/2024    3:45 AM 03/11/2024    4:06 PM 03/11/2024   12:16 AM  BMP  Glucose 70 - 99 mg/dL 797   824   BUN 8 - 23 mg/dL 13   7    Creatinine 9.55 - 1.00 mg/dL 8.42  8.98  8.98   Sodium 135 - 145 mmol/L 126   132   Potassium 3.5 - 5.1 mmol/L 3.5   3.5   Chloride 98 - 111 mmol/L 97   101   CO2 22 - 32 mmol/L 19   22   Calcium 8.9 - 10.3 mg/dL 8.6   8.5     General: AAOx3, NAD  Lower Extremity Exam Vasc:     Nonpalpable right foot pedal pulses.  Unable to appreciate capillary refill time to the TMA stump dorsal or plantar flap.  TMA stump does appear somewhat ischemic and is cool to touch   Derm:    Fibrotic eschar noted about the incision line right TMA site.  No obvious drainage.  No areas of deep probing.  No fluctuance.  No malodor.  No evidence of active drainage.   MSK:     Status post right TMA.  Right foot tender on palpation   Neuro:   Decreased light touch sensation right foot     Right foot MRI IMPRESSION: 1. Surgical changes from recent transmetatarsal amputation. 2. Suspect open wounds along the dorsum of the foot in the region of the metatarsal amputation sites.  3. Abnormal complex fluid collections noted along the metatarsal amputation sites consistent with postoperative abscesses. 4. T1 and T2 signal changes at the osteotomy sites highly suspicious for osteomyelitis. 5. Diffuse subcutaneous soft tissue swelling/edema/fluid mainly along the dorsum of the foot consistent with cellulitis. 6. Myofasciitis without definite findings for pyomyositis.     Radiology:  Results reviewed. See assessment for pertinent imaging results  "

## 2024-03-13 ENCOUNTER — Encounter (HOSPITAL_COMMUNITY)
Admission: EM | Disposition: A | Discharge status: Home / Self Care | Payer: Self-pay | Source: Home / Self Care | Attending: Internal Medicine

## 2024-03-13 ENCOUNTER — Inpatient Hospital Stay (HOSPITAL_COMMUNITY): Payer: Self-pay | Admitting: Registered Nurse

## 2024-03-13 ENCOUNTER — Inpatient Hospital Stay (HOSPITAL_COMMUNITY)

## 2024-03-13 ENCOUNTER — Encounter (HOSPITAL_COMMUNITY): Payer: Self-pay | Admitting: Internal Medicine

## 2024-03-13 DIAGNOSIS — L089 Local infection of the skin and subcutaneous tissue, unspecified: Secondary | ICD-10-CM | POA: Diagnosis not present

## 2024-03-13 DIAGNOSIS — I96 Gangrene, not elsewhere classified: Secondary | ICD-10-CM

## 2024-03-13 DIAGNOSIS — M86371 Chronic multifocal osteomyelitis, right ankle and foot: Secondary | ICD-10-CM

## 2024-03-13 DIAGNOSIS — E11628 Type 2 diabetes mellitus with other skin complications: Secondary | ICD-10-CM | POA: Diagnosis not present

## 2024-03-13 DIAGNOSIS — I10 Essential (primary) hypertension: Secondary | ICD-10-CM

## 2024-03-13 DIAGNOSIS — F17210 Nicotine dependence, cigarettes, uncomplicated: Secondary | ICD-10-CM

## 2024-03-13 DIAGNOSIS — L03031 Cellulitis of right toe: Secondary | ICD-10-CM | POA: Diagnosis not present

## 2024-03-13 DIAGNOSIS — E1152 Type 2 diabetes mellitus with diabetic peripheral angiopathy with gangrene: Secondary | ICD-10-CM

## 2024-03-13 DIAGNOSIS — I2510 Atherosclerotic heart disease of native coronary artery without angina pectoris: Secondary | ICD-10-CM

## 2024-03-13 HISTORY — PX: TRANSMETATARSAL AMPUTATION: SHX6197

## 2024-03-13 LAB — LIPID PANEL
Cholesterol: 141 mg/dL (ref 0–200)
HDL: 31 mg/dL — ABNORMAL LOW
LDL Cholesterol: 77 mg/dL (ref 0–99)
Total CHOL/HDL Ratio: 4.5 ratio
Triglycerides: 165 mg/dL — ABNORMAL HIGH
VLDL: 33 mg/dL (ref 0–40)

## 2024-03-13 LAB — BASIC METABOLIC PANEL WITH GFR
Anion gap: 13 (ref 5–15)
BUN: 10 mg/dL (ref 8–23)
CO2: 20 mmol/L — ABNORMAL LOW (ref 22–32)
Calcium: 9.1 mg/dL (ref 8.9–10.3)
Chloride: 98 mmol/L (ref 98–111)
Creatinine, Ser: 1.21 mg/dL — ABNORMAL HIGH (ref 0.44–1.00)
GFR, Estimated: 49 mL/min — ABNORMAL LOW
Glucose, Bld: 107 mg/dL — ABNORMAL HIGH (ref 70–99)
Potassium: 3.2 mmol/L — ABNORMAL LOW (ref 3.5–5.1)
Sodium: 130 mmol/L — ABNORMAL LOW (ref 135–145)

## 2024-03-13 LAB — GLUCOSE, CAPILLARY
Glucose-Capillary: 250 mg/dL — ABNORMAL HIGH (ref 70–99)
Glucose-Capillary: 117 mg/dL — ABNORMAL HIGH (ref 70–99)
Glucose-Capillary: 107 mg/dL — ABNORMAL HIGH (ref 70–99)
Glucose-Capillary: 140 mg/dL — ABNORMAL HIGH (ref 70–99)
Glucose-Capillary: 146 mg/dL — ABNORMAL HIGH (ref 70–99)
Glucose-Capillary: 149 mg/dL — ABNORMAL HIGH (ref 70–99)
Glucose-Capillary: 166 mg/dL — ABNORMAL HIGH (ref 70–99)
Glucose-Capillary: 183 mg/dL — ABNORMAL HIGH (ref 70–99)
Glucose-Capillary: 176 mg/dL — ABNORMAL HIGH (ref 70–99)

## 2024-03-13 MED ORDER — FENTANYL CITRATE (PF) 100 MCG/2ML IJ SOLN
INTRAMUSCULAR | Status: AC
  Filled 2024-03-13: qty 2

## 2024-03-13 MED ORDER — ORAL CARE MOUTH RINSE: 15.0000 mL | Freq: Once | OROMUCOSAL | Status: AC

## 2024-03-13 MED ORDER — FENTANYL CITRATE (PF) 100 MCG/2ML IJ SOLN
25.0000 ug | INTRAMUSCULAR | Status: DC | PRN
  Administered 2024-03-13 (×2): 50 ug via INTRAVENOUS

## 2024-03-13 MED ORDER — ONDANSETRON HCL 4 MG/2ML IJ SOLN: 4.0000 mg | Freq: Four times a day (QID) | INTRAMUSCULAR | Status: DC | PRN

## 2024-03-13 MED ORDER — FENTANYL CITRATE (PF) 250 MCG/5ML IJ SOLN
INTRAMUSCULAR | Status: DC | PRN
  Administered 2024-03-13 (×2): 50 ug via INTRAVENOUS

## 2024-03-13 MED ORDER — LACTATED RINGERS IV SOLN: INTRAVENOUS | Status: DC

## 2024-03-13 MED ORDER — PROPOFOL 10 MG/ML IV BOLUS
INTRAVENOUS | Status: AC
  Filled 2024-03-13: qty 20

## 2024-03-13 MED ORDER — CHLORHEXIDINE GLUCONATE 0.12 % MT SOLN: 15.0000 mL | Freq: Once | OROMUCOSAL | Status: AC

## 2024-03-13 MED ORDER — FENTANYL CITRATE (PF) 100 MCG/2ML IJ SOLN
INTRAMUSCULAR | Status: AC
  Administered 2024-03-13: 50 ug via INTRAVENOUS
  Filled 2024-03-13: qty 2

## 2024-03-13 MED ORDER — PROPOFOL 10 MG/ML IV BOLUS
INTRAVENOUS | Status: DC | PRN
  Administered 2024-03-13: 50 mg via INTRAVENOUS
  Administered 2024-03-13: 75 ug/kg/min via INTRAVENOUS
  Administered 2024-03-13: 30 mg via INTRAVENOUS

## 2024-03-13 MED ORDER — FENTANYL CITRATE (PF) 100 MCG/2ML IJ SOLN: 50.0000 ug | Freq: Once | INTRAMUSCULAR | Status: AC

## 2024-03-13 MED ORDER — SODIUM CHLORIDE 0.9 % IR SOLN
Status: DC | PRN
  Administered 2024-03-13: 1000 mL

## 2024-03-13 MED ORDER — CHLORHEXIDINE GLUCONATE 0.12 % MT SOLN
OROMUCOSAL | Status: AC
  Administered 2024-03-13: 15 mL via OROMUCOSAL
  Filled 2024-03-13: qty 15

## 2024-03-13 MED ORDER — INSULIN ASPART 100 UNIT/ML IJ SOLN: 0.0000 [IU] | INTRAMUSCULAR | Status: DC | PRN

## 2024-03-13 NOTE — Progress Notes (Signed)
 " Progress Note   Patient: Brandy Hall FMW:[id number] DOB: 30-Oct-1960 DOA: 03/09/2024     3 DOS: the patient was seen and examined on 03/13/2024   Brief hospital course: 64 year old female with diabetes mellitus type 2,  diabetic peripheral neuropathy, right-sided diabetic foot wound status post TMA on 01/31/2024, COPD, HTN, HLP, severely noncompliant presented to ED complaining of polyuria, feeling sick, somewhat confused, dizziness, dysuria for the last 4 days..  Patient reported that she has not been taking any of her medications for last several days including insulin .  Patient was hospitalized 01/29/2024-02/06/2024 for sepsis and gangrene of toe of right foot, underwent TMA on 11/26 with podiatry, Dr. Malvin.  She was discharged to SNF, then subsequently discharged from SNF back to home.   In the last 2 to 3 days, also noted increasing swelling associated with right TMA wound, increasing erythema and drainage.  Per chart review, she had recently completed a course of Augmentin  and finishing a course of Macrobid  for UTI at this time   In ED, initial CBG 554, BHB 0.08, VBG pH 7.37.  Sodium 121, potassium 3.1, creatinine 1.0. UA negative.  UDS negative.  Initial lactate 2.7-> 2.9   Patient was started on IV antibiotics and admitted for further workup.  Assessment and Plan: Right foot diabetic foot infection (HCC) with cellulitis, in the setting of recent TMA -Concern for potential postoperative wound infection in the setting of noncompliance, hyperglycemia poor wound care - MRI foot with postop abscesses noted along the metatarsal amputation sites, T1 and T2 signal changes at osteotomy sites highly suspicious for osteomyelitis, cellulitis -Vascular surgery consulted, underwent angiogram for right renal stenosis and already on 1/5 -Podiatry following, now s/p right foot TMA revision - Continued IV linezolid , cefepime , plan to narrow coverage -Per Podiatry, rec for non-weightbearing. PT  consulted     Diabetes mellitus type 2, IDDM, uncontrolled with hyperglycemia, severely noncompliant - Presented with CBG of 554, hemoglobin A1c 10.0    - cont Lantus  12 units daily, NovoLog  meal coverage 4 units 3 times daily AC, moderate SSI  - Diabetic coordinator consulted     Lactic acidosis - Likely due to dehydration, no sepsis.  Improved with IV fluids   Hyponatremia - Likely pseudohyponatremia due to hyperglycemia on admission, NA 121.   - Sodium now improved   Acute kidney injury -  creatinine improved with hydration   Severe hypokalemia - Improved, replace as needed     Tobacco abuse -Counseled on smoking cessation, continue nicotine  patch       COPD (chronic obstructive pulmonary disease) (HCC) -Currently no acute wheezing, continue albuterol  nebs as needed   Hypertension, CAD - BP initially soft,  Lopressor , amlodipine  held -continue plavix    Subjective: Eager to go home soon  Physical Exam: Vitals:   03/13/24 1030 03/13/24 1045 03/13/24 1100 03/13/24 1127  BP: (!) 125/104 (!) 108/59 114/71 120/74  Pulse: 83 84 85 89  Resp: 11 12 16 14   Temp:   98.5 F (36.9 C) 98.2 F (36.8 C)  TempSrc:    Oral  SpO2: 96% 99% 98% 94%  Weight:      Height:       General exam: Awake, laying in bed, in nad Respiratory system: Normal respiratory effort, no wheezing Cardiovascular system: regular rate, s1, s2 Gastrointestinal system: Soft, nondistended, positive BS Central nervous system: CN2-12 grossly intact, strength intact Extremities: s/p partial R foot, no clubbing Skin: Normal skin turgor, no notable skin lesions seen Psychiatry: Mood normal //  no visual hallucinations   Data Reviewed:  Labs reviewed: Na 130, K 3.2, Cr 1.21  Family Communication: Pt in room, family not at bedside  Disposition: Status is: Inpatient Remains inpatient appropriate because: severity of illness  Planned Discharge Destination: Home    Author: Garnette Pelt, MD 03/13/2024  4:28 PM  For on call review [email or web address].  "

## 2024-03-13 NOTE — Progress Notes (Signed)
" °  Progress Note    03/13/2024 7:26 AM 2 Days Post-Op  Subjective: No overnight issues  Vitals:   03/13/24 0309 03/13/24 0724  BP: 109/65 106/64  Pulse: 84 80  Resp: 20 (!) 21  Temp: 98.9 F (37.2 C) 98.1 F (36.7 C)  SpO2: 98% 100%    Physical Exam: Awake alert and oriented Left groin is soft and nontender without hematoma Palpable right posterior tibial pulse  CBC    Component Value Date/Time   WBC 11.7 (H) 03/12/2024 0345   RBC 3.61 (L) 03/12/2024 0345   HGB 11.6 (L) 03/12/2024 0345   HGB 14.0 12/20/2023 1104   HCT 34.1 (L) 03/12/2024 0345   HCT 42.0 12/20/2023 1104   PLT 174 03/12/2024 0345   PLT 213 12/20/2023 1104   MCV 94.5 03/12/2024 0345   MCV 106 (H) 12/20/2023 1104   MCH 32.1 03/12/2024 0345   MCHC 34.0 03/12/2024 0345   RDW 14.6 03/12/2024 0345   RDW 13.2 12/20/2023 1104   LYMPHSABS 2.0 03/10/2024 0437   LYMPHSABS 1.0 12/20/2023 1104   MONOABS 0.8 03/10/2024 0437   EOSABS 0.1 03/10/2024 0437   EOSABS 0.0 12/20/2023 1104   BASOSABS 0.0 03/10/2024 0437   BASOSABS 0.0 12/20/2023 1104    BMET    Component Value Date/Time   NA 126 (L) 03/12/2024 0345   NA 140 12/20/2023 1104   K 3.5 03/12/2024 0345   CL 97 (L) 03/12/2024 0345   CO2 19 (L) 03/12/2024 0345   GLUCOSE 202 (H) 03/12/2024 0345   BUN 13 03/12/2024 0345   BUN 7 (L) 12/20/2023 1104   CREATININE 1.57 (H) 03/12/2024 0345   CALCIUM 8.6 (L) 03/12/2024 0345   GFRNONAA 36 (L) 03/12/2024 0345    INR    Component Value Date/Time   INR 0.9 03/11/2024 0016     Intake/Output Summary (Last 24 hours) at 03/13/2024 0726 Last data filed at 03/13/2024 0239 Gross per 24 hour  Intake 1871.74 ml  Output 200 ml  Net 1671.74 ml     Assessment/plan:  64 y.o. female is s/p stenting right renal artery for occlusion after endovascular aortic iliac reconstruction.  She also underwent right lower extremity angiography which demonstrates inline flow to the right foot.  Plan is for debridement of right  TMA.  We again discussed the risk of future below-knee amputation and need for adherence to medications and podiatry postop instructions.    Taylinn Brabant C. Sheree, MD [facility] [place] Office: [phone] Pager: [phone]  03/13/2024 7:26 AM  "

## 2024-03-13 NOTE — Anesthesia Preprocedure Evaluation (Signed)
"                                    Anesthesia Evaluation  Patient identified by MRN, date of birth, ID band Patient awake    Reviewed: Allergy & Precautions, H&P , NPO status , Patient's Chart, lab work & pertinent test results  Airway Mallampati: II   Neck ROM: full    Dental   Pulmonary sleep apnea , COPD, Current Smoker and Patient abstained from smoking.   breath sounds clear to auscultation       Cardiovascular hypertension, + CAD, + Past MI and + Peripheral Vascular Disease   Rhythm:regular Rate:Normal     Neuro/Psych  Headaches    GI/Hepatic ,GERD  ,,  Endo/Other  diabetes, Type 2    Renal/GU Renal InsufficiencyRenal disease     Musculoskeletal  (+) Arthritis ,    Abdominal   Peds  Hematology   Anesthesia Other Findings   Reproductive/Obstetrics                              Anesthesia Physical Anesthesia Plan  ASA: 3  Anesthesia Plan: MAC and Regional   Post-op Pain Management:    Induction: Intravenous  PONV Risk Score and Plan: 1 and Propofol  infusion and Treatment may vary due to age or medical condition  Airway Management Planned: Simple Face Mask  Additional Equipment:   Intra-op Plan:   Post-operative Plan:   Informed Consent: I have reviewed the patients History and Physical, chart, labs and discussed the procedure including the risks, benefits and alternatives for the proposed anesthesia with the patient or authorized representative who has indicated his/her understanding and acceptance.     Dental advisory given  Plan Discussed with: CRNA, Anesthesiologist and Surgeon  Anesthesia Plan Comments:         Anesthesia Quick Evaluation  "

## 2024-03-13 NOTE — Transfer of Care (Signed)
 Immediate Anesthesia Transfer of Care Note  Patient: NAUREEN BENTON  Procedure(s) Performed: REVISION TRANSMETATARSAL  RIGHT FOOT AMPUTATION  DBRIDEMENT WITH GRAFT (Right: Toe)  Patient Location: PACU  Anesthesia Type:MAC and Regional  Level of Consciousness: awake, alert , and patient cooperative  Airway & Oxygen Therapy: Patient Spontanous Breathing  Post-op Assessment: Report given to RN and Post -op Vital signs reviewed and stable  Post vital signs: Reviewed and stable  Last Vitals:  Vitals Value Taken Time  BP 118/91 1015  Temp    Pulse 70 1015  Resp 14 03/13/24 10:15  SpO2 95% 1015  Vitals shown include unfiled device data.  Last Pain:  Vitals:   03/13/24 0930  TempSrc:   PainSc: 0-No pain      Patients Stated Pain Goal: 0 (03/10/24 1951)  Complications: There were no known notable events for this encounter.

## 2024-03-13 NOTE — Op Note (Addendum)
 Full Operative Report  Date of Operation: 10:15 AM, 03/13/2024   Patient: Brandy Hall - 64 y.o. female  Surgeon: Malvin Marsa FALCON, DPM   Assistant: None  Diagnosis: osteomyelitis and gangrene right foot TMA site  Procedure:  1.  Revision transmetatarsal amputation with further resection of metatarsals 1 through 5, right foot 2.  Application amniotic allograft 5 x 5 cm, right foot    Anesthesia: Monitor Anesthesia Care  Maryclare Cornet, MD  Anesthesiologist: Maryclare Cornet, MD CRNA: Christopher Comings, CRNA   Estimated Blood Loss:  50 mL  Hemostasis: 1) Anatomical dissection, mechanical compression, electrocautery 2) no tourniquet was used in procedure  Implants: * No implants in log * Bio Vance single-layer 5 x 5 cm amniotic graft  Materials: Prolene 2-0, skin staples  Injectables: 1) Pre-operatively: Block done per anesthesia 2) Post-operatively: None   Specimens: - Pathology: Metatarsals 1 through 5 resection margin with proximal margin being cut surface - Microbiology: Tissue culture from soft tissue overlying the amputation stump with some necrosis   Antibiotics: IV antibiotics given per schedule on the floor  Drains: None  Complications: Patient tolerated the procedure well without complication.   Operative findings: As below in detailed report  Indications for Procedure: Brandy Hall presents to Malvin Marsa FALCON, DPM with a chief complaint of nonhealing right foot TMA site with concern for underlying osteomyelitis at the amputation site.  The patient has failed conservative treatments of various modalities. At this time the patient has elected to proceed with surgical correction. All alternatives, risks, and complications of the procedures were thoroughly explained to the patient. Patient exhibits appropriate understanding of all discussion points and informed consent was signed and obtained in the chart with no guarantees to surgical outcome  given or implied.  Description of Procedure: Patient was brought to the operating room. Patient remained on their hospital bed in the supine position. A surgical timeout was performed and all members of the operating room, the procedure, and the surgical site were identified. anesthesia occurred as per anesthesia record. Local anesthetic as previously described was then injected about the operative field in a local infiltrative block.  The operative lower extremity as noted above was then prepped and draped in the usual sterile manner. The following procedure then began.  Attention was directed to the right foot transmetatarsal amputation stump.  There was noted to be necrotic tissue along the entirety of the incision line.  There was some questionable fibrotic and infected tissue present along the amputation stump.  Marking pen was used to mark out a elliptical area of soft tissue at the distal stump of the amputation site following the prior contour.  Full-thickness incision was made with 10 blade to excise the ellipse of tissue.  This tissue was sent for culture.  Then using 10 blade and 15 blade the 1st through 5th metatarsal stumps were exposed and were noted to be visually free of infection no obvious evidence of purulence or necrotic bone.  Then using a Kieth sagittal saw the 1st through 5th metatarsal was resected approximately 1.5 to 2 cm more proximally then at the current amputation level.  The resected ends of the 1st or 5th metatarsal were sent for pathology with the proximal margin being the freshly cut surface.  After this the amputation site was irrigated thoroughly with 1 L of sterile saline.  Hemostasis was achieved with electrocautery.  There was noted to be good bleeding from the distal dorsal and plantar flap.  No evidence  of residual infection.  No evidence of obvious avascularity.    Decision was then made to place an amniotic graft in the amputation site to aid in healing and prevent  residual necrosis.  A 5 x 5 piece of amniotic graft bio Gaile was placed flat on the plantar flap.  No graft was wasted.  The amputation site was then closed with the plantar flap being brought towards the dorsal flap and sutured with 2-0 Prolene and then stapled with skin staples.  The surgical site was then dressed with Xeroform 4 x 4 gauze ABD pad Kerlix Ace wrap. The patient tolerated both the procedure and anesthesia well with vital signs stable throughout. The patient was transferred in good condition and all vital signs stable  from the OR to recovery under the discretion of anesthesia.  Condition: Vital signs stable, neurovascular status unchanged from preoperative   Surgical plan:  Good bleeding out of the surgical site and no evidence of gross infection.  Unclear if there is actually osteomyelitis at the amputation stump site however feel it is prudent to resect further on the metatarsals to ensure clean margin.  I do suspect clean margin at this point.  Good healing potential.  Nonweightbearing to the right foot in postop shoe.  Recommend 3 to 5 days p.o. antibiotics from surgery.  Okay to de-escalate  The patient will be nonweightbearing in a postop shoe to the operative limb until further instructed. The dressing is to remain clean, dry, and intact. Will continue to follow unless noted elsewhere.   Marsa Honour, DPM [facility]

## 2024-03-13 NOTE — Plan of Care (Signed)
   Problem: Metabolic: Goal: Ability to maintain appropriate glucose levels will improve Outcome: Progressing

## 2024-03-13 NOTE — Progress Notes (Signed)
 History and Physical Interval Note:  03/13/2024 9:04 AM  Brandy Hall  has presented today for surgery, with the diagnosis of necrotic tissue and osteomyelitis at TMA stump right foot.  The various methods of treatment have been discussed with the patient and family. After consideration of risks, benefits and other options for treatment, the patient has consented to   Procedures with comments: AMPUTATION, FOOT, TRANSMETATARSAL (Right) - Right foot TMA revision as a surgical intervention.  The patient's history has been reviewed, patient examined, no change in status, stable for surgery.  I have reviewed the patient's chart and labs.  Questions were answered to the patient's satisfaction.     Brandy Hall

## 2024-03-13 NOTE — Hospital Course (Signed)
 64 year old female with diabetes mellitus type 2,  diabetic peripheral neuropathy, right-sided diabetic foot wound status post TMA on 01/31/2024, COPD, HTN, HLP, severely noncompliant presented to ED complaining of polyuria, feeling sick, somewhat confused, dizziness, dysuria for the last 4 days..  Patient reported that she has not been taking any of her medications for last several days including insulin .  Patient was hospitalized 01/29/2024-02/06/2024 for sepsis and gangrene of toe of right foot, underwent TMA on 11/26 with podiatry, Dr. Malvin.  She was discharged to SNF, then subsequently discharged from SNF back to home.   In the last 2 to 3 days, also noted increasing swelling associated with right TMA wound, increasing erythema and drainage.  Per chart review, she had recently completed a course of Augmentin  and finishing a course of Macrobid  for UTI at this time   In ED, initial CBG 554, BHB 0.08, VBG pH 7.37.  Sodium 121, potassium 3.1, creatinine 1.0. UA negative.  UDS negative.  Initial lactate 2.7-> 2.9   Patient was started on IV antibiotics and admitted for further workup.

## 2024-03-14 ENCOUNTER — Inpatient Hospital Stay (HOSPITAL_COMMUNITY)

## 2024-03-14 ENCOUNTER — Encounter (HOSPITAL_COMMUNITY): Payer: Self-pay | Admitting: Podiatry

## 2024-03-14 ENCOUNTER — Other Ambulatory Visit (HOSPITAL_COMMUNITY): Payer: Self-pay

## 2024-03-14 DIAGNOSIS — M7989 Other specified soft tissue disorders: Secondary | ICD-10-CM | POA: Diagnosis not present

## 2024-03-14 DIAGNOSIS — L089 Local infection of the skin and subcutaneous tissue, unspecified: Secondary | ICD-10-CM | POA: Diagnosis not present

## 2024-03-14 DIAGNOSIS — E11628 Type 2 diabetes mellitus with other skin complications: Secondary | ICD-10-CM | POA: Diagnosis not present

## 2024-03-14 DIAGNOSIS — L03031 Cellulitis of right toe: Secondary | ICD-10-CM | POA: Diagnosis not present

## 2024-03-14 LAB — GLUCOSE, CAPILLARY
Glucose-Capillary: 106 mg/dL — ABNORMAL HIGH (ref 70–99)
Glucose-Capillary: 109 mg/dL — ABNORMAL HIGH (ref 70–99)
Glucose-Capillary: 171 mg/dL — ABNORMAL HIGH (ref 70–99)
Glucose-Capillary: 171 mg/dL — ABNORMAL HIGH (ref 70–99)
Glucose-Capillary: 239 mg/dL — ABNORMAL HIGH (ref 70–99)
Glucose-Capillary: 75 mg/dL (ref 70–99)

## 2024-03-14 LAB — COMPREHENSIVE METABOLIC PANEL WITH GFR
ALT: 7 U/L (ref 0–44)
AST: 22 U/L (ref 15–41)
Albumin: 3.3 g/dL — ABNORMAL LOW (ref 3.5–5.0)
Alkaline Phosphatase: 116 U/L (ref 38–126)
Anion gap: 9 (ref 5–15)
BUN: 10 mg/dL (ref 8–23)
CO2: 21 mmol/L — ABNORMAL LOW (ref 22–32)
Calcium: 9 mg/dL (ref 8.9–10.3)
Chloride: 103 mmol/L (ref 98–111)
Creatinine, Ser: 1.21 mg/dL — ABNORMAL HIGH (ref 0.44–1.00)
GFR, Estimated: 49 mL/min — ABNORMAL LOW
Glucose, Bld: 77 mg/dL (ref 70–99)
Potassium: 3.4 mmol/L — ABNORMAL LOW (ref 3.5–5.1)
Sodium: 134 mmol/L — ABNORMAL LOW (ref 135–145)
Total Bilirubin: 0.3 mg/dL (ref 0.0–1.2)
Total Protein: 6.3 g/dL — ABNORMAL LOW (ref 6.5–8.1)

## 2024-03-14 LAB — CBC
HCT: 34.6 % — ABNORMAL LOW (ref 36.0–46.0)
Hemoglobin: 11.5 g/dL — ABNORMAL LOW (ref 12.0–15.0)
MCH: 32.1 pg (ref 26.0–34.0)
MCHC: 33.2 g/dL (ref 30.0–36.0)
MCV: 96.6 fL (ref 80.0–100.0)
Platelets: 187 K/uL (ref 150–400)
RBC: 3.58 MIL/uL — ABNORMAL LOW (ref 3.87–5.11)
RDW: 15 % (ref 11.5–15.5)
WBC: 6.7 K/uL (ref 4.0–10.5)
nRBC: 0 % (ref 0.0–0.2)

## 2024-03-14 MED ORDER — AMOXICILLIN-POT CLAVULANATE 875-125 MG PO TABS
1.0000 | ORAL_TABLET | Freq: Two times a day (BID) | ORAL | 0 refills | Status: DC
  Filled 2024-03-14: qty 10, 5d supply, fill #0

## 2024-03-14 MED ORDER — ENOXAPARIN SODIUM 80 MG/0.8ML IJ SOSY
80.0000 mg | PREFILLED_SYRINGE | Freq: Two times a day (BID) | INTRAMUSCULAR | Status: DC
  Administered 2024-03-14 – 2024-03-15 (×2): 80 mg via SUBCUTANEOUS
  Filled 2024-03-14 (×2): qty 0.8

## 2024-03-14 MED ORDER — AMOXICILLIN-POT CLAVULANATE 875-125 MG PO TABS
1.0000 | ORAL_TABLET | Freq: Two times a day (BID) | ORAL | Status: DC
  Administered 2024-03-14: 1 via ORAL
  Filled 2024-03-14 (×2): qty 1

## 2024-03-14 MED ORDER — LINEZOLID 600 MG PO TABS
600.0000 mg | ORAL_TABLET | Freq: Two times a day (BID) | ORAL | Status: DC
  Administered 2024-03-14: 600 mg via ORAL
  Filled 2024-03-14: qty 1

## 2024-03-14 MED ORDER — POTASSIUM CHLORIDE CRYS ER 20 MEQ PO TBCR
60.0000 meq | EXTENDED_RELEASE_TABLET | Freq: Once | ORAL | Status: AC
  Administered 2024-03-14: 60 meq via ORAL
  Filled 2024-03-14: qty 3

## 2024-03-14 MED ORDER — HYDROCODONE-ACETAMINOPHEN 5-325 MG PO TABS
1.0000 | ORAL_TABLET | Freq: Four times a day (QID) | ORAL | 0 refills | Status: AC | PRN
  Filled 2024-03-14: qty 5, 2d supply, fill #0

## 2024-03-14 NOTE — Progress Notes (Signed)
 Left upper extremity venous duplex has been completed.  Results can be found in chart review under CV Proc.  03/14/2024 7:14 PM  Brad Lieurance Elden Appl, RVT.

## 2024-03-14 NOTE — Progress Notes (Signed)
 PHARMACY - ANTICOAGULATION CONSULT NOTE  Pharmacy Consult for Lovenox  Indication: DVT  Allergies[1]  Patient Measurements: Height: 5' 8 (172.7 cm) Weight: 78.7 kg (173 lb 8 oz) IBW/kg (Calculated) : 63.9 HEPARIN  DW (KG): 79.8  Vital Signs: Temp: 98.8 F (37.1 C) (01/08 1630) Temp Source: Oral (01/08 1630) BP: 117/67 (01/08 1630) Pulse Rate: 89 (01/08 1630)  Labs: Recent Labs    03/12/24 0345 03/13/24 0812 03/14/24 0417  HGB 11.6*  --  11.5*  HCT 34.1*  --  34.6*  PLT 174  --  187  CREATININE 1.57* 1.21* 1.21*    Estimated Creatinine Clearance: 51.1 mL/min (A) (by C-G formula based on SCr of 1.21 mg/dL (H)).   Medical History: Past Medical History:  Diagnosis Date   Allergic rhinitis    COPD (chronic obstructive pulmonary disease) (HCC)    Diabetes (HCC)    GERD (gastroesophageal reflux disease)    History of colon polyps    Hypercholesteremia    Hypertension    IBS (irritable bowel syndrome)    MI (myocardial infarction) (HCC) 2016   Seasonal allergies    Trigeminal neuralgia     Goal of Therapy:  Therapeutic anticoagulation Monitor platelets by anticoagulation protocol: Yes   Plan:  Lovenox  80 mg Q12H  Alexa Blish M Corra Kaine 03/14/2024,5:55 PM      [1]  Allergies Allergen Reactions   Crestor [Rosuvastatin] Other (See Comments)    Myalgias    Percocet [Oxycodone -Acetaminophen ] Hives   Statins Other (See Comments)    Per patient shuts down organs   Trulicity [Dulaglutide] Nausea And Vomiting, Nausea Only and Other (See Comments)    Pancreatitis

## 2024-03-14 NOTE — Care Management Important Message (Signed)
 Important Message  Patient Details  Name: Brandy Hall MRN: [id number] Date of Birth: September 18, 1960   Important Message Given:  Yes - Medicare IM     [name] Sharps 03/14/2024, 10:31 AM

## 2024-03-14 NOTE — Progress Notes (Signed)
 Orthopedic Tech Progress Note Patient Details:  Brandy Hall [birth year] [id number]  Ortho Devices Type of Ortho Device: Postop shoe/boot Ortho Device/Splint Location: RLE Ortho Device/Splint Interventions: Ordered, Application   Post Interventions Patient Tolerated: Well  Allyse Fregeau A Latanja Lehenbauer 03/14/2024, 8:55 AM

## 2024-03-14 NOTE — Inpatient Diabetes Management (Addendum)
 Inpatient Diabetes Program Recommendations  AACE/ADA: New Consensus Statement on Inpatient Glycemic Control (2015)  Target Ranges:  Prepandial:   less than 140 mg/dL      Peak postprandial:   less than 180 mg/dL (1-2 hours)      Critically ill patients:  140 - 180 mg/dL   Lab Results  Component Value Date   GLUCAP 239 (H) 03/14/2024   HGBA1C 10.0 (H) 03/10/2024    Review of Glycemic Control  Diabetes history: type 2 Outpatient Diabetes medications: Lantus  20 units daily, Novolog  0-10 units sliding scale TID with meals if blood sugar >400 mg/dl. (Per patient) Current orders for Inpatient glycemic control: Semglee  12 units daily, Novolog  0-15 units correction scale every 4 hours, Novolog  4 units TID  Inpatient Diabetes Program Recommendations:   Spoke with patient on the phone. States that she had seen her PCP the week before she came into the hospital. Patient states that she takes Lantus  20 units daily and uses a scale of Novolog  0-10 units if blood sugars >400 mg/dl prior to meals. States that she checks her blood sugars 3 times per day. Patient states that she takes her Lantus  everyday.  Patient lives with her granddaughter and gets help from her with  meals, etc. Patient will be following up with her PCP after discharge.   Marjorie Lunger RN BSN CDE Diabetes Coordinator Pager: [phone]  8am-5pm

## 2024-03-14 NOTE — Progress Notes (Signed)
"  °  Subjective:  Patient ID: Brandy Hall, female    DOB: 22-Sep-1960,  MRN: [id number]  Chief Complaint  Patient presents with   Hyperglycemia    DOS: 03/13/2024 Procedure: 1.  Revision transmetatarsal amputation with further resection of metatarsals 1 through 5, right foot 2.  Application amniotic allograft 5 x 5 cm, right foot  64 y.o. female seen for post op check.  Patient reports she is doing well this morning she denies pain in the right foot.  Hopeful for discharge home today.  Discussed findings from surgery and follow-up plans and recommendation to stay off the right foot.  Also discussed the importance of not smoking in the postoperative period to allow wound healing on the right side which is jeopardized significantly with smoking  Review of Systems: Negative except as noted in the HPI. Denies N/V/F/Ch.   Objective:   Constitutional Well developed. Well nourished.  Vascular Foot warm and well perfused. Capillary refill normal to all digits.   No calf pain with palpation  Neurologic Normal speech. Oriented to person, place, and time. Epicritic sensation diminished to right foot  Dermatologic Dressing is clean dry and intact right foot  Orthopedic: Status post right foot TMA revision slightly more proximal   Radiographs: 1. Status post forefoot amputation revision with expected postoperative changes.   Pathology: Pending  Micro: ABUNDANT GRAM POSITIVE COCCI   Assessment:   1. Hyperglycemia   2. Nonintractable headache, unspecified chronicity pattern, unspecified headache type   3. Diabetic foot infection (HCC)   4. HLD (hyperlipidemia)   Gangrenous changes and osteomyelitis right foot TMA stump status post revision TMA more proximal with debridement of the necrotic skin and closure  Plan:  Patient was evaluated and treated and all questions answered.  POD # 1 s/p right foot TMA revision -Progressing well postoperatively.  Pain seems controlled this  morning. -Plan for PT eval early this a.m. -XR: Expected postoperative changes -WB Status: Nonweightbearing in postop shoe to right foot -Sutures: Remain intact. -Medications/ABX: Recommend 5 days Augmentin  on discharge -Dressing: Leave dressing clean dry and intact until office follow-up next week - F/u Plan: Patient will be stable for discharge home from my standpoint if okay with primary and clears PT this a.m.SABRA  She will follow-up next week Tuesday office will call to arrange.  Will sign off        [name], DPM [facility]  "

## 2024-03-14 NOTE — Progress Notes (Signed)
 PHARMACIST - PHYSICIAN COMMUNICATION DR:   Cindy CONCERNING: Antibiotic IV to Oral Route Change Policy  RECOMMENDATION: This patient is receiving Linezolid  by the intravenous route.  Based on criteria approved by the Pharmacy and Therapeutics Committee, the antibiotic(s) is/are being converted to the equivalent oral dose form(s).   DESCRIPTION: These criteria include: Patient being treated for a respiratory tract infection, urinary tract infection, cellulitis or clostridium difficile associated diarrhea if on metronidazole The patient is not neutropenic and does not exhibit a GI malabsorption state The patient is eating (either orally or via tube) and/or has been taking other orally administered medications for a least 24 hours The patient is improving clinically and has a Tmax < 100.5  If you have questions about this conversion, please contact the Pharmacy Department   [x]   [phone] )  Jolynn Pack  Venetia Gully, PharmD, [place], [facility] Clinical Pharmacist Phone [phone]

## 2024-03-14 NOTE — Progress Notes (Signed)
 Pt lost PIV overnight; IV team unable to replace PIV. MD made aware that pt is without PIV at this time.

## 2024-03-14 NOTE — Evaluation (Signed)
 Occupational Therapy Evaluation Patient Details Name: Brandy Hall MRN: [id number] DOB: 06/06/1960 Today's Date: 03/14/2024   History of Present Illness   64 y.o. female presents to [facility] hospital on 03/09/2024 with hypergylcemia. S/P R renal artery stent on 03/13/2024. Pt underwent R foot TMA revision on 1/05. PMH includes CAD, PAD, COPD, HTN, HLD, Trigeminal neuralgia, GERD and s/p R TMA 01/31/2024.     Clinical Impressions Pt admitted based on above, and was seen based on problem list below. PTA pt reports independence with ADLs and IADLs. Today pt is requiring set up  to mod +2  assist for ADLs.Pt limited by decreased strength, balance, and cognition. Pt with poor insight into precautions, despite frequent education and multimodal cueing. Pt mod +2 for sit to stand, however is unable to take hops or pivotal step for transfer. Pt CGA for lateral scoot transfer to drop arm chair, however reports BSC at home is not drop -arm. Educated pt on importance of maintaining NWB status to ensure proper healing and safety with d/c. Pt verbalized understanding, but reporting I do what I need to do, despite inability to problem solve transfers with the therapist. Spoke with daughter via phone call, with pt's permission, regarding recommending post-acute rehab. Daughter and pt reporting preference for d/c home, educated both on need for 24/7 assistance and supervision to ensure proper wound healing and prevent further wound dehiscence. OT will continue to follow acutely to maximize functional independence.       If plan is discharge home, recommend the following:   Two people to help with walking and/or transfers;Two people to help with bathing/dressing/bathroom;Assist for transportation;Help with stairs or ramp for entrance     Functional Status Assessment   Patient has had a recent decline in their functional status and demonstrates the ability to make significant improvements in function in a  reasonable and predictable amount of time.     Equipment Recommendations   Other (comment) (Defer to next venue)      Precautions/Restrictions   Precautions Precautions: Fall Recall of Precautions/Restrictions: Intact Precaution/Restrictions Comments: NWB to RLE Required Braces or Orthoses: Other Brace Other Brace: R post op shoe Restrictions Weight Bearing Restrictions Per Provider Order: Yes RLE Weight Bearing Per Provider Order: Non weight bearing     Mobility Bed Mobility Overal bed mobility: Needs Assistance Bed Mobility: Supine to Sit     Supine to sit: Supervision, Used rails     General bed mobility comments: S for safety to adhere to NWB precautions    Transfers Overall transfer level: Needs assistance Equipment used: Standard walker, 2 person hand held assist, Ambulation equipment used Transfers: Bed to chair/wheelchair/BSC, Sit to/from Stand Sit to Stand: Mod assist, +2 physical assistance       Anterior-Posterior transfers: +2 physical assistance, Min assist  Lateral/Scoot Transfers: Contact guard assist, +2 safety/equipment General transfer comment: Pt initally standing with mod +2 assist, requiring tactile cues to maintain NWB, unable to take pivotal steps for transfer. Pt CGA for lateral scooot to recliner, however BSC at home is standard and does not haved drop arm      Balance Overall balance assessment: Needs assistance, History of Falls Sitting-balance support: Feet supported, No upper extremity supported Sitting balance-Leahy Scale: Normal     Standing balance support: Reliant on assistive device for balance, Bilateral upper extremity supported Standing balance-Leahy Scale: Poor Standing balance comment: reliant on UE and external support           ADL either performed or assessed  with clinical judgement   ADL Overall ADL's : Needs assistance/impaired Eating/Feeding: Set up;Sitting   Grooming: Set up;Sitting   Upper Body  Bathing: Set up;Sitting   Lower Body Bathing: Moderate assistance   Upper Body Dressing : Set up;Sitting   Lower Body Dressing: Moderate assistance;Sit to/from stand   Toilet Transfer: Contact guard assist;Moderate assistance;Rolling walker (2 wheels) Toilet Transfer Details (indicate cue type and reason): CGA for lateral scoots, pt mod +2 for STS, unable to take pivotal hop         Functional mobility during ADLs: Minimal assistance;+2 for physical assistance;+2 for safety/equipment;Rolling walker (2 wheels) General ADL Comments: Pt limited by poor compliance with NWB precautions, decreased strength, and balance     Vision Baseline Vision/History: 0 No visual deficits Patient Visual Report: No change from baseline Vision Assessment?: No apparent visual deficits            Pertinent Vitals/Pain Pain Assessment Pain Assessment: Faces Faces Pain Scale: No hurt Pain Intervention(s): Monitored during session     Extremity/Trunk Assessment Upper Extremity Assessment Upper Extremity Assessment: Right hand dominant;Generalized weakness   Lower Extremity Assessment Lower Extremity Assessment: Defer to PT evaluation RLE Deficits / Details: weakness with L4 myotome. Hip flexion: 4 BLE, Quads: 4+ BLE, Hamstrings: 5 BLE, Dorsiflexion: 0, Plantarflexion: 1 RLE Sensation: decreased light touch;history of peripheral neuropathy RLE Coordination: decreased gross motor (unable to dorsiflex foot with limited activation of the dorsiflexion muscles)   Cervical / Trunk Assessment Cervical / Trunk Assessment: Normal   Communication Communication Communication: No apparent difficulties   Cognition Arousal: Alert Behavior During Therapy: WFL for tasks assessed/performed Cognition: Cognition impaired     Awareness: Online awareness impaired     Executive functioning impairment (select all impairments): Sequencing, Reasoning, Problem solving OT - Cognition Comments: Pt unaware of what  date she had surgery. Difficulty following verbal and visual commands. Despite verbally understanding. Poor insight into deficits                 Following commands: Impaired Following commands impaired: Follows one step commands inconsistently     Cueing  General Comments   Cueing Techniques: Verbal cues;Tactile cues;Visual cues  VSS on RA.           Home Living Family/patient expects to be discharged to:: Private residence Living Arrangements: Children Available Help at Discharge: Family;Available PRN/intermittently (granddaughter will be there when not working and daughter can check in when not working.) Type of Home: House Home Access: Stairs to enter (6 steps in back and front) Secretary/administrator of Steps: 6 Entrance Stairs-Rails: Can reach both Home Layout: One level     Bathroom Shower/Tub: Tub only   Firefighter: Standard Bathroom Accessibility: No   Home Equipment: Agricultural Consultant (2 wheels);Wheelchair - manual;BSC/3in1   Additional Comments: unsure if pt has besdie commode      Prior Functioning/Environment Prior Level of Function : Independent/Modified Independent     Mobility Comments: Pt reports mod I with use of RW ADLs Comments: Pt reports ind with ADLs    OT Problem List: Decreased strength;Decreased range of motion;Decreased activity tolerance;Impaired balance (sitting and/or standing);Decreased safety awareness;Decreased knowledge of precautions;Cardiopulmonary status limiting activity   OT Treatment/Interventions: Self-care/ADL training;Therapeutic exercise;Energy conservation;DME and/or AE instruction;Therapeutic activities;Patient/family education;Balance training      OT Goals(Current goals can be found in the care plan section)   Acute Rehab OT Goals Patient Stated Goal: To go home OT Goal Formulation: With patient Time For Goal Achievement: 03/28/24 Potential to Achieve  Goals: Good   OT Frequency:  Min 2X/week     Co-evaluation PT/OT/SLP Co-Evaluation/Treatment: Yes Reason for Co-Treatment: Complexity of the patient's impairments (multi-system involvement);For patient/therapist safety;To address functional/ADL transfers PT goals addressed during session: Mobility/safety with mobility;Balance;Proper use of DME OT goals addressed during session: ADL's and self-care      AM-PAC OT 6 Clicks Daily Activity     Outcome Measure Help from another person eating meals?: None Help from another person taking care of personal grooming?: A Little Help from another person toileting, which includes using toliet, bedpan, or urinal?: Total Help from another person bathing (including washing, rinsing, drying)?: A Lot Help from another person to put on and taking off regular upper body clothing?: A Little Help from another person to put on and taking off regular lower body clothing?: A Lot 6 Click Score: 15   End of Session Equipment Utilized During Treatment: Gait belt;Rolling walker (2 wheels);Other (comment) (Post-op shoe) Nurse Communication: Mobility status  Activity Tolerance: Patient tolerated treatment well Patient left: in chair;with call bell/phone within reach;with chair alarm set  OT Visit Diagnosis: Unsteadiness on feet (R26.81);Other abnormalities of gait and mobility (R26.89);Muscle weakness (generalized) (M62.81)                Time: 9196-9095 OT Time Calculation (min): 61 min Charges:  OT General Charges $OT Visit: 1 Visit OT Evaluation $OT Eval Moderate Complexity: 1 Mod OT Treatments $Self Care/Home Management : 8-22 mins  Adrianne BROCKS, OT  Acute Rehabilitation Services Office [phone] Secure chat preferred   Adrianne GORMAN Savers 03/14/2024, 10:44 AM

## 2024-03-14 NOTE — Evaluation (Addendum)
 Physical Therapy Evaluation Patient Details Name: Brandy Hall MRN: [id number] DOB: [birth year] Today's Date: 03/14/2024  History of Present Illness  64 y.o. female presents to [facility] hospital on 03/09/2024 with hypergylcemia. S/P R renal artery stent on 03/11/2024. Pt underwent R foot TMA revision on 1/07. PMH includes CAD, PAD, COPD, HTN, HLD, Trigeminal neuralgia, GERD and s/p R TMA 01/31/2024.  Clinical Impression  At baseline, pt was modified independent with all ADLs with intermittent walker use for mobility. Pt lives with granddaughter and has immediate family nearby but work full time. She lives in a one story home with 6 STE. Currently, pt requires extensive moderate assistance +2 with transfer to stand. She was unable to hop with max cues and assistance. Despite simulating her home environment and problem solving alternative ways to get to [facility], she was unable to do so without help while following her NWB precautions. Despite repeatedly going through the risks of placing weight on her RLE, risk for falls, and need for assistance pt was adamant about doing it independently when she returns home. The concerns are she has 6 STE and is unable to hop one time to try gait training and will be home for multiple hours at a time alone while her family is at work, making it difficult to safely use the bathroom while maintaining her precautions. Pt was able to perform bed mobility with supervision but requiring tactile/visual cues to remain NWB. Pt is currently limited by deficits in strength (specifically w/ R ankle mobility), R foot sensation, balance, gait training, skin integrity, endurance, and activity tolerance. She is at high risk of falls and will likely not adhere to her WB precautions w/o family supervising frequently and 24/7 support. OT called the daughter to discuss these concerns and daughter stated she was going to speak with other family members to discuss potentially arranging 24/7 care. If not able  to arrange, pt would benefit from short term inpatient rehab, <3hrs/day. Will continue to follow acutely.     If plan is discharge home, recommend the following: Two people to help with walking and/or transfers;Two people to help with bathing/dressing/bathroom;Assistance with cooking/housework;Help with stairs or ramp for entrance;Assist for transportation   Can travel by private vehicle   No    Equipment Recommendations BSC/3in1 (drop arm BSC)  Recommendations for Other Services       Functional Status Assessment Patient has had a recent decline in their functional status and/or demonstrates limited ability to make significant improvements in function in a reasonable and predictable amount of time     Precautions / Restrictions Precautions Precautions: Fall Recall of Precautions/Restrictions: Intact Required Braces or Orthoses: Other Brace Other Brace: R post op shoe Restrictions Weight Bearing Restrictions Per Provider Order: Yes RLE Weight Bearing Per Provider Order: Non weight bearing      Mobility  Bed Mobility Overal bed mobility: Needs Assistance Bed Mobility: Supine to Sit     Supine to sit: Supervision, Used rails     General bed mobility comments:  (pt needed frequent reminders to not push through R heel during bed mobility. Pt using rails to perform bed mobility.)    Transfers Overall transfer level: Needs assistance Equipment used: Rolling walker (2 wheels) Transfers: Bed to chair/wheelchair/BSC, Sit to/from Stand Sit to Stand: Mod assist, +2 physical assistance       Anterior-Posterior transfers: +2 physical assistance, Min assist  Lateral/Scoot Transfers: Contact guard assist, +2 safety/equipment General transfer comment: Attempted to perform stand or squat pivot and pt  was unable to do transfer safely. Attempted to simulate transferring to/from [facility] via squat and stand pivot and pt unable to problem solve despite cues to complete safely. Pt attempted to  simulate standing while holding onto a sink to hop and pt unable to complete safely. Pt attempted to scoot ant/post to [facility] from edge and bed and unable to complete w/o physical assistance. Pt was able to successfully laterally scoot EOB <> recliner with arm rest removed contact guard. Despite simulating home environment, pt was unable to transfer to [facility] safely while maintaining precautions and was adamant about safely doing it at home.    Ambulation/Gait               General Gait Details: Attempted to hop and cueing patient to place heavy weight through the walker and LLE and unable to hop even with max assistance.  Stairs            Wheelchair Mobility     Tilt Bed    Modified Rankin (Stroke Patients Only)       Balance Overall balance assessment: Needs assistance, History of Falls Sitting-balance support: Feet supported, No upper extremity supported Sitting balance-Leahy Scale: Normal Sitting balance - Comments: Can reach off center of gravity to manage post op shoe without LOB.   Standing balance support: Reliant on assistive device for balance, Bilateral upper extremity supported Standing balance-Leahy Scale: Poor Standing balance comment: unable to adhere to NWB precautions and required tactile and visual cues to remain NWB and unable to stand without support. Pt remained unsteady on feet and continued to bear weight through R foot despite ongoing cues to be NWB.                             Pertinent Vitals/Pain Pain Assessment Pain Assessment: Faces Faces Pain Scale: No hurt Pain Intervention(s): Monitored during session    Home Living Family/patient expects to be discharged to:: (P) Private residence Living Arrangements: (P) Children Available Help at Discharge: (P) Family;Available PRN/intermittently (granddaughter will be there when not working and daughter can check in when not working.) Type of Home: (P) House Home Access: (P) Stairs to  enter (6 steps in back and front) Entrance Stairs-Rails: (P) Can reach both Entrance Stairs-Number of Steps: (P) 6   Home Layout: (P) One level Home Equipment: (P) Rolling Walker (2 wheels);Wheelchair - manual Additional Comments: (P) unsure if pt has besdie commode    Prior Function Prior Level of Function : Independent/Modified Independent             Mobility Comments: (P) RW PRN pt reports ambulating for balance only and walking w/o assistive device. Intermittently holding onto furniture       Extremity/Trunk Assessment   Upper Extremity Assessment Upper Extremity Assessment: Right hand dominant;Defer to OT evaluation    Lower Extremity Assessment Lower Extremity Assessment: RLE deficits/detail RLE Deficits / Details: weakness with L4 myotome. Hip flexion: 4 BLE, Quads: 4+ BLE, Hamstrings: 5 BLE, Dorsiflexion: 0, Plantarflexion: 1 RLE Sensation: decreased light touch;history of peripheral neuropathy RLE Coordination: decreased gross motor (unable to dorsiflex foot with limited activation of the dorsiflexion muscles)    Cervical / Trunk Assessment Cervical / Trunk Assessment: Normal  Communication   Communication Communication: No apparent difficulties    Cognition Arousal: Alert Behavior During Therapy: WFL for tasks assessed/performed   PT - Cognitive impairments: No family/caregiver present to determine baseline  PT - Cognition Comments: Pt needed frequent reminders to be NWB and therapist attempted to simulate home environment and pt unable to maintain transfer to toilet without maintaining precautions. Pt was adamant about being able to do transfers properly when at home yet unable to do it while here. Following commands: Impaired Following commands impaired: Follows multi-step commands inconsistently, Follows multi-step commands with increased time     Cueing Cueing Techniques: Verbal cues, Tactile cues, Visual cues      General Comments General comments (skin integrity, edema, etc.): wound bandage on R foot. VSS on room air. Extensively educated pt on her risk for falls, current need for assistance when going to bathroom, risk of not adhering to NWB precautions, and recommendation to only use w/c to mobilize when alone. OT called daughter to discuss concerns as well and daughter/family to arrange 24/7 safe d/c home. Pt verbalized understading.    Exercises     Assessment/Plan    PT Assessment Patient needs continued PT services  PT Problem List Decreased strength;Decreased range of motion;Decreased balance;Decreased mobility;Decreased knowledge of precautions;Decreased activity tolerance;Decreased safety awareness;Decreased knowledge of use of DME;Impaired sensation;Decreased skin integrity       PT Treatment Interventions Gait training;Functional mobility training;Balance training;Patient/family education;DME instruction;Stair training;Therapeutic exercise;Therapeutic activities;Neuromuscular re-education;Wheelchair mobility training    PT Goals (Current goals can be found in the Care Plan section)  Acute Rehab PT Goals Patient Stated Goal: to go home with family PT Goal Formulation: With patient Time For Goal Achievement: 03/28/24 Potential to Achieve Goals: Fair    Frequency Min 3X/week     Co-evaluation PT/OT/SLP Co-Evaluation/Treatment:  (Simultaneous filing. User may not have seen previous data.) Reason for Co-Treatment: (P) Complexity of the patient's impairments (multi-system involvement);For patient/therapist safety;To address functional/ADL transfers PT goals addressed during session: (P) Mobility/safety with mobility;Balance;Proper use of DME         AM-PAC PT 6 Clicks Mobility  Outcome Measure Help needed turning from your back to your side while in a flat bed without using bedrails?: A Little Help needed moving from lying on your back to sitting on the side of a flat bed without  using bedrails?: A Little Help needed moving to and from a bed to a chair (including a wheelchair)?: A Little Help needed standing up from a chair using your arms (e.g., wheelchair or bedside chair)?: Total Help needed to walk in hospital room?: Total Help needed climbing 3-5 steps with a railing? : Total 6 Click Score: 12    End of Session Equipment Utilized During Treatment: Gait belt Activity Tolerance: Patient limited by fatigue Patient left: in chair;with call bell/phone within reach;with chair alarm set Nurse Communication: Mobility status PT Visit Diagnosis: Unsteadiness on feet (R26.81);Repeated falls (R29.6);History of falling (Z91.81);Difficulty in walking, not elsewhere classified (R26.2)    Time: 9196-9094 PT Time Calculation (min) (ACUTE ONLY): 62 min   Charges:   PT Evaluation $PT Eval Moderate Complexity: 1 Mod PT Treatments $Therapeutic Activity: 8-22 mins PT General Charges $$ ACUTE PT VISIT: 1 Visit         Murtis CHRISTELLA Ferries, SPT   Deatra Mcmahen 03/14/2024, 10:36 AM

## 2024-03-14 NOTE — TOC Transition Note (Signed)
 Transition of Care (TOC) - Discharge Note Brandy Gobble RN, BSN Inpatient Care Management Unit 4E- RN Case Manager See Treatment Team for direct phone #   Patient Details  Name: Brandy Hall MRN: [id number] Date of Birth: 03-23-1960  Transition of Care [facility]) CM/SW Contact:  Hall Brandy Hurst, RN Phone Number: 03/14/2024, 3:35 PM   Clinical Narrative:    Pt stable for transition home today, per therapy recommendations for STSNF. Pt is declining SNF and prefers to return home.   Per MD he has spoken with daughter Brandy Hall who is aware of SNF recommendations and understands pt is refusing at this time.   CM in to speak with pt at bedside- discussed therapy recommendations and why- pt voiced that she understands the concern however wants to go home. Pt states she lives with her grand-daughter and her children mainly. She voiced that she will not have 24/7 assist and is mainly home alone during day while family is at work or school- but insist that she will have other family members that will come check in on her until family gets home in the afternoon. Pt voiced that she is aware of risk and concern from hospital staff and family as she is not as strong as she was but adamant about returning home.  MD also came to bedside during conversation, and voiced concern from daughter Brandy Hall and staff with regards to pt returning home- pt still refuses SNF placement- plan to return home w/ [facility].- MD will f/u with daughter Brandy Hall.  Pt voiced she has needed DME- including wheelchair, rolling walker, BSC, shower chair.  Family (daughter Brandy Hall) will transport home when off of work later.   Pt agreeable to [facility] services- orders placed for HHRN/PT/OT - Choice offered for [facility] provider- pt voiced she has used an agency in past but can not remember name- pt agreeable to have referral placed in Hub to agencies that take her insurance.   Referral for [facility] placed in Hub- [facility] and [facility] can accept. [facility] can do  start of care either 1/9 or 1/10- HH services confirmed with [facility] who will f/u to schedule.   IP CM interventions have been completed no further needs noted.   Final next level of care: Home w Home Health Services Barriers to Discharge: Barriers Resolved   Patient Goals and CMS Choice Patient states their goals for this hospitalization and ongoing recovery are:: wants to return home and recover   Choice offered to / list presented to : Patient      Discharge Placement               Home w/ [facility]        Discharge Plan and Services Additional resources added to the After Visit Summary for     Discharge Planning Services: CM Consult Post Acute Care Choice: Home Health          DME Arranged: N/A DME Agency: NA       HH Arranged: RN, PT, OT HH Agency: [facility] Date [facility] Agency Contacted: 03/14/24 Time HH Agency Contacted: [id number] Representative spoke with at [facility] Agency: Hub  Social Drivers of Health (SDOH) Interventions SDOH Screenings   Food Insecurity: Patient Declined (03/13/2024)  Housing: Unknown (03/13/2024)  Transportation Needs: Patient Declined (03/13/2024)  Utilities: Patient Declined (03/13/2024)  Social Connections: Unknown (03/13/2024)  Tobacco Use: High Risk (03/13/2024)     Readmission Risk Interventions    03/14/2024    3:35 PM 03/14/2024  3:34 PM  Readmission Risk Prevention Plan  Transportation Screening  Complete  PCP or Specialist Appt within 5-7 Days Not Complete   Not Complete comments per AVS f/u in 2 wks   Home Care Screening  Complete  Medication Review (RN CM)  Complete

## 2024-03-14 NOTE — Discharge Summary (Signed)
 " Physician Discharge Summary   Patient: Brandy Hall MRN: [id number] DOB: 10/04/1960  Admit date:     03/09/2024  Discharge date: 03/15/2024  Discharge Physician: Garnette Pelt   PCP: Marelyn Quill, MD   Recommendations at discharge:    Follow up with PCP in 1-2 weeks Follow up with Podiatry as scheduled  Discharge Diagnoses: Principal Problem:   Diabetic foot infection (HCC) Active Problems:   Gangrene of right foot (HCC)   DM2 (diabetes mellitus, type 2) (HCC)   Tobacco abuse   COPD (chronic obstructive pulmonary disease) (HCC)   Coronary artery disease involving native coronary artery of native heart with angina pectoris   HLD (hyperlipidemia)   History of essential hypertension   Hyperglycemia   Lactic acidosis   Hypokalemia   Acute hyponatremia   History of COPD   Chronic multifocal osteomyelitis of right foot (HCC)  Resolved Problems:   * No resolved hospital problems. *  Hospital Course: 64 year old female with diabetes mellitus type 2,  diabetic peripheral neuropathy, right-sided diabetic foot wound status post TMA on 01/31/2024, COPD, HTN, HLP, severely noncompliant presented to ED complaining of polyuria, feeling sick, somewhat confused, dizziness, dysuria for the last 4 days..  Patient reported that she has not been taking any of her medications for last several days including insulin .  Patient was hospitalized 01/29/2024-02/06/2024 for sepsis and gangrene of toe of right foot, underwent TMA on 11/26 with podiatry, Dr. Malvin.  She was discharged to SNF, then subsequently discharged from SNF back to home.   In the last 2 to 3 days, also noted increasing swelling associated with right TMA wound, increasing erythema and drainage.  Per chart review, she had recently completed a course of Augmentin  and finishing a course of Macrobid  for UTI at this time   In ED, initial CBG 554, BHB 0.08, VBG pH 7.37.  Sodium 121, potassium 3.1, creatinine 1.0. UA negative.  UDS  negative.  Initial lactate 2.7-> 2.9   Patient was started on IV antibiotics and admitted for further workup.  Assessment and Plan: TMA -Concern for potential postoperative wound infection in the setting of noncompliance, hyperglycemia poor wound care - MRI foot with postop abscesses noted along the metatarsal amputation sites, T1 and T2 signal changes at osteotomy sites highly suspicious for osteomyelitis, cellulitis -Vascular surgery consulted, underwent angiogram for right renal stenosis and already on 1/5 -Podiatry following, now s/p right foot TMA revision - Pt was continued on IV linezolid , cefepime , narrow to bactrim  on d/c -Per Podiatry, rec for non-weightbearing. PT consulted, recs for SNF -Spent ample time with Case Manager in room discussing recommendation for SNF. Shared family's concern over lack of 24/7 supervision at home and pt's increased weakness. Despite long discussion, pt still insistent on discharging home and not SNF. -Pt is alert and oriented, demonstrated insight to her care.  -Discussed with Vascular Surgeon, recs to continue plavix  with eliquis , per below. Stop aspirin     Diabetes mellitus type 2, IDDM, uncontrolled with hyperglycemia, severely noncompliant - Presented with CBG of 554, hemoglobin A1c 10.0 - cont Lantus  12 units daily, NovoLog  meal coverage 4 units 3 times daily AC, moderate SSI  while in hospital    Lactic acidosis - Likely due to dehydration, no sepsis.  Improved with IV fluids   Hyponatremia - Likely pseudohyponatremia due to hyperglycemia on admission, NA 121.   - Sodium now improved   Acute kidney injury -  creatinine improved with hydration   Severe hypokalemia - Improved, replace as  needed     Tobacco abuse -Counseled on smoking cessation, continue nicotine  patch      COPD (chronic obstructive pulmonary disease) (HCC) -Currently no acute wheezing, continue albuterol  nebs as needed   Hypertension, CAD - BP initially soft,   Lopressor , amlodipine  held -continue plavix    L arm DVT -L arm swelling noted -LUE doppler noted DVT in L radial as well as superifcial clot in cephalic v -initially started therapeutic lovenox , tolerated -Discussed with podiatry and vascular surgeon. OK for eliquis . -Per Vascular Surgeon, recommendation to stop ASA. Continue plavix  per above     Consultants: Podiatry, Vascular Surgery Procedures performed: Procedure:  1.  Revision transmetatarsal amputation with further resection of metatarsals 1 through 5, right foot 2.  Application amniotic allograft 5 x 5 cm, right foot  Disposition: Home Diet recommendation:  Carb modified diet DISCHARGE MEDICATION: Allergies as of 03/15/2024       Reactions   Crestor [rosuvastatin] Other (See Comments)   Myalgias    Percocet [oxycodone -acetaminophen ] Hives   Statins Other (See Comments)   Per patient shuts down organs   Trulicity [dulaglutide] Nausea And Vomiting, Nausea Only, Other (See Comments)   Pancreatitis         Medication List     STOP taking these medications    amLODipine  5 MG tablet Commonly known as: NORVASC    amoxicillin -clavulanate 875-125 MG tablet Commonly known as: AUGMENTIN    ASPIRIN  81 PO   Macrobid  100 MG capsule Generic drug: nitrofurantoin  (macrocrystal-monohydrate)   metoprolol  tartrate 25 MG tablet Commonly known as: LOPRESSOR    montelukast  10 MG tablet Commonly known as: SINGULAIR    multivitamin capsule   potassium chloride  10 MEQ tablet Commonly known as: KLOR-CON    Trelegy Ellipta 200-62.5-25 MCG/ACT Aepb Generic drug: Fluticasone-Umeclidin-Vilant       TAKE these medications    albuterol  108 (90 Base) MCG/ACT inhaler Commonly known as: VENTOLIN  HFA Inhale 2 puffs into the lungs every 6 (six) hours as needed for wheezing or shortness of breath.   clopidogrel  75 MG tablet Commonly known as: PLAVIX  Take 75 mg by mouth daily.   docusate sodium  100 MG capsule Commonly known as:  COLACE Take 1 capsule (100 mg total) by mouth daily.   Eliquis  DVT/PE Starter Pack Generic drug: Apixaban  Starter Pack (10mg  and 5mg ) Take as directed on package: start with two-5mg  tablets twice daily for 7 days. On day 8, switch to one-5mg  tablet twice daily.   ezetimibe  10 MG tablet Commonly known as: ZETIA  Take 10 mg by mouth daily.   HYDROcodone -acetaminophen  5-325 MG tablet Commonly known as: NORCO/VICODIN Take 1 tablet by mouth every 6 (six) hours as needed for severe pain (pain score 7-10). What changed:  how much to take additional instructions   Insulin  Aspart FlexPen 100 UNIT/ML Commonly known as: NOVOLOG  Inject 0-10 Units into the skin with breakfast, with lunch, and with evening meal. Sliding Scale   ipratropium-albuterol  0.5-2.5 (3) MG/3ML Soln Commonly known as: DUONEB Take 3 mLs by nebulization every 6 (six) hours as needed.   Lantus  SoloStar 100 UNIT/ML Solostar Pen Generic drug: insulin  glargine Inject 12 Units into the skin daily.   nicotine  21 mg/24hr patch Commonly known as: NICODERM CQ  - dosed in mg/24 hours Place 1 patch (21 mg total) onto the skin daily.   nicotine  polacrilex 2 MG gum Commonly known as: NICORETTE  Take 1 each (2 mg total) by mouth as needed for smoking cessation.   nitroGLYCERIN  0.4 MG SL tablet Commonly known as: NITROSTAT  Place 0.4  mg under the tongue every 5 (five) minutes as needed for chest pain.   pregabalin  50 MG capsule Commonly known as: LYRICA  Take 1 capsule (50 mg total) by mouth 3 (three) times daily.   Ranexa  500 MG 12 hr tablet Generic drug: ranolazine  Take 500 mg by mouth 2 (two) times daily.   sulfamethoxazole -trimethoprim  800-160 MG tablet Commonly known as: BACTRIM  DS Take 1 tablet by mouth every 12 (twelve) hours for 3 days.   VITAMIN D -3 PO Take 1 tablet by mouth daily.         Contact information for follow-up providers     [facility]  Follow up in 6 week(s).   Specialty: Vascular Surgery Contact information: [address] [place]  [address] [phone]        Marelyn Quill, MD Follow up in 2 week(s).   Specialty: Family Medicine Why: Hospital follow up Contact information: [address] [place] [address] [phone]              Contact information for after-discharge care     Home Medical Care     [facility]) .   Service: Home Health Services Why: HHRN/PT/OT arranged- they will contact you to schedule- anticipate intial visit by 1/10. Contact information: [address] [place]  [address] [phone]                    Discharge Exam: Fredricka Weights   03/13/24 0256 03/14/24 0406 03/15/24 0431  Weight: 81.9 kg 78.7 kg 78.7 kg   General exam: Awake, laying in bed, in nad Respiratory system: Normal respiratory effort, no wheezing Cardiovascular system: regular rate, s1, s2 Gastrointestinal system: Soft, nondistended, positive BS Central nervous system: CN2-12 grossly intact, strength intact Extremities: Perfused, no clubbing Skin: Normal skin turgor, no notable skin lesions seen Psychiatry: Mood normal // no visual hallucinations   Decision making capacity evaluated. No barriers to communication is present. There are also no reversible causes of incapacity apparent. The patient understands the medical situation and is aware of all available options of the situation. Pt is alert and oriented x 3. Pt is able to verbalize risk of not being non-weight bearing or being noncompliant with treatment  Condition at discharge: fair  The results of significant diagnostics from this hospitalization (including imaging, microbiology, ancillary and laboratory) are listed below for reference.   Imaging Studies: VAS US  UPPER EXTREMITY VENOUS DUPLEX Result Date: 03/14/2024 UPPER VENOUS STUDY  Patient Name:  MERRY POND  Date of Exam:   03/14/2024 Medical Rec #: [id number]         Accession #:    [id number] Date of Birth: 1960-04-05         Patient Gender: F Patient Age:   59 years Exam Location:  [facility] Procedure:      VAS US  UPPER EXTREMITY VENOUS DUPLEX Referring Phys: TIMOTHY OPYD --------------------------------------------------------------------------------  Indications: Swelling, and rule out DVT. Comparison Study: No prior exam. Performing Technologist: Edilia Elden Appl  Examination Guidelines: A complete evaluation includes B-mode imaging, spectral Doppler, color Doppler, and power Doppler as needed of all accessible portions of each vessel. Bilateral testing is considered an integral part of a complete examination. Limited examinations for reoccurring indications may be performed as noted.  Right Findings: +----------+------------+---------+-----------+----------+-------+ RIGHT     CompressiblePhasicitySpontaneousPropertiesSummary +----------+------------+---------+-----------+----------+-------+ IJV  Full       Yes       Yes                      +----------+------------+---------+-----------+----------+-------+ Subclavian    Full       Yes       Yes                      +----------+------------+---------+-----------+----------+-------+  Left Findings: +----------+------------+---------+-----------+----------+-------+ LEFT      CompressiblePhasicitySpontaneousPropertiesSummary +----------+------------+---------+-----------+----------+-------+ IJV           Full       Yes       Yes                      +----------+------------+---------+-----------+----------+-------+ Subclavian    Full       Yes       Yes                      +----------+------------+---------+-----------+----------+-------+ Axillary      Full       Yes       Yes                      +----------+------------+---------+-----------+----------+-------+ Brachial      Full        Yes       Yes                      +----------+------------+---------+-----------+----------+-------+ Radial      Partial      No        No                       +----------+------------+---------+-----------+----------+-------+ Ulnar         Full                                          +----------+------------+---------+-----------+----------+-------+ Cephalic      None       No        No                       +----------+------------+---------+-----------+----------+-------+ Basilic       Full       Yes       Yes                      +----------+------------+---------+-----------+----------+-------+ Focal deep vein thrombosis noted in the middle segment of one of the paired radial vein. Superficial vein thrombosis noted in the cephalic vein from the shoulder to the middle forearm.  Summary:  Right: No evidence of thrombosis in the subclavian.  Left: Findings consistent with acute focal deep vein thrombosis involving the left radial veins. Findings consistent with acute superficial vein thrombosis involving the left cephalic vein.  *See table(s) above for measurements and observations.  Diagnosing physician: Debby Robertson Electronically signed by Debby Robertson on 03/14/2024 at 8:20:18 PM.    Final    DG Foot 2 Views Right Result Date: 03/13/2024 EXAM: 1 or 2 VIEW(S) XRAY OF THE FOOT 03/13/2024 10:36:00 AM COMPARISON: 1 / 3 / 26. CLINICAL HISTORY: Post-operative state. FINDINGS: BONES AND JOINTS: Status post forefoot amputation revision . No acute fracture or malalignment of the remaining osseous structures.  SOFT TISSUES: Surgical staples at amputation sites noted. Expected soft tissue edema and subcutaneous gas. IMPRESSION: 1. Status post forefoot amputation revision with expected postoperative changes. Electronically signed by: Norman Gatlin MD 03/13/2024 02:23 PM EST RP Workstation: [id number]   PERIPHERAL VASCULAR CATHETERIZATION Result Date: 03/11/2024 Images from the  original result were not included. Patient name: YANAI HOBSON MRN: [id number] DOB: 1960-03-26 Sex: female 03/11/2024 Pre-operative Diagnosis: Right renal artery stenosis, nonhealing right transmetatarsal amputation Post-operative diagnosis:  Same Surgeon:  Penne C. Sheree, MD Procedure Performed: 1.  Percutaneous ultrasound-guided cannulation left common femoral artery 2.  Catheter selection of aorta and aortogram 3.  Stent right renal artery with 6 x 19 mm VBX 4.  Catheter selection right common femoral artery and right lower extremity angiogram 5.  Moderate sedation with fentanyl  and Versed  for 46 minutes Indications: 64 year old female with history of aortoiliac endovascular reconstruction for aortoiliac occlusive disease with toe ulceration.  She is now status post right transmetatarsal amputation which is nonhealing and she is indicated for right lower extremity angiography.  She also has right renal artery occlusion after stenting of the aorta and is indicated for possible renal artery stenting. Findings: The right renal artery was occluded at the ostium and appeared to fill late and after selecting the renal artery there appeared to be thrombus in the proximal renal artery.  This was stented to 0% residual stenosis and briskly fill to completion.  The aortoiliac reconstruction including the bilateral common iliac arteries was patent as of the bilateral hypogastric arteries and bilateral external iliac arteries.  The right common femoral artery, SFA, popliteal arteries are all patent.  There is a high takeoff of the tibioperoneal trunk and there is three-vessel runoff via the anterior tibial, peroneal and posterior tibial arteries.  Procedure:  The patient was identified in the holding area and taken to room 8.  The patient was then placed supine on the table and prepped and draped in the usual sterile fashion.  A time out was called.  Ultrasound was used to evaluate the left common femoral artery which was  noted to be patent and there was a palpable pulse.  The area was anesthetized 1% lidocaine  and cannulated with a micropuncture needle followed by wire and a sheath.  An ultrasound image was saved to the permanent record and concomitantly we administered fentanyl  and Versed  as moderate sedation her vital signs were monitored throughout the case.  We placed a Bentson wire followed by 5 French sheath and Omni catheter just to the level of the top of the graft and performed aortogram.  With the above findings we placed a 6.5 French steerable sheath and the patient was fully heparinized.  We were able to select the right renal artery using Bentson wire and a steerable sheath and we confirmed intraluminal access with a quick cross catheter.  We then primarily stented the renal artery with a 6 x 19 mm VBX inflated to nominal pressure and completion demonstrated brisk flow into the renal artery and the kidney did fill briskly.  We then crossed the bifurcation using a crossover catheter and the steerable sheath and Glidewire followed by a Navi cross catheter we then performed right lower extremity angiography with the above findings no intervention was undertaken.  We then removed the sheath over the wire as well as the catheter and placed a 6 French sheath and then performed completion aortogram at the level of the renal arteries which demonstrated patency of the hypogastric's and the aortoiliac  stenting as well as the bilateral renal arteries.  We then removed the wire and deployed a minx device.  She tolerated the procedure well and Ameeth complication. Contrast: 65 cc Brandon C. Sheree, MD [facility] Office: [phone] Pager: [phone]   VAS US  LOWER EXTREMITY ARTERIAL DUPLEX Result Date: 03/11/2024 LOWER EXTREMITY ARTERIAL DUPLEX STUDY Patient Name:  ISALY FASCHING  Date of Exam:   03/11/2024 Medical Rec #: [id number]         Accession #:    [id number] Date of Birth: [birth year]          Patient Gender: F Patient Age:   47 years Exam Location:  [facility] Procedure:      VAS US  LOWER EXTREMITY ARTERIAL DUPLEX Referring Phys: ETHAN SEMON --------------------------------------------------------------------------------  Indications: Peripheral artery disease. High Risk Factors: Hypertension, hyperlipidemia, Diabetes, current smoker, prior                    MI, coronary artery disease. Other Factors: RLE TMA 01/31/2024.  Vascular               01/23/2024 CERAB with aortic and bilateral iliac Interventions:         stenting. Current ABI:           1.07 / 1.03 Comparison Study: RLE 01/19/2024 & LLE 07/03/2023 Performing Technologist: Ezzie Potters RVT, RDMS  Examination Guidelines: A complete evaluation includes B-mode imaging, spectral Doppler, color Doppler, and power Doppler as needed of all accessible portions of each vessel. Bilateral testing is considered an integral part of a complete examination. Limited examinations for reoccurring indications may be performed as noted.  +-----------+--------+-----+--------+---------+--------+ RIGHT      PSV cm/sRatioStenosisWaveform Comments +-----------+--------+-----+--------+---------+--------+ CFA Prox   123                  triphasic         +-----------+--------+-----+--------+---------+--------+ CFA Distal 142                  triphasic         +-----------+--------+-----+--------+---------+--------+ DFA        101                  biphasic          +-----------+--------+-----+--------+---------+--------+ SFA Prox   118                  triphasic         +-----------+--------+-----+--------+---------+--------+ SFA Mid    104                  triphasic         +-----------+--------+-----+--------+---------+--------+ SFA Distal 72                   triphasic         +-----------+--------+-----+--------+---------+--------+ POP Prox   77                   triphasic          +-----------+--------+-----+--------+---------+--------+ POP Mid    51                   triphasic         +-----------+--------+-----+--------+---------+--------+ POP Distal 64                   triphasic         +-----------+--------+-----+--------+---------+--------+ TP Trunk   77  triphasic         +-----------+--------+-----+--------+---------+--------+ ATA Prox   89                   triphasic         +-----------+--------+-----+--------+---------+--------+ ATA Mid    63                   triphasic         +-----------+--------+-----+--------+---------+--------+ ATA Distal 70                   triphasic         +-----------+--------+-----+--------+---------+--------+ PTA Prox   83                   triphasic         +-----------+--------+-----+--------+---------+--------+ PTA Mid    75                   triphasic         +-----------+--------+-----+--------+---------+--------+ PTA Distal 77                   triphasic         +-----------+--------+-----+--------+---------+--------+ PERO Prox  56                   triphasic         +-----------+--------+-----+--------+---------+--------+ PERO Mid   47                   triphasic         +-----------+--------+-----+--------+---------+--------+ PERO Distal45                   triphasic         +-----------+--------+-----+--------+---------+--------+ DP         37                   triphasic         +-----------+--------+-----+--------+---------+--------+  +-----------+--------+-----+--------+---------+--------+ LEFT       PSV cm/sRatioStenosisWaveform Comments +-----------+--------+-----+--------+---------+--------+ CFA Prox   105                  triphasic         +-----------+--------+-----+--------+---------+--------+ CFA Distal 108                  triphasic         +-----------+--------+-----+--------+---------+--------+ DFA         56                   biphasic          +-----------+--------+-----+--------+---------+--------+ SFA Prox   89                   triphasic         +-----------+--------+-----+--------+---------+--------+ SFA Mid    81                   biphasic          +-----------+--------+-----+--------+---------+--------+ SFA Distal 51                   biphasic          +-----------+--------+-----+--------+---------+--------+ POP Prox   52                   triphasic         +-----------+--------+-----+--------+---------+--------+ POP Mid    43  biphasic          +-----------+--------+-----+--------+---------+--------+ POP Distal 55                   biphasic          +-----------+--------+-----+--------+---------+--------+ TP Trunk   53                   triphasic         +-----------+--------+-----+--------+---------+--------+ ATA Prox   62                   triphasic         +-----------+--------+-----+--------+---------+--------+ ATA Mid    68                   triphasic         +-----------+--------+-----+--------+---------+--------+ ATA Distal 58                   triphasic         +-----------+--------+-----+--------+---------+--------+ PTA Prox   59                   biphasic          +-----------+--------+-----+--------+---------+--------+ PTA Mid    54                   biphasic          +-----------+--------+-----+--------+---------+--------+ PTA Distal 16                   biphasic          +-----------+--------+-----+--------+---------+--------+ PERO Prox  51                   biphasic          +-----------+--------+-----+--------+---------+--------+ PERO Mid   51                   biphasic          +-----------+--------+-----+--------+---------+--------+ PERO Distal27                   biphasic          +-----------+--------+-----+--------+---------+--------+ DP         53                    triphasic         +-----------+--------+-----+--------+---------+--------+  Summary: Bilateral: No evidence of stenosis.  See table(s) above for measurements and observations. Electronically signed by Lonni Gaskins MD on 03/11/2024 at 11:35:27 AM.    Final    VAS US  ABI WITH/WO TBI Result Date: 03/11/2024  LOWER EXTREMITY DOPPLER STUDY Patient Name:  SHAIANN MCMANAMON  Date of Exam:   03/11/2024 Medical Rec #: [id number]         Accession #:    [id number] Date of Birth: [birth year]         Patient Gender: F Patient Age:   49 years Exam Location:  [facility] Procedure:      VAS US  ABI WITH/WO TBI Referring Phys: ETHAN SEMON --------------------------------------------------------------------------------  Indications: Peripheral artery disease. High Risk Factors: Hypertension, hyperlipidemia, Diabetes, current smoker, prior                    MI, coronary artery disease. Other Factors: RLE TMA 01/31/2024.  Vascular Interventions: CERAB with aortic and bilateral iliac stenting on  01/23/2024. Comparison Study: previous exam was on 01/30/2024 Performing Technologist: Leigh Rom RVT/RDMS  Examination Guidelines: A complete evaluation includes at minimum, Doppler waveform signals and systolic blood pressure reading at the level of bilateral brachial, anterior tibial, and posterior tibial arteries, when vessel segments are accessible. Bilateral testing is considered an integral part of a complete examination. Photoelectric Plethysmograph (PPG) waveforms and toe systolic pressure readings are included as required and additional duplex testing as needed. Limited examinations for reoccurring indications may be performed as noted.  ABI Findings: +---------+------------------+-----+---------+--------+ Right    Rt Pressure (mmHg)IndexWaveform Comment  +---------+------------------+-----+---------+--------+ Brachial 192                    triphasic          +---------+------------------+-----+---------+--------+ PTA      205               1.07 triphasic         +---------+------------------+-----+---------+--------+ DP       191               0.99 triphasic         +---------+------------------+-----+---------+--------+ Great Toe                                TMA      +---------+------------------+-----+---------+--------+ +---------+------------------+-----+---------+----------------+ Left     Lt Pressure (mmHg)IndexWaveform Comment          +---------+------------------+-----+---------+----------------+ Brachial 190                    triphasic                 +---------+------------------+-----+---------+----------------+ PTA      198               1.03 biphasic audibly biphasic +---------+------------------+-----+---------+----------------+ DP       198               1.03 triphasic                 +---------+------------------+-----+---------+----------------+ Great Toe175               0.91 Normal                    +---------+------------------+-----+---------+----------------+ +-------+-----------+-----------+------------+------------+ ABI/TBIToday's ABIToday's TBIPrevious ABIPrevious TBI +-------+-----------+-----------+------------+------------+ Right  1.07       amputation 0.96        absent       +-------+-----------+-----------+------------+------------+ Left   1.03       0.91       0.86        0.68         +-------+-----------+-----------+------------+------------+  Summary: Right: Resting right ankle-brachial index is within normal range.  Left: Resting left ankle-brachial index is within normal range. The left toe-brachial index is normal.  *See table(s) above for measurements and observations.  Electronically signed by Lonni Gaskins MD on 03/11/2024 at 11:34:43 AM.    Final    CT ANGIO ABDOMEN PELVIS  W & WO CONTRAST Addendum Date: 03/11/2024 ADDENDUM REPORT: 03/11/2024 11:26  ADDENDUM: **An incidental finding of potential clinical significance has been found. 8 mm low-density or cystic structure along the posterior aspect of the pancreatic head. This could represent a small cystic lesion but indeterminate. Recommend follow up pre and post contrast MRI/MRCP or pancreatic protocol CT in 2 years. This recommendation follows ACR consensus guidelines: Management of Incidental Pancreatic  Cysts: A White Paper of the ACR Incidental Findings Committee. J Am Coll Radiol 2017;14:911-923.** These results will be called to the ordering clinician or representative by the Radiologist Assistant, and communication documented in the PACS or [employer]. Electronically Signed   By: Juliene Balder M.D.   On: 03/11/2024 11:26   Result Date: 03/11/2024 CLINICAL DATA:  Status post abdominal aortic aneurysm repair. Status post covered endovascular reconstruction of aortic bifurcation on 01/23/2024. EXAM: CTA ABDOMEN AND PELVIS WITHOUT AND WITH CONTRAST TECHNIQUE: Multidetector CT imaging of the abdomen and pelvis was performed using the standard protocol during bolus administration of intravenous contrast. Multiplanar reconstructed images and MIPs were obtained and reviewed to evaluate the vascular anatomy. RADIATION DOSE REDUCTION: This exam was performed according to the departmental dose-optimization program which includes automated exposure control, adjustment of the mA and/or kV according to patient size and/or use of iterative reconstruction technique. CONTRAST:  80mL OMNIPAQUE  IOHEXOL  350 MG/ML SOLN COMPARISON:  04/13/2022 and 01/20/2024 FINDINGS: VASCULAR Aorta: Aortic stent graft with bilateral iliac limbs have been placed. This is a transrenal aortic stent graft that extends above the renal arteries. Aortic stent graft is patent. Atherosclerotic disease in the abdominal aorta. Infrarenal abdominal aorta measures 3.0 cm and previously measured 3.1 cm. No evidence for an endoleak. Celiac: Celiac trunk  is widely patent. Main branch vessels are patent. Incidentally, there is an accessory left hepatic artery originating from the left gastric artery and this is normal variant. SMA: Patent without evidence of aneurysm, dissection, vasculitis or significant stenosis. Renals: Aortic stent covers bilateral renal arteries. Left renal artery remains patent without aneurysm or dissection. However, there is new thrombus at the origin of the right renal artery with near occlusion. There is flow in the right renal artery. Decreased enhancement in the right kidney compared to the left. No evidence for an aneurysm or dissection involving the right renal artery. IMA: Origin and proximal aspect of the IMA is occluded. Distal reconstitution of the IMA branches. Inflow: Reconstruction of the aortic bifurcation with bilateral common iliac artery stents. Bilateral iliac artery stents are patent. Right common iliac artery measures 1.6 cm. Left common iliac artery measures 1.5 cm. Stable stenosis and post stenotic dilatation in the proximal left internal iliac artery. Again noted is stenosis at origin of the right internal iliac artery. Bilateral external iliac arteries have calcified plaque without significant stenosis. Proximal Outflow: Proximal femoral arteries are patent bilaterally without atherosclerotic disease Veins: Normal appearance of the IVC and iliac veins. Bilateral renal veins are patent. Main portal venous system is patent. Review of the MIP images confirms the above findings. NON-VASCULAR Lower chest: Probable scarring at the base of the right middle lobe. No acute abnormality in visualized lung bases. Hepatobiliary: Cholecystectomy. Normal appearance of the liver. No biliary dilatation. Pancreas: Poorly defined low-density structure along the posterior aspect of the pancreatic head on image 47, sequence 32 measures 8 mm. In retrospect, this has not significantly changed since 2024. This low-density cystic structure is  in close proximity to the distal common bile duct. No evidence for pancreatic duct dilatation. No acute pancreatic inflammation. Spleen: Normal in size without focal abnormality. Adrenals/Urinary Tract: Normal adrenal glands. Negative for kidney stones. Decreased enhancement to the right kidney and right kidney has decreased in size since 2024 and most likely associated with the high-grade stenosis involving the origin of the right renal artery. Bilateral renal cysts that do not require dedicated follow-up. No hydronephrosis. Normal appearance of the urinary bladder. Stomach/Bowel: Normal appendix.  Colonic diverticula without acute bowel inflammation. No bowel dilatation or obstruction. Normal appearance of the stomach. Lymphatic: No significant lymph node enlargement in the abdomen or pelvis. Reproductive: Status post hysterectomy. No adnexal masses. Other: Negative for free fluid. Negative for free air. Tiny umbilical hernia containing fat. Musculoskeletal: Chronic compression deformity involving the superior endplate of L1. No acute bone abnormality. IMPRESSION: VASCULAR 1. Endovascular reconstruction of the aortic bifurcation with placement of aortic and iliac artery stent grafts. Bilateral iliac arteries are widely patent. 2. 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 Distal right renal artery is patent but there is decreased enhancement in the right kidney and interval atrophy in the right kidney compatible with a high-grade stenosis. Based on a clinic note from 03/10/2024, Dr. Sheree is already aware of this finding in the right renal artery. NON-VASCULAR 1. No acute abnormality in the abdomen or pelvis. Electronically Signed: By: Juliene Balder M.D. On: 03/11/2024 09:37   MR FOOT RIGHT WO CONTRAST Result Date: 03/10/2024 CLINICAL DATA:  Foot pain and swelling. History of transmetatarsal amputation 01/31/2024. EXAM: MRI OF THE RIGHT FOREFOOT WITHOUT CONTRAST TECHNIQUE:  Multiplanar, multisequence MR imaging of the right foot was performed. No intravenous contrast was administered. COMPARISON:  Radiographs 03/09/2024. FINDINGS: Surgical changes from recent transmetatarsal amputation. Suspect open wound along the dorsum of the foot in the region of the first metatarsal amputation. Possible second small wound noted over the fifth metatarsal amputation site. Abnormal complex fluid collection noted overlying the metatarsal amputation sites likely all communicating. This is most notable over the first metatarsal were findings are highly suspicious a focal abscess and draining open wound. This measures approximately 2.4 x 1.5 cm. Smaller fluid collections over the second, third, fourth and fifth metatarsal amputation sites. There are also T1 and T2 signal changes at the osteotomy sites highly suspicious for osteomyelitis. On the T1 weighted coronal sequence I suspect is a few dots of gas near the first metatarsal. Diffuse subcutaneous soft tissue swelling/edema/fluid mainly along the dorsum of the foot consistent with cellulitis. There is also myofasciitis without definite findings for pyomyositis. The midfoot bony structures are intact. IMPRESSION: 1. Surgical changes from recent transmetatarsal amputation. 2. Suspect open wounds along the dorsum of the foot in the region of the metatarsal amputation sites. 3. Abnormal complex fluid collections noted along the metatarsal amputation sites consistent with postoperative abscesses. 4. T1 and T2 signal changes at the osteotomy sites highly suspicious for osteomyelitis. 5. Diffuse subcutaneous soft tissue swelling/edema/fluid mainly along the dorsum of the foot consistent with cellulitis. 6. Myofasciitis without definite findings for pyomyositis. Electronically Signed   By: MYRTIS Stammer M.D.   On: 03/10/2024 14:52   DG Foot Complete Right Result Date: 03/09/2024 EXAM: 3 VIEW(S) XRAY OF THE RIGHT FOOT 03/09/2024 10:56:39 PM COMPARISON:  Comparison with 02/20/2024. CLINICAL HISTORY: Osteo? Osteo? Swelling, pain, and oozing. FINDINGS: BONES AND JOINTS: Previous right forefoot amputation at the mid metatarsal area. Bony margins of resection appear intact. No developing cortical erosion or sclerosis. Mild callus formation at the 1st metatarsal stump. Joint spaces are normal. No acute fracture or dislocation. SOFT TISSUES: Mild soft tissue gas demonstrated in the dorsal soft tissues. Focal defect in the soft tissues over the stump, possibly ulceration and infection with gas-forming organism. No radiopaque soft tissue foreign bodies. IMPRESSION: 1. Mild soft tissue gas in the dorsal soft tissues with a focal defect over the stump, possibly representing ulceration and infection with a gas-forming organism.  2. Status post right forefoot amputation at the mid metatarsals with intact bony margins and no radiographic evidence of osteomyelitis. Electronically signed by: Elsie Gravely MD 03/09/2024 11:04 PM EST RP Workstation: [id number]   CT Cervical Spine Wo Contrast Result Date: 03/09/2024 EXAM: CT CERVICAL SPINE WITHOUT CONTRAST 03/09/2024 10:21:34 PM TECHNIQUE: CT of the cervical spine was performed without the administration of intravenous contrast. Multiplanar reformatted images are provided for review. Automated exposure control, iterative reconstruction, and/or weight based adjustment of the mA/kV was utilized to reduce the radiation dose to as low as reasonably achievable. COMPARISON: None available. CLINICAL HISTORY: Neck trauma (Age >= 65y) FINDINGS: BONES AND ALIGNMENT: No acute fracture or traumatic malalignment. DEGENERATIVE CHANGES: No significant degenerative changes. SOFT TISSUES: Multiple bilateral thyroid nodules. These have been previously evaluated by ultrasound. No prevertebral soft tissue swelling. IMPRESSION: 1. No evidence of acute traumatic injury. Electronically signed by: Franky Crease MD 03/09/2024 10:28 PM EST RP Workstation:  [id number]   CT Head Wo Contrast Result Date: 03/09/2024 EXAM: CT HEAD WITHOUT 03/09/2024 10:21:34 PM TECHNIQUE: CT of the head was performed without the administration of intravenous contrast. Automated exposure control, iterative reconstruction, and/or weight based adjustment of the mA/kV was utilized to reduce the radiation dose to as low as reasonably achievable. COMPARISON: 03/03/2023 CLINICAL HISTORY: headache, hit head on refrigerator FINDINGS: BRAIN AND VENTRICLES: No acute intracranial hemorrhage. No mass effect or midline shift. No extra-axial fluid collection. No evidence of acute infarct. No hydrocephalus. ORBITS: No acute abnormality. SINUSES AND MASTOIDS: No acute abnormality. SOFT TISSUES AND SKULL: No acute skull fracture. No acute soft tissue abnormality. IMPRESSION: 1. No acute intracranial abnormality. Electronically signed by: Franky Crease MD 03/09/2024 10:26 PM EST RP Workstation: [id number]   DG Chest Portable 1 View Result Date: 03/09/2024 EXAM: 1 VIEW(S) XRAY OF THE CHEST 03/09/2024 10:00:00 PM COMPARISON: 02/22/2024 CLINICAL HISTORY: cough FINDINGS: LUNGS AND PLEURA: No focal pulmonary opacity. No pleural effusion. No pneumothorax. HEART AND MEDIASTINUM: Aortic atherosclerosis. No acute abnormality of the cardiac and mediastinal silhouettes. BONES AND SOFT TISSUES: No acute osseous abnormality. IMPRESSION: 1. No active cardiopulmonary disease. Electronically signed by: Franky Crease MD 03/09/2024 10:07 PM EST RP Workstation: [id number]   DG Foot Complete Right Result Date: 02/20/2024 Please see detailed radiograph report in office note.   Microbiology: Results for orders placed or performed during the hospital encounter of 03/09/24  Urine Culture     Status: Abnormal   Collection Time: 03/09/24 10:27 PM   Specimen: Urine, Clean Catch  Result Value Ref Range Status   Specimen Description URINE, CLEAN CATCH  Final   Special Requests   Final    NONE Performed at [facility], [address] [place], [place] [address]    Culture 30,000 COLONIES/mL YEAST (A)  Final   Report Status 03/11/2024 FINAL  Final  Resp panel by RT-PCR (RSV, Flu A&B, Covid) Anterior Nasal Swab     Status: None   Collection Time: 03/09/24 10:57 PM   Specimen: Anterior Nasal Swab  Result Value Ref Range Status   SARS Coronavirus 2 by RT PCR NEGATIVE NEGATIVE Final   Influenza A by PCR NEGATIVE NEGATIVE Final   Influenza B by PCR NEGATIVE NEGATIVE Final    Comment: (NOTE) The Xpert Xpress SARS-CoV-2/FLU/RSV plus assay is intended as an aid in the diagnosis of influenza from Nasopharyngeal swab specimens and should not be used as a sole basis for treatment. Nasal washings and aspirates are unacceptable for Xpert Xpress SARS-CoV-2/FLU/RSV testing.  Fact  Sheet for Patients: bloggercourse.com  Fact Sheet for Healthcare Providers: seriousbroker.it  This test is not yet approved or cleared by the [place]  FDA and has been authorized for detection and/or diagnosis of SARS-CoV-2 by FDA under an Emergency Use Authorization (EUA). This EUA will remain in effect (meaning this test can be used) for the duration of the COVID-19 declaration under Section 564(b)(1) of the Act, 21 U.S.C. section 360bbb-3(b)(1), unless the authorization is terminated or revoked.     Resp Syncytial Virus by PCR NEGATIVE NEGATIVE Final    Comment: (NOTE) Fact Sheet for Patients: bloggercourse.com  Fact Sheet for Healthcare Providers: seriousbroker.it  This test is not yet approved or cleared by the [place]  FDA and has been authorized for detection and/or diagnosis of SARS-CoV-2 by FDA under an Emergency Use Authorization (EUA). This EUA will remain in effect (meaning this test can be used) for the duration of the COVID-19 declaration under Section 564(b)(1) of the Act, 21 U.S.C. section  360bbb-3(b)(1), unless the authorization is terminated or revoked.  Performed at [facility], [address] [place], [place] [address]   Blood culture (routine x 2)     Status: None   Collection Time: 03/09/24 11:16 PM   Specimen: BLOOD RIGHT HAND  Result Value Ref Range Status   Specimen Description BLOOD RIGHT HAND  Final   Special Requests   Final    BOTTLES DRAWN AEROBIC AND ANAEROBIC Blood Culture results may not be optimal due to an inadequate volume of blood received in culture bottles   Culture   Final    NO GROWTH 5 DAYS Performed at [facility], [address] [place], [place] [address]    Report Status 03/15/2024 FINAL  Final  Blood culture (routine x 2)     Status: None   Collection Time: 03/09/24 11:47 PM   Specimen: BLOOD RIGHT HAND  Result Value Ref Range Status   Specimen Description BLOOD RIGHT HAND  Final   Special Requests   Final    BOTTLES DRAWN AEROBIC ONLY Blood Culture adequate volume   Culture   Final    NO GROWTH 5 DAYS Performed at [facility], [address], [place], [place] [address]    Report Status 03/15/2024 FINAL  Final  Aerobic/Anaerobic Culture w Gram Stain (surgical/deep wound)     Status: None (Preliminary result)   Collection Time: 03/13/24 10:06 AM   Specimen: Foot, Right; Wound  Result Value Ref Range Status   Specimen Description WOUND  Final   Special Requests FOOT,RIGHT  Final   Gram Stain   Final    FEW WBC PRESENT, PREDOMINANTLY PMN MODERATE SQUAMOUS EPITHELIAL CELLS PRESENT ABUNDANT GRAM POSITIVE COCCI    Culture   Final    ABUNDANT STAPHYLOCOCCUS EPIDERMIDIS HOLDING FOR POSSIBLE ANAEROBE Performed at [facility], [address], [place], [place] [address]    Report Status PENDING  Incomplete   Organism ID, Bacteria STAPHYLOCOCCUS EPIDERMIDIS  Final      Susceptibility   Staphylococcus epidermidis - MIC*    CIPROFLOXACIN <=0.5 SENSITIVE Sensitive     ERYTHROMYCIN >=8 RESISTANT Resistant      GENTAMICIN <=0.5 SENSITIVE Sensitive     OXACILLIN >=4 RESISTANT Resistant     TETRACYCLINE >=16 RESISTANT Resistant     VANCOMYCIN  1 SENSITIVE Sensitive     TRIMETH /SULFA  <=10 SENSITIVE Sensitive     CLINDAMYCIN >=8 RESISTANT Resistant     RIFAMPIN <=0.5 SENSITIVE Sensitive  Inducible Clindamycin NEGATIVE Sensitive     * ABUNDANT STAPHYLOCOCCUS EPIDERMIDIS    Labs: CBC: Recent Labs  Lab 03/10/24 0437 03/11/24 0016 03/11/24 1606 03/12/24 0345 03/14/24 0417 03/15/24 0337  WBC 8.1 8.3 11.1* 11.7* 6.7 6.4  NEUTROABS 5.1  --   --   --   --   --   HGB 13.6 12.3 15.1* 11.6* 11.5* 10.4*  HCT 37.0 35.8* 44.3 34.1* 34.6* 32.0*  MCV 89.2 93.0 92.9 94.5 96.6 98.2  PLT 244 232 202 174 187 204   Basic Metabolic Panel: Recent Labs  Lab 03/09/24 2226 03/09/24 2316 03/10/24 0437 03/11/24 0016 03/11/24 1606 03/12/24 0345 03/13/24 0812 03/14/24 0417 03/15/24 0337  NA 121*  --  130* 132*  --  126* 130* 134* 134*  K 3.1*   < > 2.7* 3.5  --  3.5 3.2* 3.4* 4.2  CL 86*  --  92* 101  --  97* 98 103 104  CO2 21*  --  24 22  --  19* 20* 21* 18*  GLUCOSE 492*  --  136* 175*  --  202* 107* 77 120*  BUN 6*  --  5* 7*  --  13 10 10 8   CREATININE 1.02*  --  0.93 1.01* 1.01* 1.57* 1.21* 1.21* 0.98  CALCIUM 9.1  --  9.5 8.5*  --  8.6* 9.1 9.0 9.0  MG 1.9  --  1.9 1.8  --   --   --   --   --    < > = values in this interval not displayed.   Liver Function Tests: Recent Labs  Lab 03/10/24 0437 03/14/24 0417 03/15/24 0337  AST 16 22 26   ALT 7 7 10   ALKPHOS 155* 116 133*  BILITOT 0.8 0.3 0.4  PROT 7.9 6.3* 6.3*  ALBUMIN  4.1 3.3* 3.1*   CBG: Recent Labs  Lab 03/14/24 2048 03/15/24 0040 03/15/24 0355 03/15/24 0841 03/15/24 1208  GLUCAP 109* 108* 114* 158* 119*    Discharge time spent: less than 30 minutes.  Signed: Garnette Pelt, MD [facility] 03/15/2024 "

## 2024-03-14 NOTE — Anesthesia Postprocedure Evaluation (Signed)
"   Anesthesia Post Note  Patient: Brandy Hall  Procedure(s) Performed: REVISION TRANSMETATARSAL  RIGHT FOOT AMPUTATION  DBRIDEMENT WITH GRAFT (Right: Toe)     Patient location during evaluation: PACU Anesthesia Type: Regional and MAC Level of consciousness: awake and alert Pain management: pain level controlled Vital Signs Assessment: post-procedure vital signs reviewed and stable Respiratory status: spontaneous breathing, nonlabored ventilation, respiratory function stable and patient connected to nasal cannula oxygen Cardiovascular status: stable and blood pressure returned to baseline Postop Assessment: no apparent nausea or vomiting Anesthetic complications: no   There were no known notable events for this encounter.  Last Vitals:  Vitals:   03/14/24 1218 03/14/24 1630  BP: 103/70 117/67  Pulse: 96 89  Resp: 16 16  Temp: 37.1 C 37.1 C  SpO2: 100% 100%    Last Pain:  Vitals:   03/14/24 1630  TempSrc: Oral  PainSc:                  [name]      "

## 2024-03-14 NOTE — Progress Notes (Addendum)
 " Progress Note   Patient: Brandy Hall FMW:[id number] DOB: 1960/11/18 DOA: 03/09/2024     4 DOS: the patient was seen and examined on 03/14/2024   Brief hospital course: 64 year old female with diabetes mellitus type 2,  diabetic peripheral neuropathy, right-sided diabetic foot wound status post TMA on 01/31/2024, COPD, HTN, HLP, severely noncompliant presented to ED complaining of polyuria, feeling sick, somewhat confused, dizziness, dysuria for the last 4 days..  Patient reported that she has not been taking any of her medications for last several days including insulin .  Patient was hospitalized 01/29/2024-02/06/2024 for sepsis and gangrene of toe of right foot, underwent TMA on 11/26 with podiatry, Dr. Malvin.  She was discharged to SNF, then subsequently discharged from SNF back to home.   In the last 2 to 3 days, also noted increasing swelling associated with right TMA wound, increasing erythema and drainage.  Per chart review, she had recently completed a course of Augmentin  and finishing a course of Macrobid  for UTI at this time   In ED, initial CBG 554, BHB 0.08, VBG pH 7.37.  Sodium 121, potassium 3.1, creatinine 1.0. UA negative.  UDS negative.  Initial lactate 2.7-> 2.9   Patient was started on IV antibiotics and admitted for further workup.  Assessment and Plan: Right foot diabetic foot infection (HCC) with cellulitis, in the setting of recent TMA -Concern for potential postoperative wound infection in the setting of noncompliance, hyperglycemia poor wound care - MRI foot with postop abscesses noted along the metatarsal amputation sites, T1 and T2 signal changes at osteotomy sites highly suspicious for osteomyelitis, cellulitis -Vascular surgery consulted, underwent angiogram for right renal stenosis and already on 1/5 -Podiatry following, now s/p right foot TMA revision - Continued IV linezolid , cefepime , plan to narrow coverage -Per Podiatry, rec for non-weightbearing. PT  consulted, recs for SNF -Spent ample time with Case Manager in room discussing recommendation for SNF. Shared family's concern over lack of 24/7 supervision at home and pt's increased weakness. Despite long discussion, pt still insistent on discharging home and not SNF. -Pt is alert and oriented, demonstrated insight to her care.     Diabetes mellitus type 2, IDDM, uncontrolled with hyperglycemia, severely noncompliant - Presented with CBG of 554, hemoglobin A1c 10.0 - cont Lantus  12 units daily, NovoLog  meal coverage 4 units 3 times daily AC, moderate SSI  - Diabetic coordinator consulted    Lactic acidosis - Likely due to dehydration, no sepsis.  Improved with IV fluids   Hyponatremia - Likely pseudohyponatremia due to hyperglycemia on admission, NA 121.   - Sodium now improved   Acute kidney injury -  creatinine improved with hydration   Severe hypokalemia - Improved, replace as needed     Tobacco abuse -Counseled on smoking cessation, continue nicotine  patch      COPD (chronic obstructive pulmonary disease) (HCC) -Currently no acute wheezing, continue albuterol  nebs as needed   Hypertension, CAD - BP initially soft,  Lopressor , amlodipine  held -continue plavix   L arm DVT -L arm swelling noted overnight -LUE doppler noted DVT in L radial as well as superifcial clot in cephalic v -start therapeutic lovenox  for now   Subjective: Hoping to go home soon  Physical Exam: Vitals:   03/13/24 2316 03/14/24 0406 03/14/24 1218 03/14/24 1630  BP: 126/70 116/64 103/70 117/67  Pulse: (!) 108 81 96 89  Resp: 20 14 16 16   Temp: 98.4 F (36.9 C) 98.6 F (37 C) 98.7 F (37.1 C) 98.8 F (37.1 C)  TempSrc: Oral Oral Oral Oral  SpO2: 100% 99% 100% 100%  Weight:  78.7 kg    Height:       General exam: Conversant, in no acute distress Respiratory system: normal chest rise, clear, no audible wheezing Cardiovascular system: regular rhythm, s1-s2 Gastrointestinal system:  Nondistended, nontender, pos BS Central nervous system: No seizures, no tremors Extremities: No cyanosis, no joint deformities, L arm swollen Skin: No rashes, no pallor Psychiatry: Affect normal // no auditory hallucinations   Data Reviewed:  Labs reviewed: Na 134, K 3.4, Cr 1.21, WBC 6.7, Hgb 11.5, Plts 187  Family Communication: Pt in room, pt's daughter over phone  Disposition: Status is: Inpatient Remains inpatient appropriate because: severity of illness  Planned Discharge Destination: Home    Author: Garnette Pelt, MD 03/14/2024 6:11 PM  For on call review [email or web address].  "

## 2024-03-15 ENCOUNTER — Telehealth (HOSPITAL_COMMUNITY): Payer: Self-pay

## 2024-03-15 ENCOUNTER — Other Ambulatory Visit (HOSPITAL_COMMUNITY): Payer: Self-pay

## 2024-03-15 DIAGNOSIS — E11628 Type 2 diabetes mellitus with other skin complications: Secondary | ICD-10-CM | POA: Diagnosis not present

## 2024-03-15 DIAGNOSIS — L03031 Cellulitis of right toe: Secondary | ICD-10-CM | POA: Diagnosis not present

## 2024-03-15 DIAGNOSIS — L089 Local infection of the skin and subcutaneous tissue, unspecified: Secondary | ICD-10-CM | POA: Diagnosis not present

## 2024-03-15 LAB — COMPREHENSIVE METABOLIC PANEL WITH GFR
ALT: 10 U/L (ref 0–44)
AST: 26 U/L (ref 15–41)
Albumin: 3.1 g/dL — ABNORMAL LOW (ref 3.5–5.0)
Alkaline Phosphatase: 133 U/L — ABNORMAL HIGH (ref 38–126)
Anion gap: 12 (ref 5–15)
BUN: 8 mg/dL (ref 8–23)
CO2: 18 mmol/L — ABNORMAL LOW (ref 22–32)
Calcium: 9 mg/dL (ref 8.9–10.3)
Chloride: 104 mmol/L (ref 98–111)
Creatinine, Ser: 0.98 mg/dL (ref 0.44–1.00)
GFR, Estimated: >60 mL/min
Glucose, Bld: 120 mg/dL — ABNORMAL HIGH (ref 70–99)
Potassium: 4.2 mmol/L (ref 3.5–5.1)
Sodium: 134 mmol/L — ABNORMAL LOW (ref 135–145)
Total Bilirubin: 0.4 mg/dL (ref 0.0–1.2)
Total Protein: 6.3 g/dL — ABNORMAL LOW (ref 6.5–8.1)

## 2024-03-15 LAB — GLUCOSE, CAPILLARY
Glucose-Capillary: 108 mg/dL — ABNORMAL HIGH (ref 70–99)
Glucose-Capillary: 114 mg/dL — ABNORMAL HIGH (ref 70–99)
Glucose-Capillary: 119 mg/dL — ABNORMAL HIGH (ref 70–99)
Glucose-Capillary: 158 mg/dL — ABNORMAL HIGH (ref 70–99)

## 2024-03-15 LAB — CBC
HCT: 32 % — ABNORMAL LOW (ref 36.0–46.0)
Hemoglobin: 10.4 g/dL — ABNORMAL LOW (ref 12.0–15.0)
MCH: 31.9 pg (ref 26.0–34.0)
MCHC: 32.5 g/dL (ref 30.0–36.0)
MCV: 98.2 fL (ref 80.0–100.0)
Platelets: 204 K/uL (ref 150–400)
RBC: 3.26 MIL/uL — ABNORMAL LOW (ref 3.87–5.11)
RDW: 15.6 % — ABNORMAL HIGH (ref 11.5–15.5)
WBC: 6.4 K/uL (ref 4.0–10.5)
nRBC: 0 % (ref 0.0–0.2)

## 2024-03-15 LAB — CULTURE, BLOOD (ROUTINE X 2)
Culture: NO GROWTH
Culture: NO GROWTH
Special Requests: ADEQUATE

## 2024-03-15 MED ORDER — SULFAMETHOXAZOLE-TRIMETHOPRIM 800-160 MG PO TABS
1.0000 | ORAL_TABLET | Freq: Two times a day (BID) | ORAL | 0 refills | Status: AC
  Filled 2024-03-15: qty 6, 3d supply, fill #0

## 2024-03-15 MED ORDER — SULFAMETHOXAZOLE-TRIMETHOPRIM 800-160 MG PO TABS
1.0000 | ORAL_TABLET | Freq: Two times a day (BID) | ORAL | Status: DC
  Administered 2024-03-15: 1 via ORAL
  Filled 2024-03-15 (×2): qty 1

## 2024-03-15 MED ORDER — APIXABAN 2.5 MG PO TABS: 10.0000 mg | ORAL_TABLET | Freq: Two times a day (BID) | ORAL | Status: DC

## 2024-03-15 MED ORDER — APIXABAN 2.5 MG PO TABS: 5.0000 mg | ORAL_TABLET | Freq: Two times a day (BID) | ORAL | Status: DC

## 2024-03-15 MED ORDER — APIXABAN (ELIQUIS) VTE STARTER PACK (10MG AND 5MG)
ORAL_TABLET | ORAL | 0 refills | Status: AC
  Filled 2024-03-15: qty 74, 30d supply, fill #0

## 2024-03-15 NOTE — Telephone Encounter (Signed)
 Pharmacy Patient Advocate Encounter  Insurance verification completed.    The patient is insured through [employer]. Patient has Medicare and is not eligible for a copay card, but may be able to apply for patient assistance or Medicare RX Payment Plan (Patient Must reach out to their plan, if eligible for payment plan), if available.    Ran test claim for Eliquis  5mg  tablet and the current 30 day co-pay is $40.  Ran test claim for Xarelto 20mg  tablet and the current 30 day co-pay is $40.  This test claim was processed through [employer]- copay amounts may vary at other pharmacies due to [employer], or as the patient moves through the different stages of their insurance plan.

## 2024-03-15 NOTE — Progress Notes (Signed)
 Physical Therapy Treatment Patient Details Name: Brandy Hall MRN: [id number] DOB: 09-Jan-1961 Today's Date: 03/15/2024   History of Present Illness 64 y.o. female presents to [facility] hospital on 03/09/2024 with hypergylcemia. S/P R renal artery stent on 03/13/2024. Pt underwent R foot TMA revision on 1/05. DVT was indicated on 1/8. PMH includes CAD, PAD, COPD, HTN, HLD, Trigeminal neuralgia, GERD and s/p R TMA 01/31/2024.    PT Comments   Currently, pt requires supervision for bed mobility and mod assist x2 for transfers and is unable to transfer safely w/o adhering to precautions. Pt can perform a partial sit to stand w/ mod assist x2 but unable to fully do a sit to stand w/ adherence to precautions.This is due to deficits of limited BUE strength and not following R NWB precautions. Pt needed frequent reminders to be R NWB and pt states that they are able to perform the necessary transfer from bed to [facility] w/o an AD and when therapist prompted pt to perform the task, the pt was unable to successfully problem solve transfering safely from bed to [facility] while adhering to precautions. Therapist communicated to pt the risk of not following NWB precautions and the importance of safe transfers to avoid reinjury to self and injury to family. Pt is continuously stating they are able to do it once they get stronger but is unable to perform the necessary transfers w/o a 2 person assist for a safe d/c. Pt states that family will have to pick up pt to get inside the house and assist with the necessary ADLs. Pt states if they are not able to get to the bathroom, they will wait for family to be present to go to the bathroom. Therapist extensively communicated to pt the safety concerns of family transferring by picking up pt, the risk of UTIs from not using the bathroom for long periods, and not adhering to NWB precautions. Although, the therapist provided active listening and collaboration in their care, the pt was unreceptive to  the risks involved with her d/c home. At this time, pt would benefit from short-term inpatient rehab <3hrs/day to improve functional level of independence and facilitate safe d/c home. Will continue to follow acutely.    If plan is discharge home, recommend the following: Two people to help with walking and/or transfers;Two people to help with bathing/dressing/bathroom;Assistance with cooking/housework;Help with stairs or ramp for entrance;Assist for transportation   Can travel by private vehicle     No  Equipment Recommendations  BSC/3in1 (Drop arm)    Recommendations for Other Services       Precautions / Restrictions Precautions Precautions: Fall Recall of Precautions/Restrictions: Intact Precaution/Restrictions Comments: NWB to RLE Required Braces or Orthoses: Other Brace Other Brace: R post op shoe Restrictions Weight Bearing Restrictions Per Provider Order: Yes RLE Weight Bearing Per Provider Order: Non weight bearing     Mobility  Bed Mobility Overal bed mobility: Needs Assistance Bed Mobility: Supine to Sit     Supine to sit: Supervision, Used rails     General bed mobility comments: S for safety to adhere to NWB precautions    Transfers Overall transfer level: Needs assistance Equipment used: Rolling walker (2 wheels), None Transfers: Bed to chair/wheelchair/BSC, Sit to/from Stand Sit to Stand: Mod assist, +2 physical assistance, +2 safety/equipment          Lateral/Scoot Transfers: Contact guard assist, +2 safety/equipment General transfer comment: Pt initally standing with mod +2 assist, requiring tactile cues to maintain NWB, unable to take  pivotal steps for transfer.Pt unable to fully stand w/o maintaining precautions, pt needed total assist to hold her R leg off the ground. Pt CGA for lateral scoot to recliner. Pt was mod assist X2 for partial sit to stand and 5 reps.    Ambulation/Gait               General Gait Details: Attempted to hop and  cueing patient to place heavy weight through the walker and LLE and unable to hop even with max assistance.   Stairs             Wheelchair Mobility     Tilt Bed    Modified Rankin (Stroke Patients Only)       Balance Overall balance assessment: Needs assistance, History of Falls Sitting-balance support: Feet supported, No upper extremity supported Sitting balance-Leahy Scale: Normal Sitting balance - Comments: Can reach off center of gravity to manage post op shoe without LOB.   Standing balance support: Reliant on assistive device for balance, Bilateral upper extremity supported Standing balance-Leahy Scale: Poor Standing balance comment: reliant on UE and external support                            Communication Communication Communication: No apparent difficulties  Cognition Arousal: Alert Behavior During Therapy: WFL for tasks assessed/performed   PT - Cognitive impairments: No family/caregiver present to determine baseline                       PT - Cognition Comments: Pt needed frequent reminders to be NWB and therapist attempted to simulate going to the bathroom w/ the [facility] and pt unable to maintain transfer to toilet without maintaining precautions. Pt was adamant about being able to do transfers properly when at home yet unable to do it while here. Pt was unreceptive to therapy and would say this is not how I would do it and I dont want to do it this way Following commands: Impaired Following commands impaired: Follows one step commands inconsistently, Follows multi-step commands inconsistently (Therapist needed to frequently remind pt to pivot and transfer correctly while maintaining NWB precautions.)    Cueing Cueing Techniques: Verbal cues, Tactile cues, Visual cues  Exercises Other Exercises Other Exercises: Partial Sit to stand 5 reps mod assist x2    General Comments General comments (skin integrity, edema, etc.): VSS on RA; PT  attempted to contact daughter about safety conerns, WB precautions, and DME for transportation home but went to voicemail. Educated pt on need for someone to assist in carrying her up the stairs in her w/c, which she reported she had the support to do so.      Pertinent Vitals/Pain Pain Assessment Pain Assessment: 0-10 Pain Score: 4  Pain Location: R foot Pain Descriptors / Indicators: Operative site guarding, Discomfort Pain Intervention(s): Limited activity within patient's tolerance, Monitored during session, Repositioned    Home Living Family/patient expects to be discharged to:: Private residence Living Arrangements: Children                      Prior Function            PT Goals (current goals can now be found in the care plan section) Acute Rehab PT Goals Patient Stated Goal: to go home with family PT Goal Formulation: With patient Time For Goal Achievement: 03/28/24 Potential to Achieve Goals: Fair Progress towards PT goals: Progressing  toward goals    Frequency    Min 3X/week      PT Plan      Co-evaluation              AM-PAC PT 6 Clicks Mobility   Outcome Measure  Help needed turning from your back to your side while in a flat bed without using bedrails?: A Little Help needed moving from lying on your back to sitting on the side of a flat bed without using bedrails?: A Little Help needed moving to and from a bed to a chair (including a wheelchair)?: A Little Help needed standing up from a chair using your arms (e.g., wheelchair or bedside chair)?: Total Help needed to walk in hospital room?: Total Help needed climbing 3-5 steps with a railing? : Total 6 Click Score: 12    End of Session Equipment Utilized During Treatment: Gait belt Activity Tolerance: Patient limited by fatigue;Patient limited by pain Patient left: in chair;with call bell/phone within reach;with chair alarm set Nurse Communication: Mobility status (Notifed RN that  batteries were low for chair alarm and no batteries on unit.) PT Visit Diagnosis: Unsteadiness on feet (R26.81);Repeated falls (R29.6);History of falling (Z91.81);Difficulty in walking, not elsewhere classified (R26.2)     Time: 1015-1040 PT Time Calculation (min) (ACUTE ONLY): 25 min  Charges:    $Therapeutic Exercise: 8-22 mins $Therapeutic Activity: 8-22 mins PT General Charges $$ ACUTE PT VISIT: 1 Visit                     Murtis CHRISTELLA Ferries, SPT    Ginelle Bays 03/15/2024, 2:10 PM

## 2024-03-15 NOTE — Progress Notes (Signed)
 PHARMACY - ANTICOAGULATION CONSULT NOTE  Pharmacy Consult for Eliquis  Indication: DVT  Allergies[1]  Patient Measurements: Height: 5' 8 (172.7 cm) Weight: 78.7 kg (173 lb 8 oz) IBW/kg (Calculated) : 63.9 HEPARIN  DW (KG): 79.8  Vital Signs: Temp: 99 F (37.2 C) (01/09 1205) Temp Source: Oral (01/09 1205) BP: 102/90 (01/09 1205) Pulse Rate: 88 (01/09 1205)  Labs: Recent Labs    03/13/24 0812 03/14/24 0417 03/15/24 0337  HGB  --  11.5* 10.4*  HCT  --  34.6* 32.0*  PLT  --  187 204  CREATININE 1.21* 1.21* 0.98    Estimated Creatinine Clearance: 63.1 mL/min (by C-G formula based on SCr of 0.98 mg/dL).   Medical History: Past Medical History:  Diagnosis Date   Allergic rhinitis    COPD (chronic obstructive pulmonary disease) (HCC)    Diabetes (HCC)    GERD (gastroesophageal reflux disease)    History of colon polyps    Hypercholesteremia    Hypertension    IBS (irritable bowel syndrome)    MI (myocardial infarction) (HCC) 2016   Seasonal allergies    Trigeminal neuralgia     Assessment/Plan:  Eliquis  for DVT treatment. Last dose therapeutic lovenox  this morning. Lovenox  stopped and eliquis  10 mg BID starting tonight x 13 doses then 5 mg BID after.  Larraine Brazier, PharmD Clinical Pharmacist 03/15/2024  2:09 PM **Pharmacist phone directory can now be found on [email or web address] ([email or web address]).  Listed under [facility].       [1]  Allergies Allergen Reactions   Crestor [Rosuvastatin] Other (See Comments)    Myalgias    Percocet [Oxycodone -Acetaminophen ] Hives   Statins Other (See Comments)    Per patient shuts down organs   Trulicity [Dulaglutide] Nausea And Vomiting, Nausea Only and Other (See Comments)    Pancreatitis

## 2024-03-15 NOTE — Progress Notes (Signed)
 Reviewed AVS, patient expressed understanding of medications, MD follow up reviewed.   Removed IV, Site clean, dry and intact.  See LDA for information on wounds at discharge. CCMD contacted and informed patients is being discharged.  Patient states all belongings brought to the hospital at time of admission are not accounted for and was misplaced while in the ED. Picked up medications from [facility] pharmacy. This nursing staff contacted to transport patient to Discharge lounge to wait for transportation home.

## 2024-03-15 NOTE — Progress Notes (Signed)
" °  Progress Note    03/15/2024 12:57 PM 2 Days Post-Op  Subjective: No overnight issues, wanting to go home  Vitals:   03/15/24 0844 03/15/24 1205  BP: 116/77 (!) 102/90  Pulse: 77 88  Resp: 18 16  Temp: 98.3 F (36.8 C) 99 F (37.2 C)  SpO2: 100% 100%    Physical Exam: Awake alert and oriented Left groin is soft nontender and no hematoma is present Palpable right posterior tibial pulse with dressing in place of the right foot  CBC    Component Value Date/Time   WBC 6.4 03/15/2024 0337   RBC 3.26 (L) 03/15/2024 0337   HGB 10.4 (L) 03/15/2024 0337   HGB 14.0 12/20/2023 1104   HCT 32.0 (L) 03/15/2024 0337   HCT 42.0 12/20/2023 1104   PLT 204 03/15/2024 0337   PLT 213 12/20/2023 1104   MCV 98.2 03/15/2024 0337   MCV 106 (H) 12/20/2023 1104   MCH 31.9 03/15/2024 0337   MCHC 32.5 03/15/2024 0337   RDW 15.6 (H) 03/15/2024 0337   RDW 13.2 12/20/2023 1104   LYMPHSABS 2.0 03/10/2024 0437   LYMPHSABS 1.0 12/20/2023 1104   MONOABS 0.8 03/10/2024 0437   EOSABS 0.1 03/10/2024 0437   EOSABS 0.0 12/20/2023 1104   BASOSABS 0.0 03/10/2024 0437   BASOSABS 0.0 12/20/2023 1104    BMET    Component Value Date/Time   NA 134 (L) 03/15/2024 0337   NA 140 12/20/2023 1104   K 4.2 03/15/2024 0337   CL 104 03/15/2024 0337   CO2 18 (L) 03/15/2024 0337   GLUCOSE 120 (H) 03/15/2024 0337   BUN 8 03/15/2024 0337   BUN 7 (L) 12/20/2023 1104   CREATININE 0.98 03/15/2024 0337   CALCIUM 9.0 03/15/2024 0337   GFRNONAA >60 03/15/2024 0337    INR    Component Value Date/Time   INR 0.9 03/11/2024 0016     Intake/Output Summary (Last 24 hours) at 03/15/2024 1257 Last data filed at 03/15/2024 0844 Gross per 24 hour  Intake --  Output 550 ml  Net -550 ml     Assessment:  64 y.o. female is s/p stenting right renal artery and right lower extremity angiography that did not require intervention, podiatry performed revision of her transmetatarsal amputation 2 days ago.   Plan: Okay  for discharge from vascular standpoint  We again discussed the need for complete smoking cessation  She will follow-up in 4 to 6 weeks with renal artery duplex and bilateral ABIs.   Brandy Latendresse C. Sheree, MD [facility] Office: [phone] Pager: [phone]  03/15/2024 12:57 PM  "

## 2024-03-16 LAB — AEROBIC/ANAEROBIC CULTURE W GRAM STAIN (SURGICAL/DEEP WOUND)

## 2024-03-18 LAB — SURGICAL PATHOLOGY

## 2024-03-21 ENCOUNTER — Telehealth: Payer: Self-pay | Admitting: Podiatry

## 2024-03-21 NOTE — Telephone Encounter (Signed)
 The patient asked her daughter to call and report that she is still experiencing significant pain. [place] is not providing relief. The patient is requesting that a pain medication be prescribed.  [id number] [place], [place] - [address] Phone: [phone]  Fax: [phone]

## 2024-03-22 ENCOUNTER — Other Ambulatory Visit: Payer: Self-pay | Admitting: Podiatry

## 2024-03-22 MED ORDER — TRAMADOL HCL 50 MG PO TABS: 50.0000 mg | ORAL_TABLET | Freq: Three times a day (TID) | ORAL | 0 refills | Status: AC | PRN

## 2024-03-22 NOTE — Telephone Encounter (Signed)
 Called Pt with update. Thank you

## 2024-03-26 ENCOUNTER — Ambulatory Visit: Admitting: Podiatry

## 2024-03-26 DIAGNOSIS — I96 Gangrene, not elsewhere classified: Secondary | ICD-10-CM

## 2024-03-26 DIAGNOSIS — Z9889 Other specified postprocedural states: Secondary | ICD-10-CM

## 2024-03-26 MED ORDER — CEPHALEXIN 500 MG PO CAPS: 500.0000 mg | ORAL_CAPSULE | Freq: Three times a day (TID) | ORAL | 0 refills | Status: AC

## 2024-03-26 NOTE — Progress Notes (Signed)
 "  Subjective:  Patient ID: Brandy Hall, female    DOB: 1960/03/31,  MRN: [id number]  No chief complaint on file.   DOS: 03/13/2024 Procedure: 1.  Revision transmetatarsal amputation with further resection of metatarsals 1 through 5, right foot 2.  Application amniotic allograft 5 x 5 cm, right foot  64 y.o. female seen for post op check.  Patient following up about 2 weeks from surgery.  Patient reports she has been doing well she has been keeping all the weight off the right foot.  She does note some increasing pain on the left foot as well as some redness and swelling.  It was worse previously and I saw a picture where the foot was quite red in the forefoot with some swelling.  Definitely improved from that today.  She reports burning and tingling pain in the right foot at the amputation site taking Lyrica  3 times a day.  Review of Systems: Negative except as noted in the HPI. Denies N/V/F/Ch.   Objective:   Constitutional Well developed. Well nourished.  Vascular Foot warm and well perfused. Capillary refill normal to all digits.   No calf pain with palpation  Neurologic Normal speech. Oriented to person, place, and time. Epicritic sensation diminished to right foot  Dermatologic Right foot transmetatarsal amputation site looking very good no evidence of dehiscence drainage maceration or erythema.  No necrosis at the incision line.   Very mild if any erythema on the left foot no open wounds present.  There is edema noted about the ankle.   Orthopedic: Status post right foot TMA revision slightly more proximal   Radiographs: 1. Status post forefoot amputation revision with expected postoperative changes.   Pathology: A. FOOT, RIGHT TRANSMETATARSAL, AMPUTATION:  Granulation tissue and osteomyelitis.  Osseous margin negative for osteomyelitis.   Micro: Staph epidermidis  Assessment:   1. Gangrene of right foot (HCC)   2. Post-operative state    Gangrenous changes and  osteomyelitis right foot TMA stump status post revision TMA more proximal with debridement of the necrotic skin and closure  Plan:  Patient was evaluated and treated and all questions answered.   2 weeks s/p right foot TMA revision -Progressing well postoperatively.   Amputation site looking very good on the right foot today no evidence of dehiscence or necrosis.  No evidence of residual infection there.  Continue to keep dry -She can increase her weightbearing status on the right foot to do some weightbearing in the postop shoe on the right foot -XR: Expected postoperative changes -WB Status: Weightbearing as tolerated in the postop shoe on the right foot -Sutures: Remain intact.  Will remove in 2 weeks -Medications/ABX: E Rx for cephalexin  given concern for some cellulitis on the left foot. -Dressing: Recommend once weekly dressing change with Xeroform 4 x 4 Kerlix Ace wrap to the right foot this can be done per home health nurse per the patient - F/u Plan: Patient to follow-up in 2 weeks  # Edema and mild cellulitis of the left foot. - Recommend patient go on a course of cephalexin  as above. - Explained that if the redness or swelling worsens and does not improve over the next 5 to 7 days she would need to go to the ED for further evaluation for recalcitrant cellulitis likely secondary to fluid overload and significant edema in the left lower extremity. - Recommend elevation of the left lower extremity above the heart level        Brandy Hall,  DPM [facility]  "

## 2024-03-28 ENCOUNTER — Telehealth: Payer: Self-pay | Admitting: Podiatry

## 2024-03-28 NOTE — Telephone Encounter (Signed)
 error

## 2024-04-02 ENCOUNTER — Other Ambulatory Visit: Payer: Self-pay

## 2024-04-02 DIAGNOSIS — I739 Peripheral vascular disease, unspecified: Secondary | ICD-10-CM

## 2024-04-02 DIAGNOSIS — I771 Stricture of artery: Secondary | ICD-10-CM

## 2024-04-11 ENCOUNTER — Encounter: Payer: Self-pay | Admitting: Podiatry

## 2024-04-11 ENCOUNTER — Ambulatory Visit: Admitting: Podiatry

## 2024-04-11 DIAGNOSIS — I96 Gangrene, not elsewhere classified: Secondary | ICD-10-CM

## 2024-04-11 DIAGNOSIS — Z9889 Other specified postprocedural states: Secondary | ICD-10-CM

## 2024-04-11 NOTE — Progress Notes (Signed)
"  °  Subjective:  Patient ID: Brandy Hall, female    DOB: [birth year],  MRN: [id number]  Chief Complaint  Patient presents with   Routine Post Op     TMA revision.6 pain. A1C 10.0     DOS: 03/13/2024 Procedure: 1.  Revision transmetatarsal amputation with further resection of metatarsals 1 through 5, right foot 2.  Application amniotic allograft 5 x 5 cm, right foot  64 y.o. female seen for post op check.  Patient coming in about 4 weeks out from surgery from suture removal to the right foot.  She was previously placed on cephalexin  for the left foot concern for possible cellulitis which is resolved.  She has no pain over there.  She does have some pain in the right foot she thinks is burning possibly neuro related pain.  Review of Systems: Negative except as noted in the HPI. Denies N/V/F/Ch.   Objective:   Constitutional Well developed. Well nourished.  Vascular Foot warm and well perfused. Capillary refill normal to all digits.   No calf pain with palpation  Neurologic Normal speech. Oriented to person, place, and time. Epicritic sensation diminished to right foot  Dermatologic Right foot transmetatarsal amputation site looking very good no evidence of dehiscence drainage maceration or erythema.  No necrosis at the incision line.         Orthopedic: Status post right foot TMA revision slightly more proximal   Radiographs: 1. Status post forefoot amputation revision with expected postoperative changes.   Pathology: A. FOOT, RIGHT TRANSMETATARSAL, AMPUTATION:  Granulation tissue and osteomyelitis.  Osseous margin negative for osteomyelitis.   Micro: Staph epidermidis  Assessment:   1. Gangrene of right foot (HCC)   2. Post-operative state     Gangrenous changes and osteomyelitis right foot TMA stump status post revision TMA more proximal with debridement of the necrotic skin and closure  Plan:  Patient was evaluated and treated and all questions answered.   4  weeks s/p right foot TMA revision -Progressing well postoperatively.   Amputation site looking very good on the right foot today no evidence of dehiscence or necrosis.  No evidence of residual infection there.    -XR: Expected postoperative changes -WB Status: Weightbearing as tolerated in the postop shoe on the right foot -Sutures: Removed in total today -Medications/ABX: No ABX  indicated -Dressing: Recommend once weekly dressing change with Xeroform 4 x 4 Kerlix Ace wrap to the right foot this can be done per home health nurse per the patient - F/u Plan: Patient to follow-up in 3 weeks  # Edema and mild cellulitis of the left foot. - Resolved        Brandy Hall, DPM [facility] / [facility]  "

## 2024-04-24 ENCOUNTER — Ambulatory Visit (HOSPITAL_COMMUNITY)

## 2024-04-24 ENCOUNTER — Encounter: Admitting: Pharmacist

## 2024-04-24 ENCOUNTER — Encounter: Admitting: Vascular Surgery

## 2024-05-09 ENCOUNTER — Encounter: Admitting: Podiatry
